# Patient Record
Sex: Female | Born: 1950 | Race: White | Hispanic: No | Smoking: Never smoker
Health system: Southern US, Community
[De-identification: ages and names within clinical notes are randomized; demographics above are authoritative.]

## PROBLEM LIST (undated history)

## (undated) DIAGNOSIS — R0602 Shortness of breath: Secondary | ICD-10-CM

## (undated) DIAGNOSIS — K5792 Diverticulitis of intestine, part unspecified, without perforation or abscess without bleeding: Secondary | ICD-10-CM

## (undated) DIAGNOSIS — B019 Varicella without complication: Secondary | ICD-10-CM

## (undated) DIAGNOSIS — I10 Essential (primary) hypertension: Secondary | ICD-10-CM

## (undated) DIAGNOSIS — R531 Weakness: Secondary | ICD-10-CM

## (undated) DIAGNOSIS — F32A Depression, unspecified: Secondary | ICD-10-CM

## (undated) DIAGNOSIS — R002 Palpitations: Secondary | ICD-10-CM

## (undated) DIAGNOSIS — R0600 Dyspnea, unspecified: Secondary | ICD-10-CM

## (undated) DIAGNOSIS — R42 Dizziness and giddiness: Secondary | ICD-10-CM

## (undated) DIAGNOSIS — R5383 Other fatigue: Secondary | ICD-10-CM

## (undated) DIAGNOSIS — K219 Gastro-esophageal reflux disease without esophagitis: Secondary | ICD-10-CM

## (undated) HISTORY — DX: Essential (primary) hypertension: I10

## (undated) HISTORY — DX: Palpitations: R00.2

## (undated) HISTORY — DX: Gastro-esophageal reflux disease without esophagitis: K21.9

## (undated) HISTORY — DX: Shortness of breath: R06.02

## (undated) HISTORY — DX: Depression, unspecified: F32.A

## (undated) HISTORY — DX: Weakness: R53.1

## (undated) HISTORY — DX: Other fatigue: R53.83

## (undated) HISTORY — DX: Dyspnea, unspecified: R06.00

## (undated) HISTORY — DX: Dizziness and giddiness: R42

## (undated) HISTORY — DX: Varicella without complication: B01.9

## (undated) HISTORY — DX: Diverticulitis of intestine, part unspecified, without perforation or abscess without bleeding: K57.92

---

## 1974-05-19 HISTORY — PX: APPENDECTOMY: SHX54

## 1974-05-19 HISTORY — PX: OTHER SURGICAL HISTORY: SHX169

## 2003-08-06 ENCOUNTER — Emergency Department (HOSPITAL_COMMUNITY): Admission: EM | Admit: 2003-08-06 | Discharge: 2003-08-06 | Payer: Self-pay

## 2004-05-22 ENCOUNTER — Ambulatory Visit (HOSPITAL_COMMUNITY): Admission: RE | Admit: 2004-05-22 | Discharge: 2004-05-22 | Payer: Self-pay | Admitting: Chiropractic Medicine

## 2005-03-09 IMAGING — CR DG ELBOW COMPLETE 3+V*R*
2 series · 2 of 2 positions shown · non-contrast
Comparison: none

CLINICAL DATA: Fall.  Pain over the posterior elbow.

[view not recorded (1 of 2)]
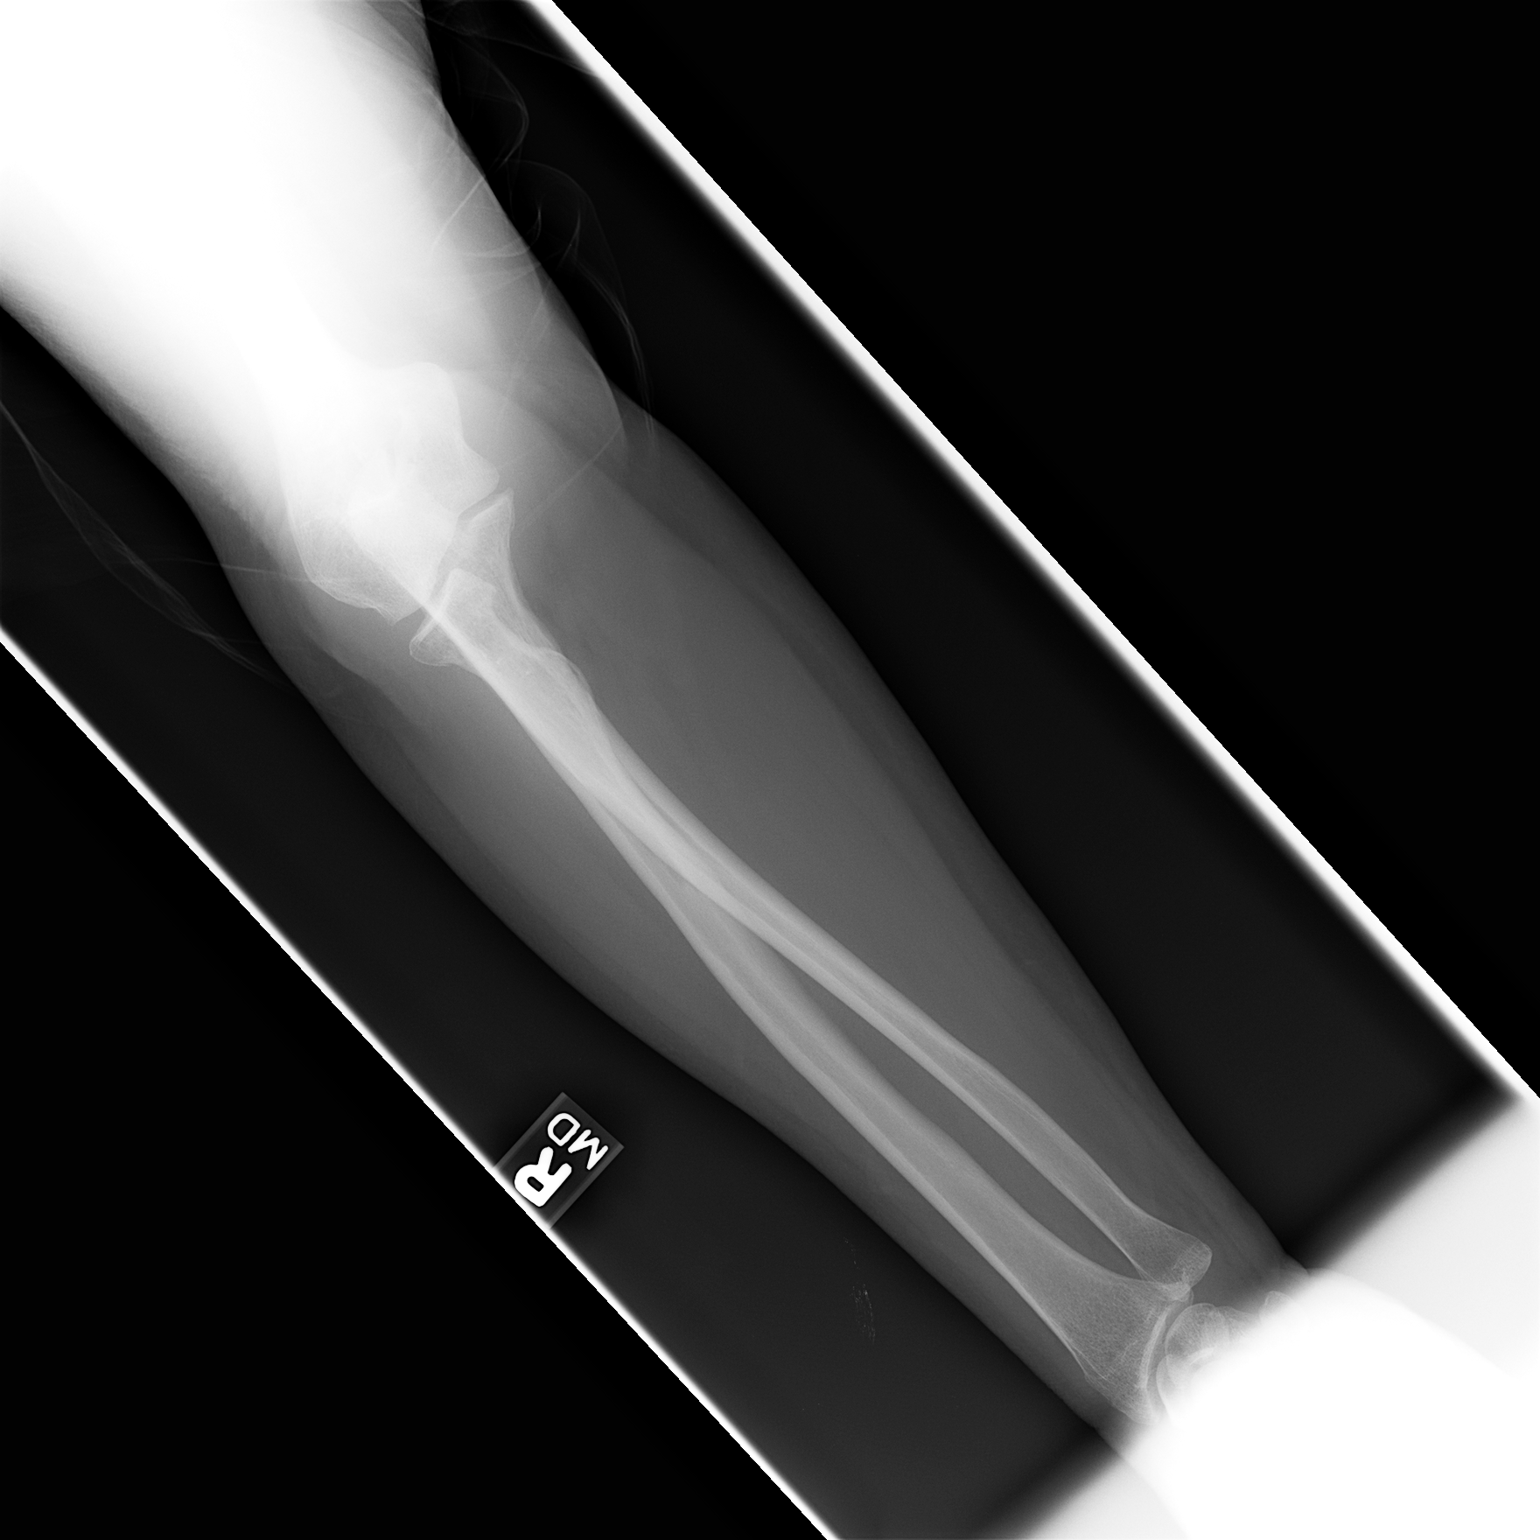

[view not recorded (2 of 2)]
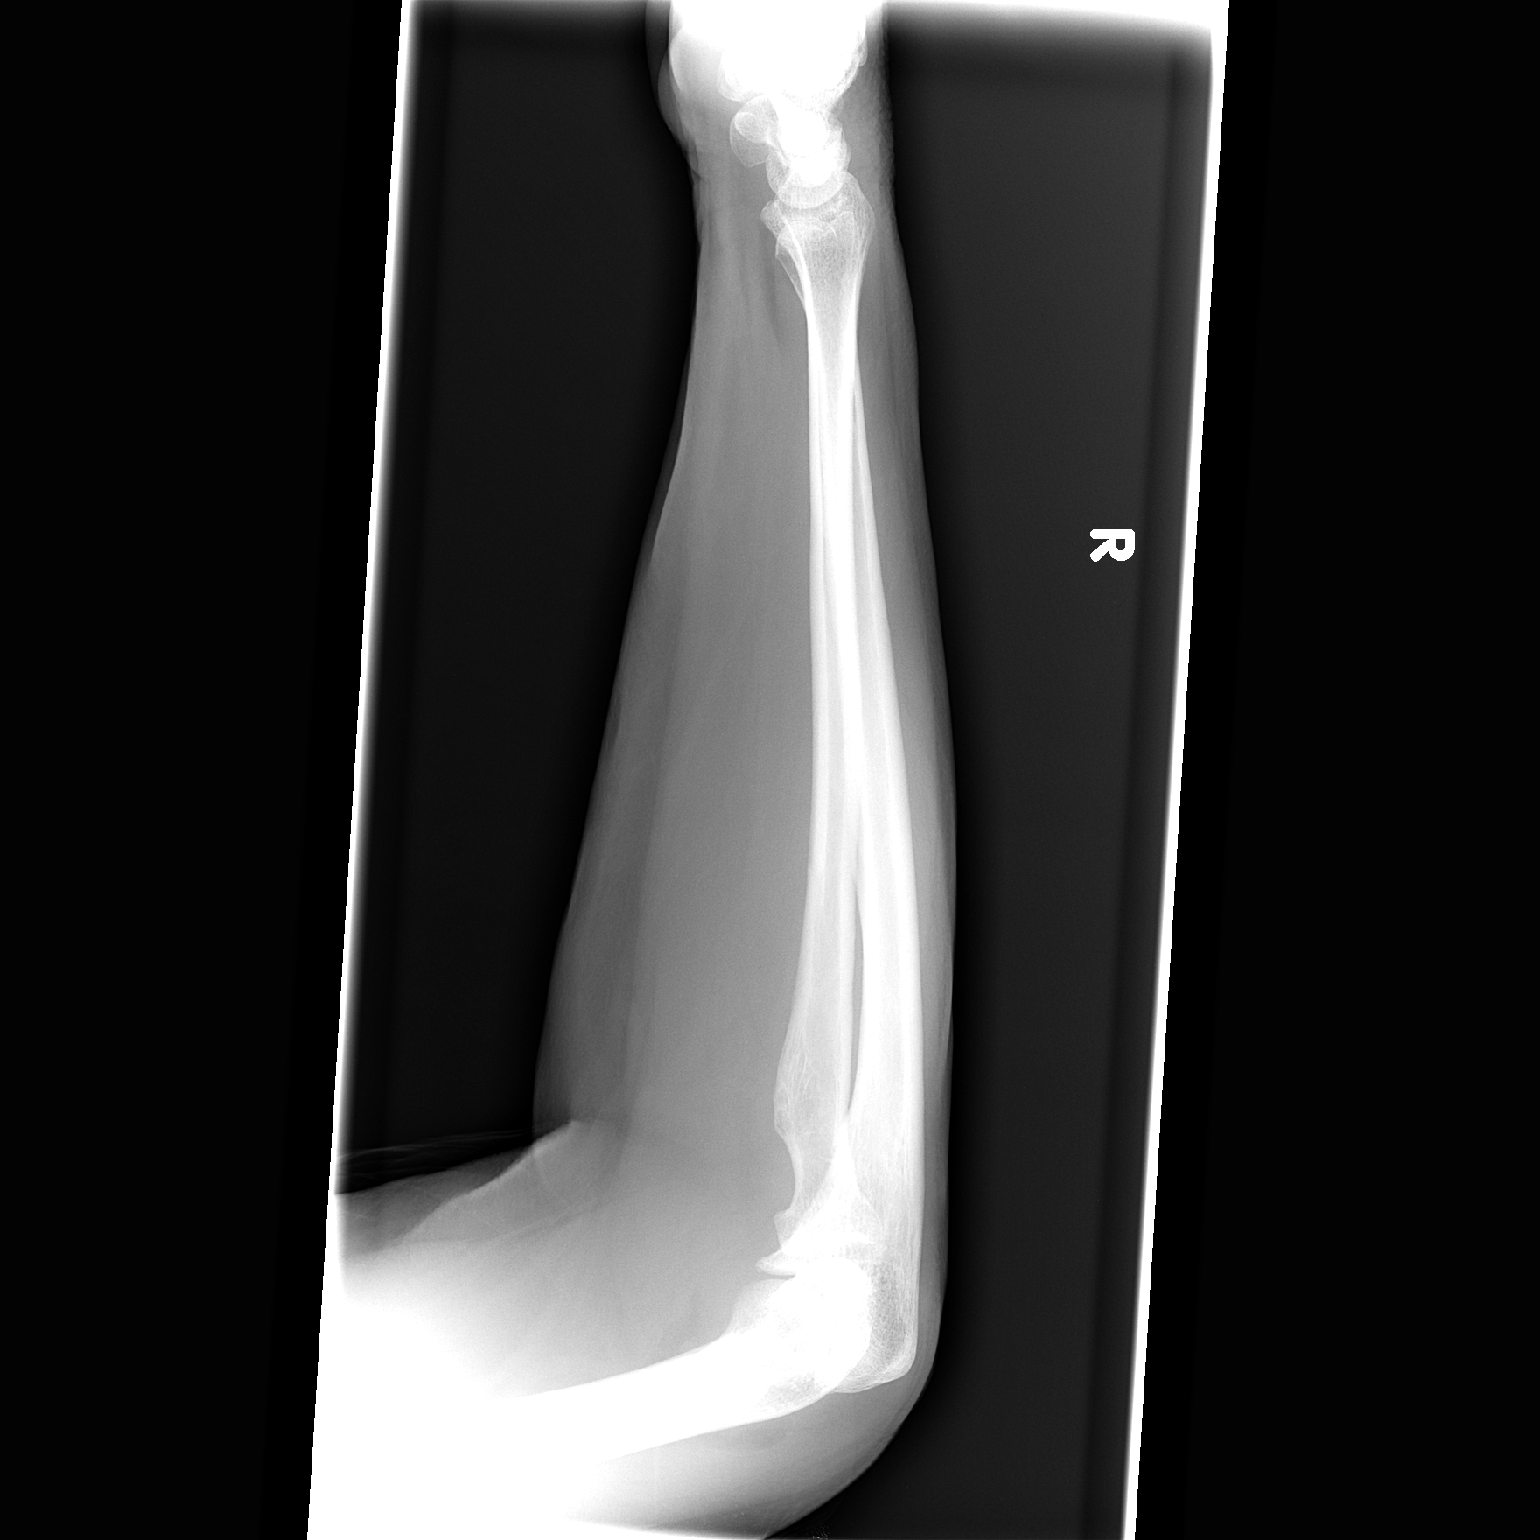

[2 of 2 positions shown; findings below may reference images not displayed]

RIGHT ELBOW
Three view exam of the right elbow was performed with multiple views repeated as the patient was unable to straighten her elbow.  There is a substantial joint effusion with elevation of both the anterior and posterior fat pads.  A nondistracted radial head fracture is apparent.  The distal humerus is not well evaluated secondary to the patient's inability to straighten her elbow. 

IMPRESSION
Radial head fracture with joint effusion. 

Study is limited by the patient's inability to straighten her elbow.  The distal humerus has not been well visualized.  Repeat imaging after pain management may prove helpful to exclude a distal humeral injury. 

RIGHT FOREARM
Two view exam of the right forearm again shows the nondisplaced fracture of the radial head.  No other acute fracture identified. 

IMPRESSION
Radial head fracture.   Please see elbow report above.  

[REDACTED]

## 2019-06-01 ENCOUNTER — Telehealth: Payer: Self-pay

## 2019-06-01 NOTE — Telephone Encounter (Signed)
REFERRAL AND LABS ON FILE FROM [facility]  [facility], SENT REFERRAL TO SCHEDULING

## 2019-06-10 ENCOUNTER — Encounter: Payer: Self-pay | Admitting: Cardiology

## 2019-06-10 ENCOUNTER — Other Ambulatory Visit: Payer: Self-pay

## 2019-06-10 ENCOUNTER — Telehealth: Payer: Self-pay | Admitting: Radiology

## 2019-06-10 ENCOUNTER — Ambulatory Visit: Payer: Medicare HMO | Admitting: Cardiology

## 2019-06-10 VITALS — BP 138/96 | HR 90 | Ht 69.0 in | Wt 229.0 lb

## 2019-06-10 DIAGNOSIS — T733XXA Exhaustion due to excessive exertion, initial encounter: Secondary | ICD-10-CM | POA: Diagnosis not present

## 2019-06-10 DIAGNOSIS — R002 Palpitations: Secondary | ICD-10-CM

## 2019-06-10 DIAGNOSIS — R42 Dizziness and giddiness: Secondary | ICD-10-CM

## 2019-06-10 NOTE — Telephone Encounter (Signed)
Enrolled patient for a 30 day [employer] Event monitor to be mailed to patients home.  

## 2019-06-10 NOTE — Patient Instructions (Signed)
Medication Instructions:  Your physician recommends that you continue on your current medications as directed. Please refer to the Current Medication list given to you today.  *If you need a refill on your cardiac medications before your next appointment, please call your pharmacy*  Testing/Procedures: Your physician has requested that you have an echocardiogram. Echocardiography is a painless test that uses sound waves to create images of your heart. It provides your doctor with information about the size and shape of your heart and how well your heart's chambers and valves are working. This procedure takes approximately one hour. There are no restrictions for this procedure.  Your physician has requested that you have a lexiscan myoview. For further information please visit [email or web address] Please follow instruction sheet, as given.  Your physician has recommended that you wear an event monitor. Event monitors are medical devices that record the heart's electrical activity. Doctors most often [place] these monitors to diagnose arrhythmias. Arrhythmias are problems with the speed or rhythm of the heartbeat. The monitor is a small, portable device. You can wear one while you do your normal daily activities. This is usually used to diagnose what is causing palpitations/syncope (passing out).  Follow-Up: At [facility], you and your health needs are our priority.  As part of our continuing mission to provide you with exceptional heart care, we have created designated Provider Care Teams.  These Care Teams include your primary Cardiologist (physician) and Advanced Practice Providers (APPs -  Physician Assistants and Nurse Practitioners) who all work together to provide you with the care you need, when you need it.  Follow up with Dr. Mayford Knife as needed based on results from testing.

## 2019-06-10 NOTE — Progress Notes (Signed)
Cardiology Office Consult Note    Date:  06/14/2019   ID:  MEGA KINKADE, DOB [birth year], MRN [id number]  PCP:  Primus Bravo, NP  Cardiologist:  Armanda Magic, MD   Chief Complaint  Patient presents with  . New Patient (Initial Visit)    Weakness, palpitations, dizziness    History of Present Illness:  Morgan Potter is a 69 y.o. female who is being seen today for the evaluation of weakness, dizziness and palpitations at the request of Raspet, Erin K, PA-C.  This is a 69yo female with a hx of who is referred for evaluation of episodes of weakness and dizziness.  She tells me that this past summer she had a high fever and felt poorly with weakness and dizziness.  She got a Covid test which was negative.  She had recurrent sx a month later with another covid test neg.  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  Since then she has had episodes intermittently where she gets an overwhelming feeling of weakness and dizziness but has not had syncope.  She does not know if her heart is beating fast or slow.    She recently had an episode where she was taking the trash out to the road and became very weak and dizzy.  She went into the house and sat down and her Bp was in the 80's systolic and lasted like that for an hour.  She had a similar episode in the grocery store.  She denies any CP or pressure, PND, orthopnea, LE edema.  She occasionally will has some DOE but has attributed this to her being overweight.   Past Medical History:  Diagnosis Date  . Dizziness and giddiness   . Dyspnea   . Fatigue   . Lightheadedness   . Palpitations   . SOB (shortness of breath)   . Weakness     Past Surgical History:  Procedure Laterality Date  . APPENDECTOMY  1976  . CESAREAN SECTION  1981  . CESAREAN SECTION  1969  . cholecystectomy  1976    Current Medications: Current Meds  Medication Sig  . aspirin 81 MG EC tablet Take 81 mg by mouth daily.  Marland Kitchen b complex  vitamins tablet Take 1 tablet by mouth daily.  . Calcium Citrate-Vitamin D (CALCIUM + D PO) Take 1 tablet by mouth daily.  . cetirizine (ZYRTEC) 10 MG tablet Take 10 mg by mouth daily.   Marland Kitchen escitalopram (LEXAPRO) 20 MG tablet Take 40 mg by mouth daily.   . Garlic Oil 2 MG CAPS Take 2 mg by mouth daily.  Marland Kitchen LORazepam (ATIVAN) 1 MG tablet TAKE ONE TABLET BY MOUTH 6 HOURS AS NEEDED  . losartan-hydrochlorothiazide (HYZAAR) 50-12.5 MG tablet TAKE 1 TABLET EVERY DAY  . meloxicam (MOBIC) 7.5 MG tablet Take 7.5 mg by mouth daily.   . niacin 500 MG tablet Take 500 mg by mouth daily.   . Omega-3 Fatty Acids (FISH OIL) 1000 MG CAPS Take by mouth daily.   Marland Kitchen zolpidem (AMBIEN) 10 MG tablet Take 10 mg by mouth at bedtime.     Allergies:   Codeine, Morphine and related, and Sulfa antibiotics   Social History   Socioeconomic History  . Marital status: Married    Spouse name: Not on file  . Number of children: Not on file  . Years of education: Not on file  . Highest education level: Not on file  Occupational History  .  Not on file  Tobacco Use  . Smoking status: Never Smoker  . Smokeless tobacco: Never Used  Substance and Sexual Activity  . Alcohol use: Not on file  . Drug use: Not on file  . Sexual activity: Not on file  Other Topics Concern  . Not on file  Social History Narrative  . Not on file   Social Determinants of Health   Financial Resource Strain:   . Difficulty of Paying Living Expenses: Not on file  Food Insecurity:   . Worried About Charity fundraiser in the Last Year: Not on file  . Ran Out of Food in the Last Year: Not on file  Transportation Needs:   . Lack of Transportation (Medical): Not on file  . Lack of Transportation (Non-Medical): Not on file  Physical Activity:   . Days of Exercise per Week: Not on file  . Minutes of Exercise per Session: Not on file  Stress:   . Feeling of Stress : Not on file  Social Connections:   . Frequency of Communication with Friends  and Family: Not on file  . Frequency of Social Gatherings with Friends and Family: Not on file  . Attends Religious Services: Not on file  . Active Member of Clubs or Organizations: Not on file  . Attends Archivist Meetings: Not on file  . Marital Status: Not on file     Family History:  The patient's family history includes Arrhythmia in her mother; Cancer in her father; Heart attack in her father.   ROS:   Please see the history of present illness.    ROS All other systems reviewed and are negative.  No flowsheet data found.   No data found.   PHYSICAL EXAM:   VS:  BP (!) 138/96   Pulse 90   Ht 5\' 9"  (1.753 m)   Wt 229 lb (103.9 kg)   SpO2 95%   BMI 33.82 kg/m     GEN: Well nourished, well developed, in no acute distress  HEENT: normal  Neck: no JVD, carotid bruits, or masses Cardiac: RRR; no murmurs, rubs, or gallops,no edema.  Intact distal pulses bilaterally.  Respiratory:  clear to auscultation bilaterally, normal work of breathing GI: soft, nontender, nondistended, + BS MS: no deformity or atrophy  Skin: warm and dry, no rash Neuro:  Alert and Oriented x 3, Strength and sensation are intact Psych: euthymic mood, full affect  Wt Readings from Last 3 Encounters:  06/10/19 229 lb (103.9 kg)      Studies/Labs Reviewed:   EKG:  EKG is ordered today.  The ekg ordered today demonstrates NSR with IRBBB  Recent Labs: No results found for requested labs within last 8760 hours.   Lipid Panel No results found for: CHOL, TRIG, HDL, CHOLHDL, VLDL, LDLCALC, LDLDIRECT  Additional studies/ records that were reviewed today include:  Office notes from PCP, EKG    ASSESSMENT:    1. Palpitations   2. Dizziness   3. Fatigue due to excessive exertion, initial encounter      PLAN:  In order of problems listed above:  1. Palpitations -she really doesn't feel extra heart beats but has episodes where she gets profoundly weak and has to sit down -I will  get a 30 day event monitor to rule out arrhytmias.  2.  Dizziness/Weakness -She is having episodic sx of profound weakness and dizziness and has to sit down -one episode she noted a SBP in the 80's -This  has been occurring since the summer when she had a prolonged febrile illness and was dx with CMV -EKG is nonischemic -orthostatic BPs are normal in the office today -Diff Dx includes arrhythmias, volume depletion from inadequate hydration (one episode occurred when she had a UTI), orthostatic hypotension, coronary ischemia -will check a 2D echo to assess LVF and rule out valvular heart disease -Lexican myoview to rule out ischemia -30 day event monitor to assess for arrhythmias -I have encouraged her to drink at least 64oz of fluid daily -she will let me know if she has any further episodes  3.  Excessive exertional fatigue -? Coronary ischemia or due to deconditioning and obesity -see above >> will get echo and stress test   Medication Adjustments/Labs and Tests Ordered: Current medicines are reviewed at length with the patient today.  Concerns regarding medicines are outlined above.  Medication changes, Labs and Tests ordered today are listed in the Patient Instructions below.  Patient Instructions  Medication Instructions:  Your physician recommends that you continue on your current medications as directed. Please refer to the Current Medication list given to you today.  *If you need a refill on your cardiac medications before your next appointment, please call your pharmacy*  Testing/Procedures: Your physician has requested that you have an echocardiogram. Echocardiography is a painless test that uses sound waves to create images of your heart. It provides your doctor with information about the size and shape of your heart and how well your heart's chambers and valves are working. This procedure takes approximately one hour. There are no restrictions for this procedure.  Your  physician has requested that you have a lexiscan myoview. For further information please visit [email or web address] Please follow instruction sheet, as given.  Your physician has recommended that you wear an event monitor. Event monitors are medical devices that record the heart's electrical activity. Doctors most often [place] these monitors to diagnose arrhythmias. Arrhythmias are problems with the speed or rhythm of the heartbeat. The monitor is a small, portable device. You can wear one while you do your normal daily activities. This is usually used to diagnose what is causing palpitations/syncope (passing out).  Follow-Up: At [facility], you and your health needs are our priority.  As part of our continuing mission to provide you with exceptional heart care, we have created designated Provider Care Teams.  These Care Teams include your primary Cardiologist (physician) and Advanced Practice Providers (APPs -  Physician Assistants and Nurse Practitioners) who all work together to provide you with the care you need, when you need it.  Follow up with Dr. Mayford Knife as needed based on results from testing.      Signed, Armanda Magic, MD  06/14/2019 2:54 PM    [facility] [address] [place], [place], [place]  [address] Phone: [phone]; Fax: [phone]

## 2019-06-22 ENCOUNTER — Telehealth (HOSPITAL_COMMUNITY): Payer: Self-pay | Admitting: *Deleted

## 2019-06-22 NOTE — Telephone Encounter (Signed)
Patient given detailed instructions per Myocardial Perfusion Study Information Sheet for the test on 06/24/19 at 10:15. Patient notified to arrive 15 minutes early and that it is imperative to arrive on time for appointment to keep from having the test rescheduled.  If you need to cancel or reschedule your appointment, please call the office within 24 hours of your appointment. . Patient verbalized understanding.Morgan Potter

## 2019-06-24 ENCOUNTER — Ambulatory Visit (HOSPITAL_COMMUNITY): Payer: Medicare HMO | Attending: Cardiology

## 2019-06-24 ENCOUNTER — Ambulatory Visit (HOSPITAL_BASED_OUTPATIENT_CLINIC_OR_DEPARTMENT_OTHER): Payer: Medicare HMO

## 2019-06-24 ENCOUNTER — Other Ambulatory Visit: Payer: Self-pay

## 2019-06-24 DIAGNOSIS — I340 Nonrheumatic mitral (valve) insufficiency: Secondary | ICD-10-CM

## 2019-06-24 DIAGNOSIS — R002 Palpitations: Secondary | ICD-10-CM

## 2019-06-24 DIAGNOSIS — R0602 Shortness of breath: Secondary | ICD-10-CM | POA: Diagnosis not present

## 2019-06-24 DIAGNOSIS — T733XXA Exhaustion due to excessive exertion, initial encounter: Secondary | ICD-10-CM | POA: Diagnosis not present

## 2019-06-24 DIAGNOSIS — I080 Rheumatic disorders of both mitral and aortic valves: Secondary | ICD-10-CM | POA: Diagnosis not present

## 2019-06-24 DIAGNOSIS — R42 Dizziness and giddiness: Secondary | ICD-10-CM

## 2019-06-24 DIAGNOSIS — X58XXXA Exposure to other specified factors, initial encounter: Secondary | ICD-10-CM | POA: Insufficient documentation

## 2019-06-24 DIAGNOSIS — R5383 Other fatigue: Secondary | ICD-10-CM | POA: Insufficient documentation

## 2019-06-24 LAB — MYOCARDIAL PERFUSION IMAGING
LV dias vol: 66 mL (ref 46–106)
LV sys vol: 20 mL
Peak HR: 96 {beats}/min
Rest HR: 80 {beats}/min
SDS: 1
SRS: 0
SSS: 1
TID: 0.78

## 2019-06-24 LAB — ECHOCARDIOGRAM COMPLETE
Height: 69 in
Weight: 3664 oz

## 2019-06-24 MED ORDER — REGADENOSON 0.4 MG/5ML IV SOLN
0.4000 mg | Freq: Once | INTRAVENOUS | Status: AC
  Administered 2019-06-24: 0.4 mg via INTRAVENOUS

## 2019-06-24 MED ORDER — TECHNETIUM TC 99M TETROFOSMIN IV KIT
10.0000 | PACK | Freq: Once | INTRAVENOUS | Status: AC | PRN
  Administered 2019-06-24: 10 via INTRAVENOUS
  Filled 2019-06-24: qty 10

## 2019-06-24 MED ORDER — TECHNETIUM TC 99M TETROFOSMIN IV KIT
31.1000 | PACK | Freq: Once | INTRAVENOUS | Status: AC | PRN
  Administered 2019-06-24: 31.1 via INTRAVENOUS
  Filled 2019-06-24: qty 32

## 2019-06-30 ENCOUNTER — Ambulatory Visit: Payer: Self-pay | Admitting: Cardiology

## 2019-07-11 ENCOUNTER — Encounter (INDEPENDENT_AMBULATORY_CARE_PROVIDER_SITE_OTHER): Payer: Medicare HMO

## 2019-07-11 DIAGNOSIS — T733XXA Exhaustion due to excessive exertion, initial encounter: Secondary | ICD-10-CM | POA: Diagnosis not present

## 2019-07-11 DIAGNOSIS — R002 Palpitations: Secondary | ICD-10-CM

## 2019-07-11 DIAGNOSIS — R42 Dizziness and giddiness: Secondary | ICD-10-CM | POA: Diagnosis not present

## 2022-08-07 DIAGNOSIS — I10 Essential (primary) hypertension: Secondary | ICD-10-CM | POA: Diagnosis not present

## 2022-08-07 DIAGNOSIS — Z5181 Encounter for therapeutic drug level monitoring: Secondary | ICD-10-CM | POA: Diagnosis not present

## 2022-08-07 DIAGNOSIS — E782 Mixed hyperlipidemia: Secondary | ICD-10-CM | POA: Diagnosis not present

## 2022-08-11 DIAGNOSIS — F1320 Sedative, hypnotic or anxiolytic dependence, uncomplicated: Secondary | ICD-10-CM | POA: Diagnosis not present

## 2022-08-11 DIAGNOSIS — I10 Essential (primary) hypertension: Secondary | ICD-10-CM | POA: Diagnosis not present

## 2022-08-11 DIAGNOSIS — F3342 Major depressive disorder, recurrent, in full remission: Secondary | ICD-10-CM | POA: Diagnosis not present

## 2022-08-11 DIAGNOSIS — E782 Mixed hyperlipidemia: Secondary | ICD-10-CM | POA: Diagnosis not present

## 2022-08-11 DIAGNOSIS — K219 Gastro-esophageal reflux disease without esophagitis: Secondary | ICD-10-CM | POA: Diagnosis not present

## 2022-08-11 DIAGNOSIS — E669 Obesity, unspecified: Secondary | ICD-10-CM | POA: Diagnosis not present

## 2022-08-11 DIAGNOSIS — J301 Allergic rhinitis due to pollen: Secondary | ICD-10-CM | POA: Diagnosis not present

## 2022-08-14 DIAGNOSIS — I10 Essential (primary) hypertension: Secondary | ICD-10-CM | POA: Diagnosis not present

## 2022-08-14 DIAGNOSIS — Z6833 Body mass index (BMI) 33.0-33.9, adult: Secondary | ICD-10-CM | POA: Diagnosis not present

## 2022-08-14 DIAGNOSIS — Z713 Dietary counseling and surveillance: Secondary | ICD-10-CM | POA: Diagnosis not present

## 2022-08-14 DIAGNOSIS — E782 Mixed hyperlipidemia: Secondary | ICD-10-CM | POA: Diagnosis not present

## 2022-08-14 DIAGNOSIS — E6609 Other obesity due to excess calories: Secondary | ICD-10-CM | POA: Diagnosis not present

## 2022-09-15 DIAGNOSIS — Z6833 Body mass index (BMI) 33.0-33.9, adult: Secondary | ICD-10-CM | POA: Diagnosis not present

## 2022-09-15 DIAGNOSIS — Z713 Dietary counseling and surveillance: Secondary | ICD-10-CM | POA: Diagnosis not present

## 2022-09-15 DIAGNOSIS — E782 Mixed hyperlipidemia: Secondary | ICD-10-CM | POA: Diagnosis not present

## 2022-09-15 DIAGNOSIS — E6609 Other obesity due to excess calories: Secondary | ICD-10-CM | POA: Diagnosis not present

## 2022-09-24 DIAGNOSIS — Z6833 Body mass index (BMI) 33.0-33.9, adult: Secondary | ICD-10-CM | POA: Diagnosis not present

## 2022-09-24 DIAGNOSIS — M25561 Pain in right knee: Secondary | ICD-10-CM | POA: Diagnosis not present

## 2022-09-24 DIAGNOSIS — M1711 Unilateral primary osteoarthritis, right knee: Secondary | ICD-10-CM | POA: Diagnosis not present

## 2022-09-24 DIAGNOSIS — E6609 Other obesity due to excess calories: Secondary | ICD-10-CM | POA: Diagnosis not present

## 2022-10-07 DIAGNOSIS — R52 Pain, unspecified: Secondary | ICD-10-CM | POA: Diagnosis not present

## 2022-10-07 DIAGNOSIS — S39012A Strain of muscle, fascia and tendon of lower back, initial encounter: Secondary | ICD-10-CM | POA: Diagnosis not present

## 2022-10-20 DIAGNOSIS — G8929 Other chronic pain: Secondary | ICD-10-CM | POA: Diagnosis not present

## 2022-10-20 DIAGNOSIS — M545 Low back pain, unspecified: Secondary | ICD-10-CM | POA: Diagnosis not present

## 2022-10-20 DIAGNOSIS — M6281 Muscle weakness (generalized): Secondary | ICD-10-CM | POA: Diagnosis not present

## 2022-10-20 DIAGNOSIS — M6289 Other specified disorders of muscle: Secondary | ICD-10-CM | POA: Diagnosis not present

## 2022-10-22 DIAGNOSIS — L578 Other skin changes due to chronic exposure to nonionizing radiation: Secondary | ICD-10-CM | POA: Diagnosis not present

## 2022-10-22 DIAGNOSIS — L821 Other seborrheic keratosis: Secondary | ICD-10-CM | POA: Diagnosis not present

## 2022-10-22 DIAGNOSIS — L219 Seborrheic dermatitis, unspecified: Secondary | ICD-10-CM | POA: Diagnosis not present

## 2022-10-22 DIAGNOSIS — L820 Inflamed seborrheic keratosis: Secondary | ICD-10-CM | POA: Diagnosis not present

## 2023-01-02 DIAGNOSIS — M1711 Unilateral primary osteoarthritis, right knee: Secondary | ICD-10-CM | POA: Diagnosis not present

## 2023-01-23 DIAGNOSIS — M1711 Unilateral primary osteoarthritis, right knee: Secondary | ICD-10-CM | POA: Diagnosis not present

## 2023-01-28 DIAGNOSIS — M1711 Unilateral primary osteoarthritis, right knee: Secondary | ICD-10-CM | POA: Diagnosis not present

## 2023-02-04 DIAGNOSIS — E782 Mixed hyperlipidemia: Secondary | ICD-10-CM | POA: Diagnosis not present

## 2023-02-11 DIAGNOSIS — E782 Mixed hyperlipidemia: Secondary | ICD-10-CM | POA: Diagnosis not present

## 2023-02-11 DIAGNOSIS — I10 Essential (primary) hypertension: Secondary | ICD-10-CM | POA: Diagnosis not present

## 2023-02-11 DIAGNOSIS — Z23 Encounter for immunization: Secondary | ICD-10-CM | POA: Diagnosis not present

## 2023-02-11 DIAGNOSIS — Z6833 Body mass index (BMI) 33.0-33.9, adult: Secondary | ICD-10-CM | POA: Diagnosis not present

## 2023-02-18 DIAGNOSIS — M1711 Unilateral primary osteoarthritis, right knee: Secondary | ICD-10-CM | POA: Diagnosis not present

## 2023-03-03 DIAGNOSIS — M1711 Unilateral primary osteoarthritis, right knee: Secondary | ICD-10-CM | POA: Diagnosis not present

## 2023-03-06 DIAGNOSIS — H25812 Combined forms of age-related cataract, left eye: Secondary | ICD-10-CM | POA: Diagnosis not present

## 2023-03-12 DIAGNOSIS — B9689 Other specified bacterial agents as the cause of diseases classified elsewhere: Secondary | ICD-10-CM | POA: Diagnosis not present

## 2023-03-12 DIAGNOSIS — J208 Acute bronchitis due to other specified organisms: Secondary | ICD-10-CM | POA: Diagnosis not present

## 2023-03-17 DIAGNOSIS — M1711 Unilateral primary osteoarthritis, right knee: Secondary | ICD-10-CM | POA: Diagnosis not present

## 2023-03-19 DIAGNOSIS — M1711 Unilateral primary osteoarthritis, right knee: Secondary | ICD-10-CM | POA: Diagnosis not present

## 2023-03-23 DIAGNOSIS — M1711 Unilateral primary osteoarthritis, right knee: Secondary | ICD-10-CM | POA: Diagnosis not present

## 2023-03-30 DIAGNOSIS — M170 Bilateral primary osteoarthritis of knee: Secondary | ICD-10-CM | POA: Diagnosis not present

## 2023-03-30 DIAGNOSIS — I10 Essential (primary) hypertension: Secondary | ICD-10-CM | POA: Diagnosis not present

## 2023-03-30 DIAGNOSIS — Z791 Long term (current) use of non-steroidal anti-inflammatories (NSAID): Secondary | ICD-10-CM | POA: Diagnosis not present

## 2023-04-01 DIAGNOSIS — M1711 Unilateral primary osteoarthritis, right knee: Secondary | ICD-10-CM | POA: Diagnosis not present

## 2023-04-07 DIAGNOSIS — F331 Major depressive disorder, recurrent, moderate: Secondary | ICD-10-CM | POA: Diagnosis not present

## 2023-04-07 DIAGNOSIS — F411 Generalized anxiety disorder: Secondary | ICD-10-CM | POA: Diagnosis not present

## 2023-04-09 DIAGNOSIS — H35372 Puckering of macula, left eye: Secondary | ICD-10-CM | POA: Diagnosis not present

## 2023-04-09 DIAGNOSIS — H527 Unspecified disorder of refraction: Secondary | ICD-10-CM | POA: Diagnosis not present

## 2023-04-09 DIAGNOSIS — H26491 Other secondary cataract, right eye: Secondary | ICD-10-CM | POA: Diagnosis not present

## 2023-04-09 DIAGNOSIS — H43813 Vitreous degeneration, bilateral: Secondary | ICD-10-CM | POA: Diagnosis not present

## 2023-04-09 DIAGNOSIS — H25812 Combined forms of age-related cataract, left eye: Secondary | ICD-10-CM | POA: Diagnosis not present

## 2023-04-25 ENCOUNTER — Inpatient Hospital Stay (HOSPITAL_COMMUNITY): Payer: Medicare Other

## 2023-04-25 ENCOUNTER — Other Ambulatory Visit: Payer: Self-pay

## 2023-04-25 ENCOUNTER — Emergency Department (HOSPITAL_COMMUNITY): Payer: Medicare Other

## 2023-04-25 ENCOUNTER — Encounter (HOSPITAL_COMMUNITY): Payer: Self-pay

## 2023-04-25 ENCOUNTER — Inpatient Hospital Stay (HOSPITAL_COMMUNITY)
Admission: EM | Admit: 2023-04-25 | Discharge: 2023-05-02 | DRG: 493 | Disposition: A | Discharge status: Rehabilitation Facility | Payer: Medicare Other | Attending: Internal Medicine | Admitting: Internal Medicine

## 2023-04-25 DIAGNOSIS — M778 Other enthesopathies, not elsewhere classified: Secondary | ICD-10-CM | POA: Diagnosis not present

## 2023-04-25 DIAGNOSIS — E871 Hypo-osmolality and hyponatremia: Secondary | ICD-10-CM | POA: Diagnosis present

## 2023-04-25 DIAGNOSIS — M19012 Primary osteoarthritis, left shoulder: Secondary | ICD-10-CM | POA: Diagnosis not present

## 2023-04-25 DIAGNOSIS — Z043 Encounter for examination and observation following other accident: Secondary | ICD-10-CM | POA: Diagnosis not present

## 2023-04-25 DIAGNOSIS — K5901 Slow transit constipation: Secondary | ICD-10-CM | POA: Diagnosis not present

## 2023-04-25 DIAGNOSIS — M79601 Pain in right arm: Secondary | ICD-10-CM | POA: Diagnosis not present

## 2023-04-25 DIAGNOSIS — I10 Essential (primary) hypertension: Secondary | ICD-10-CM | POA: Diagnosis not present

## 2023-04-25 DIAGNOSIS — D72829 Elevated white blood cell count, unspecified: Secondary | ICD-10-CM | POA: Diagnosis present

## 2023-04-25 DIAGNOSIS — W010XXD Fall on same level from slipping, tripping and stumbling without subsequent striking against object, subsequent encounter: Secondary | ICD-10-CM | POA: Diagnosis not present

## 2023-04-25 DIAGNOSIS — S199XXA Unspecified injury of neck, initial encounter: Secondary | ICD-10-CM | POA: Diagnosis not present

## 2023-04-25 DIAGNOSIS — M79602 Pain in left arm: Secondary | ICD-10-CM | POA: Diagnosis not present

## 2023-04-25 DIAGNOSIS — Z882 Allergy status to sulfonamides status: Secondary | ICD-10-CM | POA: Diagnosis not present

## 2023-04-25 DIAGNOSIS — S42252A Displaced fracture of greater tuberosity of left humerus, initial encounter for closed fracture: Secondary | ICD-10-CM | POA: Diagnosis not present

## 2023-04-25 DIAGNOSIS — Z7982 Long term (current) use of aspirin: Secondary | ICD-10-CM | POA: Diagnosis not present

## 2023-04-25 DIAGNOSIS — Z885 Allergy status to narcotic agent status: Secondary | ICD-10-CM

## 2023-04-25 DIAGNOSIS — Z531 Procedure and treatment not carried out because of patient's decision for reasons of belief and group pressure: Secondary | ICD-10-CM | POA: Diagnosis not present

## 2023-04-25 DIAGNOSIS — K5900 Constipation, unspecified: Secondary | ICD-10-CM | POA: Diagnosis not present

## 2023-04-25 DIAGNOSIS — Z8249 Family history of ischemic heart disease and other diseases of the circulatory system: Secondary | ICD-10-CM | POA: Diagnosis not present

## 2023-04-25 DIAGNOSIS — S42391A Other fracture of shaft of right humerus, initial encounter for closed fracture: Secondary | ICD-10-CM | POA: Diagnosis not present

## 2023-04-25 DIAGNOSIS — Y92014 Private driveway to single-family (private) house as the place of occurrence of the external cause: Secondary | ICD-10-CM | POA: Diagnosis not present

## 2023-04-25 DIAGNOSIS — M25412 Effusion, left shoulder: Secondary | ICD-10-CM | POA: Diagnosis not present

## 2023-04-25 DIAGNOSIS — E66811 Obesity, class 1: Secondary | ICD-10-CM | POA: Diagnosis present

## 2023-04-25 DIAGNOSIS — W19XXXA Unspecified fall, initial encounter: Secondary | ICD-10-CM | POA: Diagnosis not present

## 2023-04-25 DIAGNOSIS — Z789 Other specified health status: Secondary | ICD-10-CM | POA: Diagnosis present

## 2023-04-25 DIAGNOSIS — S82002D Unspecified fracture of left patella, subsequent encounter for closed fracture with routine healing: Secondary | ICD-10-CM | POA: Diagnosis not present

## 2023-04-25 DIAGNOSIS — G8918 Other acute postprocedural pain: Secondary | ICD-10-CM | POA: Diagnosis not present

## 2023-04-25 DIAGNOSIS — S42331A Displaced oblique fracture of shaft of humerus, right arm, initial encounter for closed fracture: Secondary | ICD-10-CM | POA: Diagnosis not present

## 2023-04-25 DIAGNOSIS — S42201A Unspecified fracture of upper end of right humerus, initial encounter for closed fracture: Secondary | ICD-10-CM | POA: Diagnosis not present

## 2023-04-25 DIAGNOSIS — S82002A Unspecified fracture of left patella, initial encounter for closed fracture: Secondary | ICD-10-CM | POA: Diagnosis present

## 2023-04-25 DIAGNOSIS — L299 Pruritus, unspecified: Secondary | ICD-10-CM | POA: Diagnosis not present

## 2023-04-25 DIAGNOSIS — T07XXXA Unspecified multiple injuries, initial encounter: Secondary | ICD-10-CM | POA: Diagnosis not present

## 2023-04-25 DIAGNOSIS — D62 Acute posthemorrhagic anemia: Secondary | ICD-10-CM | POA: Diagnosis not present

## 2023-04-25 DIAGNOSIS — D649 Anemia, unspecified: Secondary | ICD-10-CM | POA: Diagnosis not present

## 2023-04-25 DIAGNOSIS — S82035A Nondisplaced transverse fracture of left patella, initial encounter for closed fracture: Secondary | ICD-10-CM | POA: Diagnosis not present

## 2023-04-25 DIAGNOSIS — K219 Gastro-esophageal reflux disease without esophagitis: Secondary | ICD-10-CM | POA: Diagnosis present

## 2023-04-25 DIAGNOSIS — S42292A Other displaced fracture of upper end of left humerus, initial encounter for closed fracture: Secondary | ICD-10-CM | POA: Diagnosis present

## 2023-04-25 DIAGNOSIS — M25562 Pain in left knee: Secondary | ICD-10-CM | POA: Diagnosis not present

## 2023-04-25 DIAGNOSIS — S42302A Unspecified fracture of shaft of humerus, left arm, initial encounter for closed fracture: Secondary | ICD-10-CM | POA: Diagnosis not present

## 2023-04-25 DIAGNOSIS — Z79899 Other long term (current) drug therapy: Secondary | ICD-10-CM | POA: Diagnosis not present

## 2023-04-25 DIAGNOSIS — E669 Obesity, unspecified: Secondary | ICD-10-CM | POA: Diagnosis present

## 2023-04-25 DIAGNOSIS — S42215A Unspecified nondisplaced fracture of surgical neck of left humerus, initial encounter for closed fracture: Secondary | ICD-10-CM | POA: Diagnosis present

## 2023-04-25 DIAGNOSIS — D1809 Hemangioma of other sites: Secondary | ICD-10-CM | POA: Diagnosis not present

## 2023-04-25 DIAGNOSIS — M19011 Primary osteoarthritis, right shoulder: Secondary | ICD-10-CM | POA: Diagnosis not present

## 2023-04-25 DIAGNOSIS — Z9049 Acquired absence of other specified parts of digestive tract: Secondary | ICD-10-CM | POA: Diagnosis not present

## 2023-04-25 DIAGNOSIS — Y92009 Unspecified place in unspecified non-institutional (private) residence as the place of occurrence of the external cause: Principal | ICD-10-CM

## 2023-04-25 DIAGNOSIS — Z6833 Body mass index (BMI) 33.0-33.9, adult: Secondary | ICD-10-CM | POA: Diagnosis not present

## 2023-04-25 DIAGNOSIS — S01111A Laceration without foreign body of right eyelid and periocular area, initial encounter: Secondary | ICD-10-CM | POA: Diagnosis not present

## 2023-04-25 DIAGNOSIS — M25561 Pain in right knee: Secondary | ICD-10-CM | POA: Diagnosis not present

## 2023-04-25 DIAGNOSIS — F419 Anxiety disorder, unspecified: Secondary | ICD-10-CM | POA: Diagnosis not present

## 2023-04-25 DIAGNOSIS — W010XXA Fall on same level from slipping, tripping and stumbling without subsequent striking against object, initial encounter: Secondary | ICD-10-CM | POA: Diagnosis present

## 2023-04-25 DIAGNOSIS — E785 Hyperlipidemia, unspecified: Secondary | ICD-10-CM | POA: Diagnosis not present

## 2023-04-25 DIAGNOSIS — F32A Depression, unspecified: Secondary | ICD-10-CM | POA: Diagnosis present

## 2023-04-25 DIAGNOSIS — T402X5A Adverse effect of other opioids, initial encounter: Secondary | ICD-10-CM | POA: Diagnosis not present

## 2023-04-25 DIAGNOSIS — F418 Other specified anxiety disorders: Secondary | ICD-10-CM | POA: Diagnosis not present

## 2023-04-25 DIAGNOSIS — S42351A Displaced comminuted fracture of shaft of humerus, right arm, initial encounter for closed fracture: Principal | ICD-10-CM | POA: Diagnosis present

## 2023-04-25 DIAGNOSIS — M79603 Pain in arm, unspecified: Secondary | ICD-10-CM | POA: Diagnosis not present

## 2023-04-25 DIAGNOSIS — S42292D Other displaced fracture of upper end of left humerus, subsequent encounter for fracture with routine healing: Secondary | ICD-10-CM | POA: Diagnosis not present

## 2023-04-25 DIAGNOSIS — M159 Polyosteoarthritis, unspecified: Secondary | ICD-10-CM | POA: Diagnosis present

## 2023-04-25 DIAGNOSIS — S42301A Unspecified fracture of shaft of humerus, right arm, initial encounter for closed fracture: Secondary | ICD-10-CM | POA: Diagnosis not present

## 2023-04-25 DIAGNOSIS — S01111D Laceration without foreign body of right eyelid and periocular area, subsequent encounter: Secondary | ICD-10-CM | POA: Diagnosis not present

## 2023-04-25 DIAGNOSIS — S0990XA Unspecified injury of head, initial encounter: Secondary | ICD-10-CM | POA: Diagnosis not present

## 2023-04-25 DIAGNOSIS — R609 Edema, unspecified: Secondary | ICD-10-CM | POA: Diagnosis not present

## 2023-04-25 LAB — CBC WITH DIFFERENTIAL/PLATELET
Abs Immature Granulocytes: 0.05 10*3/uL (ref 0.00–0.07)
Basophils Absolute: 0.1 10*3/uL (ref 0.0–0.1)
Basophils Relative: 1 %
Eosinophils Absolute: 0.1 10*3/uL (ref 0.0–0.5)
Eosinophils Relative: 1 %
HCT: 36.1 % (ref 36.0–46.0)
Hemoglobin: 11.9 g/dL — ABNORMAL LOW (ref 12.0–15.0)
Immature Granulocytes: 0 %
Lymphocytes Relative: 13 %
Lymphs Abs: 1.6 10*3/uL (ref 0.7–4.0)
MCH: 30 pg (ref 26.0–34.0)
MCHC: 33 g/dL (ref 30.0–36.0)
MCV: 90.9 fL (ref 80.0–100.0)
Monocytes Absolute: 0.8 10*3/uL (ref 0.1–1.0)
Monocytes Relative: 7 %
Neutro Abs: 9.9 10*3/uL — ABNORMAL HIGH (ref 1.7–7.7)
Neutrophils Relative %: 78 %
Platelets: 314 10*3/uL (ref 150–400)
RBC: 3.97 MIL/uL (ref 3.87–5.11)
RDW: 15 % (ref 11.5–15.5)
WBC: 12.5 10*3/uL — ABNORMAL HIGH (ref 4.0–10.5)
nRBC: 0 % (ref 0.0–0.2)

## 2023-04-25 LAB — BASIC METABOLIC PANEL
Anion gap: 12 (ref 5–15)
BUN: 20 mg/dL (ref 8–23)
CO2: 24 mmol/L (ref 22–32)
Calcium: 9.6 mg/dL (ref 8.9–10.3)
Chloride: 102 mmol/L (ref 98–111)
Creatinine, Ser: 0.94 mg/dL (ref 0.44–1.00)
GFR, Estimated: >60 mL/min (ref >60)
Glucose, Bld: 122 mg/dL — ABNORMAL HIGH (ref 70–99)
Potassium: 4.2 mmol/L (ref 3.5–5.1)
Sodium: 138 mmol/L (ref 135–145)

## 2023-04-25 LAB — PROTIME-INR
INR: 1 (ref 0.8–1.2)
Prothrombin Time: 13.8 s (ref 11.4–15.2)

## 2023-04-25 LAB — ABO/RH: ABO/RH(D): A POS

## 2023-04-25 MED ORDER — HYDROCHLOROTHIAZIDE 12.5 MG PO TABS
12.5000 mg | ORAL_TABLET | Freq: Every day | ORAL | Status: DC
  Administered 2023-04-26 – 2023-05-02 (×6): 12.5 mg via ORAL
  Filled 2023-04-25 (×6): qty 1

## 2023-04-25 MED ORDER — LOSARTAN POTASSIUM-HCTZ 50-12.5 MG PO TABS: 1.0000 | ORAL_TABLET | Freq: Every day | ORAL | Status: DC

## 2023-04-25 MED ORDER — LORAZEPAM 1 MG PO TABS
1.0000 mg | ORAL_TABLET | Freq: Two times a day (BID) | ORAL | Status: DC | PRN
  Administered 2023-04-25 – 2023-05-02 (×12): 1 mg via ORAL
  Filled 2023-04-25 (×13): qty 1

## 2023-04-25 MED ORDER — PRAVASTATIN SODIUM 40 MG PO TABS
20.0000 mg | ORAL_TABLET | Freq: Every day | ORAL | Status: DC
  Administered 2023-04-25 – 2023-05-01 (×7): 20 mg via ORAL
  Filled 2023-04-25 (×7): qty 1

## 2023-04-25 MED ORDER — HYDRALAZINE HCL 20 MG/ML IJ SOLN: 10.0000 mg | INTRAMUSCULAR | Status: DC | PRN

## 2023-04-25 MED ORDER — ENOXAPARIN SODIUM 40 MG/0.4ML IJ SOSY
40.0000 mg | PREFILLED_SYRINGE | INTRAMUSCULAR | Status: DC
  Administered 2023-04-25 – 2023-05-01 (×7): 40 mg via SUBCUTANEOUS
  Filled 2023-04-25 (×7): qty 0.4

## 2023-04-25 MED ORDER — FERROUS SULFATE 325 (65 FE) MG PO TABS
325.0000 mg | ORAL_TABLET | Freq: Every day | ORAL | Status: DC
  Administered 2023-04-26 – 2023-05-02 (×6): 325 mg via ORAL
  Filled 2023-04-25 (×6): qty 1

## 2023-04-25 MED ORDER — SODIUM CHLORIDE 0.9 % IV SOLN: INTRAVENOUS | Status: DC

## 2023-04-25 MED ORDER — ACETAMINOPHEN 500 MG PO TABS
1000.0000 mg | ORAL_TABLET | Freq: Once | ORAL | Status: AC
  Administered 2023-04-25: 1000 mg via ORAL
  Filled 2023-04-25: qty 2

## 2023-04-25 MED ORDER — TRAMADOL HCL 50 MG PO TABS
50.0000 mg | ORAL_TABLET | Freq: Four times a day (QID) | ORAL | Status: DC | PRN
  Administered 2023-04-25 – 2023-04-27 (×5): 50 mg via ORAL
  Filled 2023-04-25 (×6): qty 1

## 2023-04-25 MED ORDER — PANTOPRAZOLE SODIUM 40 MG PO TBEC
40.0000 mg | DELAYED_RELEASE_TABLET | Freq: Every day | ORAL | Status: DC
  Administered 2023-04-26 – 2023-05-02 (×7): 40 mg via ORAL
  Filled 2023-04-25 (×7): qty 1

## 2023-04-25 MED ORDER — FENTANYL CITRATE PF 50 MCG/ML IJ SOSY
25.0000 ug | PREFILLED_SYRINGE | INTRAMUSCULAR | Status: DC | PRN
  Administered 2023-04-25: 25 ug via INTRAVENOUS
  Filled 2023-04-25: qty 1

## 2023-04-25 MED ORDER — DICLOFENAC SODIUM 75 MG PO TBEC
75.0000 mg | DELAYED_RELEASE_TABLET | Freq: Two times a day (BID) | ORAL | Status: DC
  Administered 2023-04-25 – 2023-05-02 (×14): 75 mg via ORAL
  Filled 2023-04-25 (×14): qty 1

## 2023-04-25 MED ORDER — LOSARTAN POTASSIUM 50 MG PO TABS
50.0000 mg | ORAL_TABLET | Freq: Every day | ORAL | Status: DC
  Administered 2023-04-26 – 2023-05-02 (×6): 50 mg via ORAL
  Filled 2023-04-25 (×6): qty 1

## 2023-04-25 MED ORDER — FENTANYL CITRATE PF 50 MCG/ML IJ SOSY
25.0000 ug | PREFILLED_SYRINGE | INTRAMUSCULAR | Status: DC | PRN
  Administered 2023-04-25 – 2023-04-28 (×6): 25 ug via INTRAVENOUS
  Filled 2023-04-25 (×7): qty 1

## 2023-04-25 MED ORDER — IBUPROFEN 200 MG PO TABS
400.0000 mg | ORAL_TABLET | Freq: Four times a day (QID) | ORAL | Status: DC | PRN
  Administered 2023-04-27: 400 mg via ORAL
  Filled 2023-04-25: qty 2

## 2023-04-25 MED ORDER — ACETAMINOPHEN 325 MG PO TABS
650.0000 mg | ORAL_TABLET | Freq: Four times a day (QID) | ORAL | Status: DC | PRN
  Administered 2023-04-25 – 2023-04-27 (×3): 650 mg via ORAL
  Filled 2023-04-25 (×3): qty 2

## 2023-04-25 MED ORDER — ACETAMINOPHEN 325 MG PO TABS: 650.0000 mg | ORAL_TABLET | Freq: Four times a day (QID) | ORAL | Status: DC | PRN

## 2023-04-25 MED ORDER — ESCITALOPRAM OXALATE 10 MG PO TABS
20.0000 mg | ORAL_TABLET | Freq: Every day | ORAL | Status: DC
  Administered 2023-04-26 – 2023-05-02 (×7): 20 mg via ORAL
  Filled 2023-04-25 (×7): qty 2

## 2023-04-25 MED ORDER — FENTANYL CITRATE PF 50 MCG/ML IJ SOSY
25.0000 ug | PREFILLED_SYRINGE | Freq: Once | INTRAMUSCULAR | Status: AC
  Administered 2023-04-25: 25 ug via INTRAVENOUS
  Filled 2023-04-25: qty 1

## 2023-04-25 MED ORDER — LIDOCAINE HCL (PF) 1 % IJ SOLN
5.0000 mL | Freq: Once | INTRAMUSCULAR | Status: AC
  Administered 2023-04-25: 5 mL
  Filled 2023-04-25: qty 5

## 2023-04-25 MED ORDER — SENNOSIDES-DOCUSATE SODIUM 8.6-50 MG PO TABS
1.0000 | ORAL_TABLET | Freq: Every evening | ORAL | Status: DC | PRN
  Filled 2023-04-25: qty 1

## 2023-04-25 MED ORDER — BUPROPION HCL ER (XL) 150 MG PO TB24
300.0000 mg | ORAL_TABLET | Freq: Every day | ORAL | Status: DC
  Administered 2023-04-26 – 2023-05-02 (×7): 300 mg via ORAL
  Filled 2023-04-25 (×7): qty 2

## 2023-04-25 NOTE — ED Triage Notes (Signed)
Pt BIB [facility] from home d/t fall. Pt was on the way to her driveway , tripped over the flower pot and fell face down. Pt has a lac noted on her R eyebrow. C/o bilateral arm pain and knee pain.  No LOC. Not on blood thinner. C-collar applied on by EMS. VSS. A&O X4.

## 2023-04-25 NOTE — ED Provider Notes (Signed)
Emergency Department Provider Note   I have reviewed the triage vital signs and the nursing notes.   HISTORY  Chief Complaint Fall   HPI Morgan Potter is a 72 y.o. female with past medical history reviewed below presents to the emergency department with facial injury after fall.  She tripped on a flowerpot in her yard and fell to the ground striking her face.  No loss of consciousness.  She has some minimal bleeding to the right eyebrow.  No vision changes.  No near syncope symptoms prior to falling.  She is hurting mainly in her right arm, left shoulder, bilateral knees.  She has some swelling to the right arm and is concerned that this may be fractured. No numbness. No abdominal pain. No HA currently.    Past Medical History:  Diagnosis Date   Dizziness and giddiness    Dyspnea    Fatigue    Lightheadedness    Palpitations    SOB (shortness of breath)    Weakness     Review of Systems  Constitutional: No fever/chills Cardiovascular: Denies chest pain. Respiratory: Denies shortness of breath. Gastrointestinal: No abdominal pain.  No nausea, no vomiting.   Genitourinary: Negative for dysuria. Musculoskeletal: Negative for back pain. Positive bilateral arm/leg pain.  Skin: Negative for rash. Positive abrasion/laceration to the right eyebrow.  Neurological: Negative for headaches.  ____________________________________________   PHYSICAL EXAM:  VITAL SIGNS: ED Triage Vitals  Encounter Vitals Group     BP 04/25/23 1045 (!) 159/101     Pulse Rate 04/25/23 1045 94     Resp 04/25/23 1108 16     Temp 04/25/23 1108 97.7 F (36.5 C)     Temp Source 04/25/23 1108 Oral     SpO2 04/25/23 1045 98 %   Constitutional: Alert and oriented. Well appearing and in no acute distress. Eyes: Conjunctivae are normal. EOMI. No large hematoma or entrapment.  Head: No large hematoma. Abrasion vs small laceration to the right eyebrow.  Nose: No congestion/rhinnorhea. Mouth/Throat:  Mucous membranes are moist.  Oropharynx non-erythematous. Neck: No stridor.  C collar in place.  Cardiovascular: Normal rate, regular rhythm. Good peripheral circulation. Grossly normal heart sounds.   Respiratory: Normal respiratory effort.  No retractions. Lungs CTAB. Gastrointestinal: Soft and nontender. No distention.  Musculoskeletal: Newness with focal swelling to the right upper arm.  No obvious shoulder dislocation on the right or left.  No tenderness to palpation of the elbow, forearm, wrist on the right.  Compartments are soft.  Preserved range of motion of the bilateral hips.  Mild abrasion to the left anterior knee with focal tenderness but preserved range of motion in bilateral knees and ankles.  Neurologic:  Normal speech and language. No gross focal neurologic deficits are appreciated.  Skin:  Skin is warm, dry. No rash noted.  ____________________________________________   LABS (all labs ordered are listed, but only abnormal results are displayed)  Labs Reviewed  BASIC METABOLIC PANEL - Abnormal; Notable for the following components:      Result Value   Glucose, Bld 122 (*)    All other components within normal limits  CBC WITH DIFFERENTIAL/PLATELET - Abnormal; Notable for the following components:   WBC 12.5 (*)    Hemoglobin 11.9 (*)    Neutro Abs 9.9 (*)    All other components within normal limits  PROTIME-INR  TYPE AND SCREEN  ABO/RH   ____________________________________________  EKG  None  ____________________________________________  RADIOLOGY  CT Head Wo Contrast  Result Date: 04/25/2023 CLINICAL DATA:  Head trauma, moderate-severe; Neck trauma (Age >= 65y) EXAM: CT HEAD WITHOUT CONTRAST CT CERVICAL SPINE WITHOUT CONTRAST TECHNIQUE: Multidetector CT imaging of the head and cervical spine was performed following the standard protocol without intravenous contrast. Multiplanar CT image reconstructions of the cervical spine were also generated. RADIATION  DOSE REDUCTION: This exam was performed according to the departmental dose-optimization program which includes automated exposure control, adjustment of the mA and/or kV according to patient size and/or use of iterative reconstruction technique. COMPARISON:  None Available. FINDINGS: CT HEAD FINDINGS Brain: No hemorrhage. No hydrocephalus. No extra-axial fluid collection. No CT evidence of an acute cortical infarct. No mass effect. No mass lesion. Vascular: No hyperdense vessel or unexpected calcification. Skull: Normal. Negative for fracture or focal lesion. Sinuses/Orbits: No middle ear or mastoid effusion. Paranasal sinuses are clear. Right lens replacement. Orbits are otherwise unremarkable. Other: None. CT CERVICAL SPINE FINDINGS Alignment: Normal. Skull base and vertebrae: Chronic compression deformity of the superior endplate of T1. 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 no acute cervical spine fracture Soft tissues and spinal canal: No prevertebral fluid or swelling. No visible canal hematoma. Disc levels:  No CT evidence of high-grade spinal canal stenosis. Upper chest: Negative. Other: None IMPRESSION: 1. No CT evidence of intracranial injury. 2. No acute cervical spine fracture. 3. Chronic compression deformity of the superior endplate of T1. Electronically Signed   By: Lorenza Cambridge M.D.   On: 04/25/2023 12:57   CT Cervical Spine Wo Contrast  Result Date: 04/25/2023 CLINICAL DATA:  Head trauma, moderate-severe; Neck trauma (Age >= 65y) EXAM: CT HEAD WITHOUT CONTRAST CT CERVICAL SPINE WITHOUT CONTRAST TECHNIQUE: Multidetector CT imaging of the head and cervical spine was performed following the standard protocol without intravenous contrast. Multiplanar CT image reconstructions of the cervical spine were also generated. RADIATION DOSE REDUCTION: This exam was performed according to the departmental dose-optimization program which includes automated exposure control, adjustment of the mA and/or kV according to  patient size and/or use of iterative reconstruction technique. COMPARISON:  None Available. FINDINGS: CT HEAD FINDINGS Brain: No hemorrhage. No hydrocephalus. No extra-axial fluid collection. No CT evidence of an acute cortical infarct. No mass effect. No mass lesion. Vascular: No hyperdense vessel or unexpected calcification. Skull: Normal. Negative for fracture or focal lesion. Sinuses/Orbits: No middle ear or mastoid effusion. Paranasal sinuses are clear. Right lens replacement. Orbits are otherwise unremarkable. Other: None. CT CERVICAL SPINE FINDINGS Alignment: Normal. Skull base and vertebrae: Chronic compression deformity of the superior endplate of T1. 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 no acute cervical spine fracture Soft tissues and spinal canal: No prevertebral fluid or swelling. No visible canal hematoma. Disc levels:  No CT evidence of high-grade spinal canal stenosis. Upper chest: Negative. Other: None IMPRESSION: 1. No CT evidence of intracranial injury. 2. No acute cervical spine fracture. 3. Chronic compression deformity of the superior endplate of T1. Electronically Signed   By: Lorenza Cambridge M.D.   On: 04/25/2023 12:57   DG Pelvis 1-2 Views  Result Date: 04/25/2023 CLINICAL DATA:  Status post fall. Facial laceration with bilateral arm and knee pain. EXAM: LEFT KNEE - COMPLETE 4+ VIEW; PELVIS - 1-2 VIEW; RIGHT KNEE - COMPLETE 4+ VIEW; LEFT SHOULDER - 2+ VIEW; RIGHT SHOULDER - 2+ VIEW COMPARISON:  None relevant.  Concurrent chest radiographs. FINDINGS: Right shoulder: Right humeral radiographs are dictated separately. These better demonstrate an acute angulated and mildly comminuted fracture of the distal 3rd of the right humeral diaphysis. The right humeral  head is abnormal with fragmentation, irregular sclerosis and possible anterior inferior dislocation. These findings are not favored to be acute and may reflect the sequela of an old or subacute injury. Correlate clinically. The right scapula appears  intact. Left shoulder: There is a comminuted and mildly displaced acute fracture of the left humeral neck. The greater tuberosity is mildly displaced. Mild underlying glenohumeral and acromioclavicular degenerative changes. No dislocation. One-view pelvis: 1208 hours. The bones appear mildly demineralized. No evidence of acute fracture or dislocation. Mild degenerative changes of both hips and sacroiliac joints. Surgical clips overlie the right sacroiliac joint. Right knee: The mineralization and alignment are normal. There is no evidence of acute fracture or dislocation. Mild tricompartmental degenerative changes. 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 No significant knee joint effusion or other focal soft tissue abnormality. Left knee: The mineralization and alignment are normal. There is suspicion of a nondisplaced transverse fracture of the patella, best seen on the externally rotated oblique view. This finding is not well seen in the other projections. No other evidence of acute fracture or dislocation. There are tricompartmental degenerative changes with meniscal chondrocalcinosis and a small knee joint effusion. IMPRESSION: 1. Acute angulated and mildly comminuted fracture of the distal 3rd of the right humeral diaphysis. See separate report for right humerus. 2. Suspected chronic fracture of the right humeral head with fragmentation and possible anterior inferior dislocation. 3. Comminuted and mildly displaced acute fracture of the left humeral neck. 4. No evidence of acute pelvic fracture. 5. Suspected nondisplaced transverse fracture of the left patella. Correlate clinically. No other evidence of acute fracture or dislocation in either knee. 6. 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 Electronically Signed   By: Carey Bullocks M.D.   On: 04/25/2023 12:56   DG Shoulder Right  Result Date: 04/25/2023 CLINICAL DATA:  Status post fall. Facial laceration with bilateral arm and  knee pain. EXAM: LEFT KNEE - COMPLETE 4+ VIEW; PELVIS - 1-2 VIEW; RIGHT KNEE - COMPLETE 4+ VIEW; LEFT SHOULDER - 2+ VIEW; RIGHT SHOULDER - 2+ VIEW COMPARISON:  None relevant.  Concurrent chest radiographs. FINDINGS: Right shoulder: Right humeral radiographs are dictated separately. These better demonstrate an acute angulated and mildly comminuted fracture of the distal 3rd of the right humeral diaphysis. The right humeral head is abnormal with fragmentation, irregular sclerosis and possible anterior inferior dislocation. These findings are not favored to be acute and may reflect the sequela of an old or subacute injury. Correlate clinically. The right scapula appears intact. Left shoulder: There is a comminuted and mildly displaced acute fracture of the left humeral neck. The greater tuberosity is mildly displaced. Mild underlying glenohumeral and acromioclavicular degenerative changes. No dislocation. One-view pelvis: 1208 hours. The bones appear mildly demineralized. No evidence of acute fracture or dislocation. Mild degenerative changes of both hips and sacroiliac joints. Surgical clips overlie the right sacroiliac joint. Right knee: The mineralization and alignment are normal. There is no evidence of acute fracture or dislocation. Mild tricompartmental degenerative changes. 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 No significant knee joint effusion or other focal soft tissue abnormality. Left knee: The mineralization and alignment are normal. There is suspicion of a nondisplaced transverse fracture of the patella, best seen on the externally rotated oblique view. This finding is not well seen in the other projections. No other evidence of acute fracture or dislocation. There are tricompartmental degenerative changes with meniscal chondrocalcinosis and a small knee joint effusion. IMPRESSION: 1. Acute angulated and mildly comminuted fracture of the distal 3rd of the right  humeral diaphysis. See separate report for  right humerus. 2. Suspected chronic fracture of the right humeral head with fragmentation and possible anterior inferior dislocation. 3. Comminuted and mildly displaced acute fracture of the left humeral neck. 4. No evidence of acute pelvic fracture. 5. Suspected nondisplaced transverse fracture of the left patella. Correlate clinically. No other evidence of acute fracture or dislocation in either knee. 6. 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 Electronically Signed   By: Carey Bullocks M.D.   On: 04/25/2023 12:56   DG Shoulder Left  Result Date: 04/25/2023 CLINICAL DATA:  Status post fall. Facial laceration with bilateral arm and knee pain. EXAM: LEFT KNEE - COMPLETE 4+ VIEW; PELVIS - 1-2 VIEW; RIGHT KNEE - COMPLETE 4+ VIEW; LEFT SHOULDER - 2+ VIEW; RIGHT SHOULDER - 2+ VIEW COMPARISON:  None relevant.  Concurrent chest radiographs. FINDINGS: Right shoulder: Right humeral radiographs are dictated separately. These better demonstrate an acute angulated and mildly comminuted fracture of the distal 3rd of the right humeral diaphysis. The right humeral head is abnormal with fragmentation, irregular sclerosis and possible anterior inferior dislocation. These findings are not favored to be acute and may reflect the sequela of an old or subacute injury. Correlate clinically. The right scapula appears intact. Left shoulder: There is a comminuted and mildly displaced acute fracture of the left humeral neck. The greater tuberosity is mildly displaced. Mild underlying glenohumeral and acromioclavicular degenerative changes. No dislocation. One-view pelvis: 1208 hours. The bones appear mildly demineralized. No evidence of acute fracture or dislocation. Mild degenerative changes of both hips and sacroiliac joints. Surgical clips overlie the right sacroiliac joint. Right knee: The mineralization and alignment are normal. There is no evidence of acute fracture or dislocation. Mild  tricompartmental degenerative changes. 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 No significant knee joint effusion or other focal soft tissue abnormality. Left knee: The mineralization and alignment are normal. There is suspicion of a nondisplaced transverse fracture of the patella, best seen on the externally rotated oblique view. This finding is not well seen in the other projections. No other evidence of acute fracture or dislocation. There are tricompartmental degenerative changes with meniscal chondrocalcinosis and a small knee joint effusion. IMPRESSION: 1. Acute angulated and mildly comminuted fracture of the distal 3rd of the right humeral diaphysis. See separate report for right humerus. 2. Suspected chronic fracture of the right humeral head with fragmentation and possible anterior inferior dislocation. 3. Comminuted and mildly displaced acute fracture of the left humeral neck. 4. No evidence of acute pelvic fracture. 5. Suspected nondisplaced transverse fracture of the left patella. Correlate clinically. No other evidence of acute fracture or dislocation in either knee. 6. 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 Electronically Signed   By: Carey Bullocks M.D.   On: 04/25/2023 12:56   DG Knee Complete 4 Views Right  Result Date: 04/25/2023 CLINICAL DATA:  Status post fall. Facial laceration with bilateral arm and knee pain. EXAM: LEFT KNEE - COMPLETE 4+ VIEW; PELVIS - 1-2 VIEW; RIGHT KNEE - COMPLETE 4+ VIEW; LEFT SHOULDER - 2+ VIEW; RIGHT SHOULDER - 2+ VIEW COMPARISON:  None relevant.  Concurrent chest radiographs. FINDINGS: Right shoulder: Right humeral radiographs are dictated separately. These better demonstrate an acute angulated and mildly comminuted fracture of the distal 3rd of the right humeral diaphysis. The right humeral head is abnormal with fragmentation, irregular sclerosis and possible anterior inferior dislocation. These findings are not favored to be acute and  may reflect the sequela of an old or subacute injury.  Correlate clinically. The right scapula appears intact. Left shoulder: There is a comminuted and mildly displaced acute fracture of the left humeral neck. The greater tuberosity is mildly displaced. Mild underlying glenohumeral and acromioclavicular degenerative changes. No dislocation. One-view pelvis: 1208 hours. The bones appear mildly demineralized. No evidence of acute fracture or dislocation. Mild degenerative changes of both hips and sacroiliac joints. Surgical clips overlie the right sacroiliac joint. Right knee: The mineralization and alignment are normal. There is no evidence of acute fracture or dislocation. Mild tricompartmental degenerative changes. 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 No significant knee joint effusion or other focal soft tissue abnormality. Left knee: The mineralization and alignment are normal. There is suspicion of a nondisplaced transverse fracture of the patella, best seen on the externally rotated oblique view. This finding is not well seen in the other projections. No other evidence of acute fracture or dislocation. There are tricompartmental degenerative changes with meniscal chondrocalcinosis and a small knee joint effusion. IMPRESSION: 1. Acute angulated and mildly comminuted fracture of the distal 3rd of the right humeral diaphysis. See separate report for right humerus. 2. Suspected chronic fracture of the right humeral head with fragmentation and possible anterior inferior dislocation. 3. Comminuted and mildly displaced acute fracture of the left humeral neck. 4. No evidence of acute pelvic fracture. 5. Suspected nondisplaced transverse fracture of the left patella. Correlate clinically. No other evidence of acute fracture or dislocation in either knee. 6. 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 Electronically Signed   By: Carey Bullocks M.D.   On: 04/25/2023 12:56   DG Knee Complete 4  Views Left  Result Date: 04/25/2023 CLINICAL DATA:  Status post fall. Facial laceration with bilateral arm and knee pain. EXAM: LEFT KNEE - COMPLETE 4+ VIEW; PELVIS - 1-2 VIEW; RIGHT KNEE - COMPLETE 4+ VIEW; LEFT SHOULDER - 2+ VIEW; RIGHT SHOULDER - 2+ VIEW COMPARISON:  None relevant.  Concurrent chest radiographs. FINDINGS: Right shoulder: Right humeral radiographs are dictated separately. These better demonstrate an acute angulated and mildly comminuted fracture of the distal 3rd of the right humeral diaphysis. The right humeral head is abnormal with fragmentation, irregular sclerosis and possible anterior inferior dislocation. These findings are not favored to be acute and may reflect the sequela of an old or subacute injury. Correlate clinically. The right scapula appears intact. Left shoulder: There is a comminuted and mildly displaced acute fracture of the left humeral neck. The greater tuberosity is mildly displaced. Mild underlying glenohumeral and acromioclavicular degenerative changes. No dislocation. One-view pelvis: 1208 hours. The bones appear mildly demineralized. No evidence of acute fracture or dislocation. Mild degenerative changes of both hips and sacroiliac joints. Surgical clips overlie the right sacroiliac joint. Right knee: The mineralization and alignment are normal. There is no evidence of acute fracture or dislocation. Mild tricompartmental degenerative changes. 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 No significant knee joint effusion or other focal soft tissue abnormality. Left knee: The mineralization and alignment are normal. There is suspicion of a nondisplaced transverse fracture of the patella, best seen on the externally rotated oblique view. This finding is not well seen in the other projections. No other evidence of acute fracture or dislocation. There are tricompartmental degenerative changes with meniscal chondrocalcinosis and a small knee joint effusion. IMPRESSION: 1. Acute  angulated and mildly comminuted fracture of the distal 3rd of the right humeral diaphysis. See separate report for right humerus. 2. Suspected chronic fracture of the right humeral head with fragmentation and possible anterior inferior dislocation. 3. Comminuted and mildly  displaced acute fracture of the left humeral neck. 4. No evidence of acute pelvic fracture. 5. Suspected nondisplaced transverse fracture of the left patella. Correlate clinically. No other evidence of acute fracture or dislocation in either knee. 6. 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 Electronically Signed   By: Carey Bullocks M.D.   On: 04/25/2023 12:56   DG Humerus Right  Result Date: 04/25/2023 CLINICAL DATA:  Status post fall. EXAM: RIGHT HUMERUS - 2+ VIEW COMPARISON:  None Available. FINDINGS: There is an acute fracture deformity involving the distal diaphysis of the right humerus. There is proximal and medial displacement with medial angulation of the distal fracture fragments. Severe degenerative changes identified within the glenohumeral joint. IMPRESSION: Acute fracture deformity involves the distal diaphysis of the right humerus. Electronically Signed   By: Signa Kell M.D.   On: 04/25/2023 12:53   DG Chest Portable 1 View  Result Date: 04/25/2023 CLINICAL DATA:  Larey Seat at home. EXAM: PORTABLE CHEST 1 VIEW COMPARISON:  None Available. FINDINGS: Cardiac silhouette borderline enlarged. No mediastinal or hilar masses. Clear lungs. No gross pneumothorax or pleural effusion on this supine exam. Right glenohumeral joint space narrowing. Questionable fracture or chronic findings of the right humeral head. This will be further evaluated with dedicated radiographs. IMPRESSION: 1. No acute cardiopulmonary disease. Electronically Signed   By: Amie Portland M.D.   On: 04/25/2023 12:50    ____________________________________________   PROCEDURES  Procedure(s) performed:   .Laceration  Repair  Date/Time: 04/25/2023 3:32 PM  Performed by: Maia Plan, MD Authorized by: Maia Plan, MD   Consent:    Consent obtained:  Verbal   Consent given by:  Patient   Risks, benefits, and alternatives were discussed: yes     Risks discussed:  Infection, need for additional repair, nerve damage, pain, poor cosmetic result, poor wound healing, retained foreign body and vascular damage   Alternatives discussed:  No treatment Universal protocol:    Patient identity confirmed:  Verbally with patient Anesthesia:    Anesthesia method:  Local infiltration   Local anesthetic:  Lidocaine 1% w/o epi Laceration details:    Location:  Face   Face location:  R eyebrow   Length (cm):  4 Pre-procedure details:    Preparation:  Patient was prepped and draped in usual sterile fashion and imaging obtained to evaluate for foreign bodies Exploration:    Limited defect created (wound extended): no     Hemostasis achieved with:  Direct pressure   Imaging obtained comment:  CT head   Imaging outcome: foreign body not noted     Wound exploration: wound explored through full range of motion and entire depth of wound visualized     Wound extent: fascia not violated, no foreign body, no signs of injury, no nerve damage, no underlying fracture and no vascular damage     Contaminated: no   Treatment:    Area cleansed with:  Saline   Amount of cleaning:  Standard   Irrigation solution:  Sterile saline   Irrigation volume:  300   Irrigation method:  Pressure wash   Debridement:  None   Undermining:  None Skin repair:    Repair method:  Sutures   Suture size:  5-0   Suture material:  Prolene   Suture technique:  Simple interrupted   Number of sutures:  4 Approximation:    Approximation:  Close Repair type:    Repair type:  Simple Post-procedure details:    Dressing:  Open (  no dressing)   Procedure completion:  Tolerated well, no immediate  complications    ____________________________________________   INITIAL IMPRESSION / ASSESSMENT AND PLAN / ED COURSE  Pertinent labs & imaging results that were available during my care of the patient were reviewed by me and considered in my medical decision making (see chart for details).   This patient is Presenting for Evaluation of fall/head injury, which does require a range of treatment options, and is a complaint that involves a high risk of morbidity and mortality.  The Differential Diagnoses includes subdural hematoma, epidural hematoma, acute concussion, traumatic subarachnoid hemorrhage, cerebral contusions, etc.   Critical Interventions-    Medications  fentaNYL (SUBLIMAZE) injection 25 mcg (has no administration in time range)  enoxaparin (LOVENOX) injection 40 mg (has no administration in time range)  senna-docusate (Senokot-S) tablet 1 tablet (has no administration in time range)  pravastatin (PRAVACHOL) tablet 20 mg (has no administration in time range)  hydrALAZINE (APRESOLINE) injection 10 mg (has no administration in time range)  buPROPion (WELLBUTRIN XL) 24 hr tablet 300 mg (has no administration in time range)  escitalopram (LEXAPRO) tablet 20 mg (has no administration in time range)  pantoprazole (PROTONIX) EC tablet 40 mg (has no administration in time range)  ferrous sulfate tablet 325 mg (has no administration in time range)  LORazepam (ATIVAN) tablet 1 mg (has no administration in time range)  losartan (COZAAR) tablet 50 mg (has no administration in time range)  hydrochlorothiazide (HYDRODIURIL) tablet 12.5 mg (has no administration in time range)  fentaNYL (SUBLIMAZE) injection 25 mcg (25 mcg Intravenous Given 04/25/23 1316)  lidocaine (PF) (XYLOCAINE) 1 % injection 5 mL (5 mLs Other Given 04/25/23 1500)  acetaminophen (TYLENOL) tablet 1,000 mg (1,000 mg Oral Given 04/25/23 1421)    Reassessment after intervention:  pain improved.    I did obtain  Additional Historical Information from daughter at bedside.  Radiologic Tests Ordered, included CT head, c spine, various plain films. I independently interpreted the images and agree with radiology interpretation.   Cardiac Monitor Tracing which shows NSR.    Social Determinants of Health Risk patient is a non-smoker.   Consult complete with Dr. Roda Shutters with Ortho. Plan for medicine admit and he will consult regarding operative repair plan.   TRH, Dr. Katrinka Blazing. Plan for admit.   Medical Decision Making: Summary:  Presents emergency department for evaluation after fall.  Patient gives a good history of mechanical fall.  No neurodeficits.  Patient may have a humerus fracture but doubt dislocation clinically.  Plan for imaging and reassess.   Reevaluation with update and discussion with patient and family at bedside. Pain is well controlled. Laceration cleaned and repaired. Suture removal in 7 days. Plan for admit. They are in agreement.   Patient's presentation is most consistent with acute presentation with potential threat to life or bodily function.   Disposition: admit  ____________________________________________  FINAL CLINICAL IMPRESSION(S) / ED DIAGNOSES  Final diagnoses:  Fall in home, initial encounter  Closed displaced comminuted fracture of shaft of right humerus, initial encounter    Note:  This document was prepared using Dragon voice recognition software and may include unintentional dictation errors.  Alona Bene, MD, [facility]    Becka Lagasse, Arlyss Repress, MD 04/25/23 1535

## 2023-04-25 NOTE — ED Notes (Signed)
ED TO INPATIENT HANDOFF REPORT  ED Nurse Name and Phone #: [phone] [name] y   S Name/Age/Gender Morgan Potter 72 y.o. female Room/Bed: 029C/029C  Code Status   Code Status: Full Code  Home/SNF/Other Home Patient oriented to: self, place, time, and situation Is this baseline? Yes   Triage Complete: Triage complete  Chief Complaint Fall [W19.XXXA]  Triage Note Pt BIB [facility] from home d/t fall. Pt was on the way to her driveway , tripped over the flower pot and fell face down. Pt has a lac noted on her R eyebrow. C/o bilateral arm pain and knee pain.  No LOC. Not on blood thinner. C-collar applied on by EMS. VSS. A&O X4.    Allergies Allergies  Allergen Reactions   Codeine    Morphine And Codeine    Sulfa Antibiotics     Level of Care/Admitting Diagnosis ED Disposition     ED Disposition  Admit   Condition  --   Comment  Hospital Area: [facility] [[id number]]  Level of Care: Telemetry Medical [104]  May admit patient to [facility] or [facility] if equivalent level of care is available:: No  Covid Evaluation: Asymptomatic - no recent exposure (last 10 days) testing not required  Diagnosis: Fall [290176]  Admitting Physician: Clydie Braun [[id number]]  Attending Physician: Clydie Braun [[id number]]  Certification:: I certify this patient is being admitted for an inpatient-only procedure  Expected Medical Readiness: 04/28/2023          B Medical/Surgery History Past Medical History:  Diagnosis Date   Dizziness and giddiness    Dyspnea    Fatigue    Lightheadedness    Palpitations    SOB (shortness of breath)    Weakness    Past Surgical History:  Procedure Laterality Date   APPENDECTOMY  1976   CESAREAN SECTION  1981   CESAREAN SECTION  1969   cholecystectomy  1976     A IV Location/Drains/Wounds Patient Lines/Drains/Airways Status     Active Line/Drains/Airways     Name Placement date Placement time Site Days   Peripheral  IV 04/25/23 20 G Left Antecubital 04/25/23  1316  Antecubital  less than 1            Intake/Output Last 24 hours No intake or output data in the 24 hours ending 04/25/23 1510  Labs/Imaging Results for orders placed or performed during the hospital encounter of 04/25/23 (from the past 48 hour(s))  Basic metabolic panel     Status: Abnormal   Collection Time: 04/25/23 12:45 PM  Result Value Ref Range   Sodium 138 135 - 145 mmol/L   Potassium 4.2 3.5 - 5.1 mmol/L   Chloride 102 98 - 111 mmol/L   CO2 24 22 - 32 mmol/L   Glucose, Bld 122 (H) 70 - 99 mg/dL    Comment: Glucose reference range applies only to samples taken after fasting for at least 8 hours.   BUN 20 8 - 23 mg/dL   Creatinine, Ser 6.96 0.44 - 1.00 mg/dL   Calcium 9.6 8.9 - 29.5 mg/dL   GFR, Estimated >28 >41 mL/min    Comment: (NOTE) Calculated using the CKD-EPI Creatinine Equation (2021)    Anion gap 12 5 - 15    Comment: Performed at [facility], [address], [place], [place] [address]  CBC with Differential     Status: Abnormal   Collection Time: 04/25/23 12:45 PM  Result Value Ref  Range   WBC 12.5 (H) 4.0 - 10.5 K/uL   RBC 3.97 3.87 - 5.11 MIL/uL   Hemoglobin 11.9 (L) 12.0 - 15.0 g/dL   HCT 66.4 40.3 - 47.4 %   MCV 90.9 80.0 - 100.0 fL   MCH 30.0 26.0 - 34.0 pg   MCHC 33.0 30.0 - 36.0 g/dL   RDW 25.9 56.3 - 87.5 %   Platelets 314 150 - 400 K/uL   nRBC 0.0 0.0 - 0.2 %   Neutrophils Relative % 78 %   Neutro Abs 9.9 (H) 1.7 - 7.7 K/uL   Lymphocytes Relative 13 %   Lymphs Abs 1.6 0.7 - 4.0 K/uL   Monocytes Relative 7 %   Monocytes Absolute 0.8 0.1 - 1.0 K/uL   Eosinophils Relative 1 %   Eosinophils Absolute 0.1 0.0 - 0.5 K/uL   Basophils Relative 1 %   Basophils Absolute 0.1 0.0 - 0.1 K/uL   Immature Granulocytes 0 %   Abs Immature Granulocytes 0.05 0.00 - 0.07 K/uL    Comment: Performed at [facility], [address] [place], [place] [address]  Protime-INR     Status: None    Collection Time: 04/25/23 12:45 PM  Result Value Ref Range   Prothrombin Time 13.8 11.4 - 15.2 seconds   INR 1.0 0.8 - 1.2    Comment: (NOTE) INR goal varies based on device and disease states. Performed at [facility], [address], [place], [place] [address]    CT Head Wo Contrast  Result Date: 04/25/2023 CLINICAL DATA:  Head trauma, moderate-severe; Neck trauma (Age >= 65y) EXAM: CT HEAD WITHOUT CONTRAST CT CERVICAL SPINE WITHOUT CONTRAST TECHNIQUE: Multidetector CT imaging of the head and cervical spine was performed following the standard protocol without intravenous contrast. Multiplanar CT image reconstructions of the cervical spine were also generated. RADIATION DOSE REDUCTION: This exam was performed according to the departmental dose-optimization program which includes automated exposure control, adjustment of the mA and/or kV according to patient size and/or use of iterative reconstruction technique. COMPARISON:  None Available. FINDINGS: CT HEAD FINDINGS Brain: No hemorrhage. No hydrocephalus. No extra-axial fluid collection. No CT evidence of an acute cortical infarct. No mass effect. No mass lesion. Vascular: No hyperdense vessel or unexpected calcification. Skull: Normal. Negative for fracture or focal lesion. Sinuses/Orbits: No middle ear or mastoid effusion. Paranasal sinuses are clear. Right lens replacement. Orbits are otherwise unremarkable. Other: None. CT CERVICAL SPINE FINDINGS Alignment: Normal. Skull base and vertebrae: Chronic compression deformity of the superior endplate of T1. 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 no acute cervical spine fracture Soft tissues and spinal canal: No prevertebral fluid or swelling. No visible canal hematoma. Disc levels:  No CT evidence of high-grade spinal canal stenosis. Upper chest: Negative. Other: None IMPRESSION: 1. No CT evidence of intracranial injury. 2. No acute cervical spine fracture. 3. Chronic compression deformity of the superior  endplate of T1. Electronically Signed   By: Lorenza Cambridge M.D.   On: 04/25/2023 12:57   CT Cervical Spine Wo Contrast  Result Date: 04/25/2023 CLINICAL DATA:  Head trauma, moderate-severe; Neck trauma (Age >= 65y) EXAM: CT HEAD WITHOUT CONTRAST CT CERVICAL SPINE WITHOUT CONTRAST TECHNIQUE: Multidetector CT imaging of the head and cervical spine was performed following the standard protocol without intravenous contrast. Multiplanar CT image reconstructions of the cervical spine were also generated. RADIATION DOSE REDUCTION: This exam was performed according to the departmental dose-optimization program which includes automated exposure control, adjustment of the mA  and/or kV according to patient size and/or use of iterative reconstruction technique. COMPARISON:  None Available. FINDINGS: CT HEAD FINDINGS Brain: No hemorrhage. No hydrocephalus. No extra-axial fluid collection. No CT evidence of an acute cortical infarct. No mass effect. No mass lesion. Vascular: No hyperdense vessel or unexpected calcification. Skull: Normal. Negative for fracture or focal lesion. Sinuses/Orbits: No middle ear or mastoid effusion. Paranasal sinuses are clear. Right lens replacement. Orbits are otherwise unremarkable. Other: None. CT CERVICAL SPINE FINDINGS Alignment: Normal. Skull base and vertebrae: Chronic compression deformity of the superior endplate of T1. 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 no acute cervical spine fracture Soft tissues and spinal canal: No prevertebral fluid or swelling. No visible canal hematoma. Disc levels:  No CT evidence of high-grade spinal canal stenosis. Upper chest: Negative. Other: None IMPRESSION: 1. No CT evidence of intracranial injury. 2. No acute cervical spine fracture. 3. Chronic compression deformity of the superior endplate of T1. Electronically Signed   By: Lorenza Cambridge M.D.   On: 04/25/2023 12:57   DG Pelvis 1-2 Views  Result Date: 04/25/2023 CLINICAL DATA:  Status post fall. Facial  laceration with bilateral arm and knee pain. EXAM: LEFT KNEE - COMPLETE 4+ VIEW; PELVIS - 1-2 VIEW; RIGHT KNEE - COMPLETE 4+ VIEW; LEFT SHOULDER - 2+ VIEW; RIGHT SHOULDER - 2+ VIEW COMPARISON:  None relevant.  Concurrent chest radiographs. FINDINGS: Right shoulder: Right humeral radiographs are dictated separately. These better demonstrate an acute angulated and mildly comminuted fracture of the distal 3rd of the right humeral diaphysis. The right humeral head is abnormal with fragmentation, irregular sclerosis and possible anterior inferior dislocation. These findings are not favored to be acute and may reflect the sequela of an old or subacute injury. Correlate clinically. The right scapula appears intact. Left shoulder: There is a comminuted and mildly displaced acute fracture of the left humeral neck. The greater tuberosity is mildly displaced. Mild underlying glenohumeral and acromioclavicular degenerative changes. No dislocation. One-view pelvis: 1208 hours. The bones appear mildly demineralized. No evidence of acute fracture or dislocation. Mild degenerative changes of both hips and sacroiliac joints. Surgical clips overlie the right sacroiliac joint. Right knee: The mineralization and alignment are normal. There is no evidence of acute fracture or dislocation. Mild tricompartmental degenerative changes. 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 No significant knee joint effusion or other focal soft tissue abnormality. Left knee: The mineralization and alignment are normal. There is suspicion of a nondisplaced transverse fracture of the patella, best seen on the externally rotated oblique view. This finding is not well seen in the other projections. No other evidence of acute fracture or dislocation. There are tricompartmental degenerative changes with meniscal chondrocalcinosis and a small knee joint effusion. IMPRESSION: 1. Acute angulated and mildly comminuted fracture of the distal 3rd of the right humeral  diaphysis. See separate report for right humerus. 2. Suspected chronic fracture of the right humeral head with fragmentation and possible anterior inferior dislocation. 3. Comminuted and mildly displaced acute fracture of the left humeral neck. 4. No evidence of acute pelvic fracture. 5. Suspected nondisplaced transverse fracture of the left patella. Correlate clinically. No other evidence of acute fracture or dislocation in either knee. 6. 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 Electronically Signed   By: Carey Bullocks M.D.   On: 04/25/2023 12:56   DG Shoulder Right  Result Date: 04/25/2023 CLINICAL DATA:  Status post fall. Facial laceration with bilateral arm and knee pain. EXAM: LEFT KNEE - COMPLETE 4+ VIEW; PELVIS - 1-2 VIEW; RIGHT KNEE -  COMPLETE 4+ VIEW; LEFT SHOULDER - 2+ VIEW; RIGHT SHOULDER - 2+ VIEW COMPARISON:  None relevant.  Concurrent chest radiographs. FINDINGS: Right shoulder: Right humeral radiographs are dictated separately. These better demonstrate an acute angulated and mildly comminuted fracture of the distal 3rd of the right humeral diaphysis. The right humeral head is abnormal with fragmentation, irregular sclerosis and possible anterior inferior dislocation. These findings are not favored to be acute and may reflect the sequela of an old or subacute injury. Correlate clinically. The right scapula appears intact. Left shoulder: There is a comminuted and mildly displaced acute fracture of the left humeral neck. The greater tuberosity is mildly displaced. Mild underlying glenohumeral and acromioclavicular degenerative changes. No dislocation. One-view pelvis: 1208 hours. The bones appear mildly demineralized. No evidence of acute fracture or dislocation. Mild degenerative changes of both hips and sacroiliac joints. Surgical clips overlie the right sacroiliac joint. Right knee: The mineralization and alignment are normal. There is no evidence of acute fracture  or dislocation. Mild tricompartmental degenerative changes. 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 No significant knee joint effusion or other focal soft tissue abnormality. Left knee: The mineralization and alignment are normal. There is suspicion of a nondisplaced transverse fracture of the patella, best seen on the externally rotated oblique view. This finding is not well seen in the other projections. No other evidence of acute fracture or dislocation. There are tricompartmental degenerative changes with meniscal chondrocalcinosis and a small knee joint effusion. IMPRESSION: 1. Acute angulated and mildly comminuted fracture of the distal 3rd of the right humeral diaphysis. See separate report for right humerus. 2. Suspected chronic fracture of the right humeral head with fragmentation and possible anterior inferior dislocation. 3. Comminuted and mildly displaced acute fracture of the left humeral neck. 4. No evidence of acute pelvic fracture. 5. Suspected nondisplaced transverse fracture of the left patella. Correlate clinically. No other evidence of acute fracture or dislocation in either knee. 6. 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 Electronically Signed   By: Carey Bullocks M.D.   On: 04/25/2023 12:56   DG Shoulder Left  Result Date: 04/25/2023 CLINICAL DATA:  Status post fall. Facial laceration with bilateral arm and knee pain. EXAM: LEFT KNEE - COMPLETE 4+ VIEW; PELVIS - 1-2 VIEW; RIGHT KNEE - COMPLETE 4+ VIEW; LEFT SHOULDER - 2+ VIEW; RIGHT SHOULDER - 2+ VIEW COMPARISON:  None relevant.  Concurrent chest radiographs. FINDINGS: Right shoulder: Right humeral radiographs are dictated separately. These better demonstrate an acute angulated and mildly comminuted fracture of the distal 3rd of the right humeral diaphysis. The right humeral head is abnormal with fragmentation, irregular sclerosis and possible anterior inferior dislocation. These findings are not favored to be  acute and may reflect the sequela of an old or subacute injury. Correlate clinically. The right scapula appears intact. Left shoulder: There is a comminuted and mildly displaced acute fracture of the left humeral neck. The greater tuberosity is mildly displaced. Mild underlying glenohumeral and acromioclavicular degenerative changes. No dislocation. One-view pelvis: 1208 hours. The bones appear mildly demineralized. No evidence of acute fracture or dislocation. Mild degenerative changes of both hips and sacroiliac joints. Surgical clips overlie the right sacroiliac joint. Right knee: The mineralization and alignment are normal. There is no evidence of acute fracture or dislocation. Mild tricompartmental degenerative changes. 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 No significant knee joint effusion or other focal soft tissue abnormality. Left knee: The mineralization and alignment are normal. There is suspicion of a nondisplaced transverse fracture of the patella, best seen on  the externally rotated oblique view. This finding is not well seen in the other projections. No other evidence of acute fracture or dislocation. There are tricompartmental degenerative changes with meniscal chondrocalcinosis and a small knee joint effusion. IMPRESSION: 1. Acute angulated and mildly comminuted fracture of the distal 3rd of the right humeral diaphysis. See separate report for right humerus. 2. Suspected chronic fracture of the right humeral head with fragmentation and possible anterior inferior dislocation. 3. Comminuted and mildly displaced acute fracture of the left humeral neck. 4. No evidence of acute pelvic fracture. 5. Suspected nondisplaced transverse fracture of the left patella. Correlate clinically. No other evidence of acute fracture or dislocation in either knee. 6. 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 Electronically Signed   By: Carey Bullocks M.D.   On: 04/25/2023 12:56   DG Knee  Complete 4 Views Right  Result Date: 04/25/2023 CLINICAL DATA:  Status post fall. Facial laceration with bilateral arm and knee pain. EXAM: LEFT KNEE - COMPLETE 4+ VIEW; PELVIS - 1-2 VIEW; RIGHT KNEE - COMPLETE 4+ VIEW; LEFT SHOULDER - 2+ VIEW; RIGHT SHOULDER - 2+ VIEW COMPARISON:  None relevant.  Concurrent chest radiographs. FINDINGS: Right shoulder: Right humeral radiographs are dictated separately. These better demonstrate an acute angulated and mildly comminuted fracture of the distal 3rd of the right humeral diaphysis. The right humeral head is abnormal with fragmentation, irregular sclerosis and possible anterior inferior dislocation. These findings are not favored to be acute and may reflect the sequela of an old or subacute injury. Correlate clinically. The right scapula appears intact. Left shoulder: There is a comminuted and mildly displaced acute fracture of the left humeral neck. The greater tuberosity is mildly displaced. Mild underlying glenohumeral and acromioclavicular degenerative changes. No dislocation. One-view pelvis: 1208 hours. The bones appear mildly demineralized. No evidence of acute fracture or dislocation. Mild degenerative changes of both hips and sacroiliac joints. Surgical clips overlie the right sacroiliac joint. Right knee: The mineralization and alignment are normal. There is no evidence of acute fracture or dislocation. Mild tricompartmental degenerative changes. 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 No significant knee joint effusion or other focal soft tissue abnormality. Left knee: The mineralization and alignment are normal. There is suspicion of a nondisplaced transverse fracture of the patella, best seen on the externally rotated oblique view. This finding is not well seen in the other projections. No other evidence of acute fracture or dislocation. There are tricompartmental degenerative changes with meniscal chondrocalcinosis and a small knee joint effusion. IMPRESSION:  1. Acute angulated and mildly comminuted fracture of the distal 3rd of the right humeral diaphysis. See separate report for right humerus. 2. Suspected chronic fracture of the right humeral head with fragmentation and possible anterior inferior dislocation. 3. Comminuted and mildly displaced acute fracture of the left humeral neck. 4. No evidence of acute pelvic fracture. 5. Suspected nondisplaced transverse fracture of the left patella. Correlate clinically. No other evidence of acute fracture or dislocation in either knee. 6. 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 Electronically Signed   By: Carey Bullocks M.D.   On: 04/25/2023 12:56   DG Knee Complete 4 Views Left  Result Date: 04/25/2023 CLINICAL DATA:  Status post fall. Facial laceration with bilateral arm and knee pain. EXAM: LEFT KNEE - COMPLETE 4+ VIEW; PELVIS - 1-2 VIEW; RIGHT KNEE - COMPLETE 4+ VIEW; LEFT SHOULDER - 2+ VIEW; RIGHT SHOULDER - 2+ VIEW COMPARISON:  None relevant.  Concurrent chest radiographs. FINDINGS: Right shoulder: Right humeral radiographs are dictated  separately. These better demonstrate an acute angulated and mildly comminuted fracture of the distal 3rd of the right humeral diaphysis. The right humeral head is abnormal with fragmentation, irregular sclerosis and possible anterior inferior dislocation. These findings are not favored to be acute and may reflect the sequela of an old or subacute injury. Correlate clinically. The right scapula appears intact. Left shoulder: There is a comminuted and mildly displaced acute fracture of the left humeral neck. The greater tuberosity is mildly displaced. Mild underlying glenohumeral and acromioclavicular degenerative changes. No dislocation. One-view pelvis: 1208 hours. The bones appear mildly demineralized. No evidence of acute fracture or dislocation. Mild degenerative changes of both hips and sacroiliac joints. Surgical clips overlie the right sacroiliac  joint. Right knee: The mineralization and alignment are normal. There is no evidence of acute fracture or dislocation. Mild tricompartmental degenerative changes. 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 No significant knee joint effusion or other focal soft tissue abnormality. Left knee: The mineralization and alignment are normal. There is suspicion of a nondisplaced transverse fracture of the patella, best seen on the externally rotated oblique view. This finding is not well seen in the other projections. No other evidence of acute fracture or dislocation. There are tricompartmental degenerative changes with meniscal chondrocalcinosis and a small knee joint effusion. IMPRESSION: 1. Acute angulated and mildly comminuted fracture of the distal 3rd of the right humeral diaphysis. See separate report for right humerus. 2. Suspected chronic fracture of the right humeral head with fragmentation and possible anterior inferior dislocation. 3. Comminuted and mildly displaced acute fracture of the left humeral neck. 4. No evidence of acute pelvic fracture. 5. Suspected nondisplaced transverse fracture of the left patella. Correlate clinically. No other evidence of acute fracture or dislocation in either knee. 6. 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 Electronically Signed   By: Carey Bullocks M.D.   On: 04/25/2023 12:56   DG Humerus Right  Result Date: 04/25/2023 CLINICAL DATA:  Status post fall. EXAM: RIGHT HUMERUS - 2+ VIEW COMPARISON:  None Available. FINDINGS: There is an acute fracture deformity involving the distal diaphysis of the right humerus. There is proximal and medial displacement with medial angulation of the distal fracture fragments. Severe degenerative changes identified within the glenohumeral joint. IMPRESSION: Acute fracture deformity involves the distal diaphysis of the right humerus. Electronically Signed   By: Signa Kell M.D.   On: 04/25/2023 12:53   DG Chest  Portable 1 View  Result Date: 04/25/2023 CLINICAL DATA:  Larey Seat at home. EXAM: PORTABLE CHEST 1 VIEW COMPARISON:  None Available. FINDINGS: Cardiac silhouette borderline enlarged. No mediastinal or hilar masses. Clear lungs. No gross pneumothorax or pleural effusion on this supine exam. Right glenohumeral joint space narrowing. Questionable fracture or chronic findings of the right humeral head. This will be further evaluated with dedicated radiographs. IMPRESSION: 1. No acute cardiopulmonary disease. Electronically Signed   By: Amie Portland M.D.   On: 04/25/2023 12:50    Pending Labs Unresulted Labs (From admission, onward)     Start     Ordered   04/26/23 0500  Basic metabolic panel  Tomorrow morning,   R        04/25/23 1447   04/26/23 0500  CBC  Tomorrow morning,   R        04/25/23 1447   04/25/23 1447  Type and screen [facility]  Once,   R       Comments: [facility]    04/25/23 1447  Vitals/Pain Today's Vitals   04/25/23 1108 04/25/23 1108 04/25/23 1200 04/25/23 1452  BP:   (!) 161/93   Pulse:   77   Resp:  16    Temp: 97.7 F (36.5 C)   97.8 F (36.6 C)  TempSrc: Oral     SpO2:   98%   PainSc:        Isolation Precautions No active isolations  Medications Medications  fentaNYL (SUBLIMAZE) injection 25 mcg (has no administration in time range)  enoxaparin (LOVENOX) injection 40 mg (has no administration in time range)  senna-docusate (Senokot-S) tablet 1 tablet (has no administration in time range)  pravastatin (PRAVACHOL) tablet 20 mg (has no administration in time range)  hydrALAZINE (APRESOLINE) injection 10 mg (has no administration in time range)  buPROPion (WELLBUTRIN XL) 24 hr tablet 300 mg (has no administration in time range)  escitalopram (LEXAPRO) tablet 20 mg (has no administration in time range)  pantoprazole (PROTONIX) EC tablet 40 mg (has no administration in time range)  ferrous sulfate tablet 325 mg  (has no administration in time range)  LORazepam (ATIVAN) tablet 1 mg (has no administration in time range)  losartan (COZAAR) tablet 50 mg (has no administration in time range)  hydrochlorothiazide (HYDRODIURIL) tablet 12.5 mg (has no administration in time range)  fentaNYL (SUBLIMAZE) injection 25 mcg (25 mcg Intravenous Given 04/25/23 1316)  lidocaine (PF) (XYLOCAINE) 1 % injection 5 mL (5 mLs Other Given 04/25/23 1500)  acetaminophen (TYLENOL) tablet 1,000 mg (1,000 mg Oral Given 04/25/23 1421)    Mobility non-ambulatory     Focused Assessments    R Recommendations: See Admitting Provider Note  Report given to:   Additional Notes:

## 2023-04-25 NOTE — Progress Notes (Signed)
Orthopedic Tech Progress Note Patient Details:  Morgan Potter 09-01-1950 [id number]  Well-padded plaster coaptation splint applied to RUE followed by a sling. Motion and sensation of digits remain intact. Pt states pain has improved and denies any tightness/discomfort from the splint at this time.  Ortho Devices Type of Ortho Device: Coapt, Arm sling Ortho Device/Splint Location: RUE Ortho Device/Splint Interventions: Ordered, Application, Adjustment   Post Interventions Patient Tolerated: Well, Fair Instructions Provided: Care of device, Adjustment of device  Hetty Linhart Carmine Savoy 04/25/2023, 7:02 PM

## 2023-04-25 NOTE — H&P (Addendum)
History and Physical    Patient: Morgan Potter:[id number] DOB: 1950/09/09 DOA: 04/25/2023 DOS: the patient was seen and examined on 04/25/2023 PCP: Medicine, [facility]  Patient coming from: Home via EMS  Chief Complaint:  Chief Complaint  Patient presents with   Fall   HPI: Morgan Potter is a 72 y.o. female with medical history significant of hypertension, hyperlipidemia, anxiety, GERD, and Jehovah witness who presents after having a fall. She was rushing out of her gate with coffee in hand when she tripped over a pot and fell on her face. She denies loss of consciousness, but was unable to get up and remained on the ground for approximately 15 minutes before she is able to notify another family member to get her daughter.  After a fall patient reported having severe pain in both arms right worse than the left as well as her knee.   Patient has a history of previous fractures, including breaking her shoulder, nose, and foot at when she worked at [employer] previously, and breaking her right shoulder a second time while at home.  She is a TEFL teacher Witness and declines blood products, but is okay for expanders and iron infusions if needed.  Patient's daughter may be able to get the card that states what is specifically okay to give.  She previously had issues with shortness of breath with taking morphine for which it is listed as an allergy.     In the emergency department patient was noted to be afebrile with blood pressures elevated up to 161/93.  Labs significant for WBC 12.5, hemoglobin 11.9, and glucose 122.  X-rays revealed acute fracture involving the distal diaphysis of the right humerus, communicated mildly displaced acute fracture of the left humeral head, and nondisplaced transverse fracture of the left patella.  CT imaging of the head and cervical spine did not note any acute intercranial or cervical abnormality.  Patient was also noted to have sustained a  laceration which was repaired by the ED provider.  Right eyebrow laceration was repaired with sutures by the ED physician.  Dr. Roda Shutters of orthopedics was consulted .  She had been given fentanyl IV without issue while in the emergency department.  Review of Systems: As mentioned in the history of present illness. All other systems reviewed and are negative. Past Medical History:  Diagnosis Date   Dizziness and giddiness    Dyspnea    Fatigue    Lightheadedness    Palpitations    SOB (shortness of breath)    Weakness    Past Surgical History:  Procedure Laterality Date   APPENDECTOMY  1976   CESAREAN SECTION  1981   CESAREAN SECTION  1969   cholecystectomy  73   Social History:  reports that she has never smoked. She has never used smokeless tobacco. No history on file for alcohol use and drug use.  Allergies  Allergen Reactions   Codeine    Morphine And Codeine    Sulfa Antibiotics     Family History  Problem Relation Age of Onset   Arrhythmia Mother    Heart attack Father    Cancer Father     Prior to Admission medications   Medication Sig Start Date End Date Taking? Authorizing Provider  buPROPion (WELLBUTRIN XL) 300 MG 24 hr tablet Take 300 mg by mouth daily.   Yes [provider]  Cholecalciferol 125 MCG (5000 UT) capsule Take 5,000 Units by mouth daily.   Yes [provider]  diclofenac (VOLTAREN) 75 MG EC tablet Take 75 mg by mouth 2 (two) times daily. 03/30/23 04/29/23 Yes [provider]  escitalopram (LEXAPRO) 20 MG tablet Take 20 mg by mouth daily.   Yes [provider]  esomeprazole (NEXIUM) 40 MG capsule Take 40 mg by mouth daily.   Yes [provider]  ferrous sulfate 325 (65 FE) MG tablet Take 325 mg by mouth daily with breakfast.   Yes [provider]  LORazepam (ATIVAN) 1 MG tablet Take 1 mg by mouth 2 (two) times daily. 02/10/19  Yes [provider]  losartan-hydrochlorothiazide (HYZAAR) 50-12.5  MG tablet TAKE 1 TABLET EVERY DAY 02/10/19  Yes [provider]  montelukast (SINGULAIR) 10 MG tablet Take 10 mg by mouth at bedtime.   Yes [provider]  pravastatin (PRAVACHOL) 20 MG tablet Take 20 mg by mouth at bedtime.   Yes [provider]  thiamine (VITAMIN B-1) 100 MG tablet Take 100 mg by mouth daily.   Yes [provider]    Physical Exam: Vitals:   04/25/23 1046 04/25/23 1108 04/25/23 1108 04/25/23 1200  BP:    (!) 161/93  Pulse: 83   77  Resp:   16   Temp:  97.7 F (36.5 C)    TempSrc:  Oral    SpO2: 100%   98%    Constitutional: Elderly female who appears to be anxious but able to follow commands Eyes: PERRL, sutures present of the right brow. ENMT: Mucous membranes are moist. air dentition. Neck: normal, supple Respiratory: clear to auscultation bilaterally, no wheezing, no crackles. Normal respiratory effort. No accessory muscle use.  Cardiovascular: Regular rate and rhythm, no murmurs / rubs / gallops. No extremity edema. 2+ pedal pulses. No carotid bruits.  Abdomen: no tenderness, no masses palpated. No hepatosplenomegaly. Bowel sounds positive.  Musculoskeletal: no clubbing / cyanosis.  Focal swelling noted of the right upper arm.  Also noted to have Skin: Abrasion appreciated to the left anterior knee. Neurologic: CN 2-12 grossly intact. Sensation intact, DTR normal. Strength 5/5 in all 4.  Psychiatric: Normal judgment and insight. Alert and oriented x 3. Normal mood.   Data Reviewed:  reviewed labs, imaging, and pertinent records as documented  Assessment and Plan:  Distal diaphysis of the right humerus, left humeral head, and left patellar fracture along with right eyebrow laceration secondary to fall Acute.  Patient presents after tripping over a teapot.  X-ray imaging revealed acute fracture involving the distal diaphysis of the right humerus, communicated mildly displaced acute fracture of the left humeral head, and  nondisplaced transverse fracture of the left patella -Admit to a telemetry bed -Hip fracture order set utilized -Splint to be placed in the right upper extremity -CT scan of the right shoulder without contrast -Tylenol/trial of tramadol/Fentanyl IV as needed for mild/moderate/severe pain respectively -Suture removal for eyebrow laceration recommended in 7 days -Appreciate orthopedic consultative services , will follow-up for any further recommendations  Leukocytosis WBC elevated at 12.2.  Chest x-ray was otherwise noted to be clear. Suspect secondary to fractures.   -Recheck CBC tomorrow morning  Normocytic anemia Hemoglobin 11.9. -Continue to monitor  Essential hypertension Blood pressures initially noted to be elevated up to 161/93. -Continue home blood pressure regimen -Hydralazine IV as needed for elevated blood pressures  Anxiety and depression -Continue current  medication regimen  Hyperlipidemia -Continue pravastatin  Jehovah witness Patient declines blood products, but reports being okay with expanders and iron infusions if needed.  DVT prophylaxis: Lovenox Advance Care Planning:   Code Status: Full Code   Consults: Orthopedics  Family Communication: Daughter updated at bedside  Severity of Illness: The appropriate patient status for this patient is INPATIENT. Inpatient status is judged to be reasonable and necessary in order to provide the required intensity of service to ensure the patient's safety. The patient's presenting symptoms, physical exam findings, and initial radiographic and laboratory data in the context of their chronic comorbidities is felt to place them at high risk for further clinical deterioration. Furthermore, it is not anticipated that the patient will be medically stable for discharge from the hospital within 2 midnights of admission.   * I certify that at the point of admission it is my clinical judgment that the patient will require inpatient  hospital care spanning beyond 2 midnights from the point of admission due to high intensity of service, high risk for further deterioration and high frequency of surveillance required.*  Author: Clydie Braun, MD 04/25/2023 2:12 PM  For on call review [email or web address].

## 2023-04-25 NOTE — Plan of Care (Signed)
°  Problem: Education: Goal: Knowledge of General Education information will improve Description: Including pain rating scale, medication(s)/side effects and non-pharmacologic comfort measures Outcome: Progressing   Problem: Clinical Measurements: Goal: Diagnostic test results will improve Outcome: Progressing Goal: Respiratory complications will improve Outcome: Progressing Goal: Cardiovascular complication will be avoided Outcome: Progressing   Problem: Activity: Goal: Risk for activity intolerance will decrease Outcome: Progressing   Problem: Nutrition: Goal: Adequate nutrition will be maintained Outcome: Progressing   Problem: Coping: Goal: Level of anxiety will decrease Outcome: Progressing

## 2023-04-26 DIAGNOSIS — S42351A Displaced comminuted fracture of shaft of humerus, right arm, initial encounter for closed fracture: Secondary | ICD-10-CM | POA: Diagnosis not present

## 2023-04-26 DIAGNOSIS — S01111A Laceration without foreign body of right eyelid and periocular area, initial encounter: Secondary | ICD-10-CM | POA: Diagnosis not present

## 2023-04-26 DIAGNOSIS — S42292A Other displaced fracture of upper end of left humerus, initial encounter for closed fracture: Secondary | ICD-10-CM | POA: Diagnosis not present

## 2023-04-26 DIAGNOSIS — W19XXXA Unspecified fall, initial encounter: Secondary | ICD-10-CM | POA: Diagnosis not present

## 2023-04-26 DIAGNOSIS — Y92009 Unspecified place in unspecified non-institutional (private) residence as the place of occurrence of the external cause: Secondary | ICD-10-CM

## 2023-04-26 LAB — NO BLOOD PRODUCTS

## 2023-04-26 LAB — CK: Total CK: 128 U/L (ref 38–234)

## 2023-04-26 LAB — CBC
HCT: 33.3 % — ABNORMAL LOW (ref 36.0–46.0)
Hemoglobin: 11 g/dL — ABNORMAL LOW (ref 12.0–15.0)
MCH: 30.1 pg (ref 26.0–34.0)
MCHC: 33 g/dL (ref 30.0–36.0)
MCV: 91.2 fL (ref 80.0–100.0)
Platelets: 281 10*3/uL (ref 150–400)
RBC: 3.65 MIL/uL — ABNORMAL LOW (ref 3.87–5.11)
RDW: 15.4 % (ref 11.5–15.5)
WBC: 9.7 10*3/uL (ref 4.0–10.5)
nRBC: 0 % (ref 0.0–0.2)

## 2023-04-26 LAB — BASIC METABOLIC PANEL
Anion gap: 9 (ref 5–15)
BUN: 21 mg/dL (ref 8–23)
CO2: 24 mmol/L (ref 22–32)
Calcium: 9.1 mg/dL (ref 8.9–10.3)
Chloride: 102 mmol/L (ref 98–111)
Creatinine, Ser: 1.03 mg/dL — ABNORMAL HIGH (ref 0.44–1.00)
GFR, Estimated: 57 mL/min — ABNORMAL LOW (ref >60)
Glucose, Bld: 123 mg/dL — ABNORMAL HIGH (ref 70–99)
Potassium: 3.9 mmol/L (ref 3.5–5.1)
Sodium: 135 mmol/L (ref 135–145)

## 2023-04-26 LAB — SURGICAL PCR SCREEN
MRSA, PCR: NEGATIVE
Staphylococcus aureus: NEGATIVE

## 2023-04-26 NOTE — Consult Note (Addendum)
ORTHOPAEDIC CONSULTATION  REQUESTING PHYSICIAN: Rai, Delene Ruffini, MD  Chief Complaint: bilateral arm and left knee pain  HPI: Morgan Potter is a 72 y.o. female who presents with bilateral arm and left knee pain after tripping over a flower pot and falling on her face yesterday.  She was taken to the ED where xrays of bilateral upper extremities and left knee were completed.  RUE showed a humeral shaft fracture, LUE a humeral neck fracture and left knee a transverse patella fracture.  She was placed in a coaptation splint and sling to the RUE.  She has been in a fair amount of pain since the injury.  Pain is worse with movement of BUE and LLE.  No complaints of numbness/tingling/burning.    Past Medical History:  Diagnosis Date   Dizziness and giddiness    Dyspnea    Fatigue    Lightheadedness    Palpitations    SOB (shortness of breath)    Weakness    Past Surgical History:  Procedure Laterality Date   APPENDECTOMY  1976   CESAREAN SECTION  1981   CESAREAN SECTION  1969   cholecystectomy  1976   Social History   Socioeconomic History   Marital status: Married    Spouse name: Not on file   Number of children: Not on file   Years of education: Not on file   Highest education level: Not on file  Occupational History   Not on file  Tobacco Use   Smoking status: Never   Smokeless tobacco: Never  Substance and Sexual Activity   Alcohol use: Not on file   Drug use: Not on file   Sexual activity: Not on file  Other Topics Concern   Not on file  Social History Narrative   Not on file   Social Determinants of Health   Financial Resource Strain: Low Risk  (08/11/2022)   Received from [facility]   Overall Financial Resource Strain (CARDIA)    Difficulty of Paying Living Expenses: Not hard at all  Food Insecurity: No Food Insecurity (08/11/2022)   Received from [facility]   Hunger Vital Sign    Worried About Running Out of Food in the Last Year: Never true    Ran  Out of Food in the Last Year: Never true  Transportation Needs: No Transportation Needs (08/11/2022)   Received from [facility] - Transportation    Lack of Transportation (Medical): No    Lack of Transportation (Non-Medical): No  Physical Activity: Insufficiently Active (08/10/2022)   Received from [facility]   Exercise Vital Sign    Days of Exercise per Week: 1 day    Minutes of Exercise per Session: 20 min  Stress: Stress Concern Present (08/10/2022)   Received from [facility] - Occupational Stress Questionnaire    Feeling of Stress : To some extent  Social Connections: Moderately Integrated (08/10/2022)   Received from [facility]   Social Network    How would you rate your social network (family, work, friends)?: Adequate participation with social networks   Family History  Problem Relation Age of Onset   Arrhythmia Mother    Heart attack Father    Cancer Father    - negative except otherwise stated in the family history section Allergies  Allergen Reactions   Codeine    Morphine And Codeine    Sulfa Antibiotics    Prior to Admission medications  Medication Sig Start Date End Date Taking? Authorizing Provider  buPROPion (WELLBUTRIN XL) 300 MG 24 hr tablet Take 300 mg by mouth daily.   Yes [provider]  Cholecalciferol 125 MCG (5000 UT) capsule Take 5,000 Units by mouth daily.   Yes [provider]  diclofenac (VOLTAREN) 75 MG EC tablet Take 75 mg by mouth 2 (two) times daily. 03/30/23 04/29/23 Yes [provider]  escitalopram (LEXAPRO) 20 MG tablet Take 20 mg by mouth daily.   Yes [provider]  esomeprazole (NEXIUM) 40 MG capsule Take 40 mg by mouth daily.   Yes [provider]  ferrous sulfate 325 (65 FE) MG tablet Take 325 mg by mouth daily with breakfast.   Yes [provider]  LORazepam (ATIVAN) 1 MG tablet Take 1 mg by mouth 2 (two) times daily.  02/10/19  Yes [provider]  losartan-hydrochlorothiazide (HYZAAR) 50-12.5 MG tablet TAKE 1 TABLET EVERY DAY 02/10/19  Yes [provider]  montelukast (SINGULAIR) 10 MG tablet Take 10 mg by mouth at bedtime.   Yes [provider]  pravastatin (PRAVACHOL) 20 MG tablet Take 20 mg by mouth at bedtime.   Yes [provider]  thiamine (VITAMIN B-1) 100 MG tablet Take 100 mg by mouth daily.   Yes [provider]   DG Humerus Right  Result Date: 04/25/2023 CLINICAL DATA:  Fracture EXAM: RIGHT HUMERUS - 2+ VIEW COMPARISON:  04/25/2023 FINDINGS: Frontal and lateral views of the right humerus are obtained. Casting material obscures underlying bony detail. The oblique distal right humeral diaphyseal fracture is again noted, with near anatomic alignment after reduction. Stable chronic posttraumatic and degenerative changes of the right shoulder. There is diffuse soft tissue swelling. IMPRESSION: 1. Interval reduction of the right humeral diaphyseal fracture, with near anatomic alignment. Electronically Signed   By: Sharlet Salina M.D.   On: 04/25/2023 21:20   CT SHOULDER RIGHT WO CONTRAST  Result Date: 04/25/2023 CLINICAL DATA:  Assess for fracture. EXAM: CT OF THE UPPER RIGHT EXTREMITY WITHOUT CONTRAST TECHNIQUE: Multidetector CT imaging of the upper right extremity was performed according to the standard protocol. RADIATION DOSE REDUCTION: This exam was performed according to the departmental dose-optimization program which includes automated exposure control, adjustment of the mA and/or kV according to patient size and/or use of iterative reconstruction technique. COMPARISON:  Current right shoulder radiographs. FINDINGS: Bones/Joint/Cartilage No acute fracture. Significant deformity of the right humeral head. Findings are suspected to be an old, healed fracture. There is secondary osteoarthritis with marked narrowing of the inferior glenohumeral joint space and  glenoid marginal spurring. Humeral articular surface is irregular. Mild AC joint osteoarthritis.  AC joint normally aligned. No bone lesion. Ligaments Suboptimally assessed by CT. Muscles and Tendons No evidence of muscle injury or atrophy. Tendons are grossly intact. Soft tissues No soft tissue edema or hemorrhage.  No mass. IMPRESSION: 1. No acute fracture. 2. Significant deformity of the right humeral head, suspected to be an old, healed fracture. Secondary osteoarthritis of the glenohumeral joint. Electronically Signed   By: Amie Portland M.D.   On: 04/25/2023 18:05   CT Head Wo Contrast  Result Date: 04/25/2023 CLINICAL DATA:  Head trauma, moderate-severe; Neck trauma (Age >= 65y) EXAM: CT HEAD WITHOUT CONTRAST CT CERVICAL SPINE WITHOUT CONTRAST TECHNIQUE: Multidetector CT imaging of the head and cervical spine was performed following the standard protocol without intravenous contrast. Multiplanar CT image reconstructions of the cervical spine were also generated. RADIATION DOSE REDUCTION: This exam was performed  according to the departmental dose-optimization program which includes automated exposure control, adjustment of the mA and/or kV according to patient size and/or use of iterative reconstruction technique. COMPARISON:  None Available. FINDINGS: CT HEAD FINDINGS Brain: No hemorrhage. No hydrocephalus. No extra-axial fluid collection. No CT evidence of an acute cortical infarct. No mass effect. No mass lesion. Vascular: No hyperdense vessel or unexpected calcification. Skull: Normal. Negative for fracture or focal lesion. Sinuses/Orbits: No middle ear or mastoid effusion. Paranasal sinuses are clear. Right lens replacement. Orbits are otherwise unremarkable. Other: None. CT CERVICAL SPINE FINDINGS Alignment: Normal. Skull base and vertebrae: Chronic compression deformity of the superior endplate of T1. 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 no acute cervical spine fracture Soft tissues and spinal canal: No  prevertebral fluid or swelling. No visible canal hematoma. Disc levels:  No CT evidence of high-grade spinal canal stenosis. Upper chest: Negative. Other: None IMPRESSION: 1. No CT evidence of intracranial injury. 2. No acute cervical spine fracture. 3. Chronic compression deformity of the superior endplate of T1. Electronically Signed   By: Lorenza Cambridge M.D.   On: 04/25/2023 12:57   CT Cervical Spine Wo Contrast  Result Date: 04/25/2023 CLINICAL DATA:  Head trauma, moderate-severe; Neck trauma (Age >= 65y) EXAM: CT HEAD WITHOUT CONTRAST CT CERVICAL SPINE WITHOUT CONTRAST TECHNIQUE: Multidetector CT imaging of the head and cervical spine was performed following the standard protocol without intravenous contrast. Multiplanar CT image reconstructions of the cervical spine were also generated. RADIATION DOSE REDUCTION: This exam was performed according to the departmental dose-optimization program which includes automated exposure control, adjustment of the mA and/or kV according to patient size and/or use of iterative reconstruction technique. COMPARISON:  None Available. FINDINGS: CT HEAD FINDINGS Brain: No hemorrhage. No hydrocephalus. No extra-axial fluid collection. No CT evidence of an acute cortical infarct. No mass effect. No mass lesion. Vascular: No hyperdense vessel or unexpected calcification. Skull: Normal. Negative for fracture or focal lesion. Sinuses/Orbits: No middle ear or mastoid effusion. Paranasal sinuses are clear. Right lens replacement. Orbits are otherwise unremarkable. Other: None. CT CERVICAL SPINE FINDINGS Alignment: Normal. Skull base and vertebrae: Chronic compression deformity of the superior endplate of T1. 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 no acute cervical spine fracture Soft tissues and spinal canal: No prevertebral fluid or swelling. No visible canal hematoma. Disc levels:  No CT evidence of high-grade spinal canal stenosis. Upper chest: Negative. Other: None IMPRESSION: 1. No CT  evidence of intracranial injury. 2. No acute cervical spine fracture. 3. Chronic compression deformity of the superior endplate of T1. Electronically Signed   By: Lorenza Cambridge M.D.   On: 04/25/2023 12:57   DG Pelvis 1-2 Views  Result Date: 04/25/2023 CLINICAL DATA:  Status post fall. Facial laceration with bilateral arm and knee pain. EXAM: LEFT KNEE - COMPLETE 4+ VIEW; PELVIS - 1-2 VIEW; RIGHT KNEE - COMPLETE 4+ VIEW; LEFT SHOULDER - 2+ VIEW; RIGHT SHOULDER - 2+ VIEW COMPARISON:  None relevant.  Concurrent chest radiographs. FINDINGS: Right shoulder: Right humeral radiographs are dictated separately. These better demonstrate an acute angulated and mildly comminuted fracture of the distal 3rd of the right humeral diaphysis. The right humeral head is abnormal with fragmentation, irregular sclerosis and possible anterior inferior dislocation. These findings are not favored to be acute and may reflect the sequela of an old or subacute injury. Correlate clinically. The right scapula appears intact. Left shoulder: There is a comminuted and mildly displaced acute fracture of the left humeral neck. The greater tuberosity is mildly displaced. Mild  underlying glenohumeral and acromioclavicular degenerative changes. No dislocation. One-view pelvis: 1208 hours. The bones appear mildly demineralized. No evidence of acute fracture or dislocation. Mild degenerative changes of both hips and sacroiliac joints. Surgical clips overlie the right sacroiliac joint. Right knee: The mineralization and alignment are normal. There is no evidence of acute fracture or dislocation. Mild tricompartmental degenerative changes. 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 No significant knee joint effusion or other focal soft tissue abnormality. Left knee: The mineralization and alignment are normal. There is suspicion of a nondisplaced transverse fracture of the patella, best seen on the externally rotated oblique view. This finding is not well  seen in the other projections. No other evidence of acute fracture or dislocation. There are tricompartmental degenerative changes with meniscal chondrocalcinosis and a small knee joint effusion. IMPRESSION: 1. Acute angulated and mildly comminuted fracture of the distal 3rd of the right humeral diaphysis. See separate report for right humerus. 2. Suspected chronic fracture of the right humeral head with fragmentation and possible anterior inferior dislocation. 3. Comminuted and mildly displaced acute fracture of the left humeral neck. 4. No evidence of acute pelvic fracture. 5. Suspected nondisplaced transverse fracture of the left patella. Correlate clinically. No other evidence of acute fracture or dislocation in either knee. 6. 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 Electronically Signed   By: Carey Bullocks M.D.   On: 04/25/2023 12:56   DG Shoulder Right  Result Date: 04/25/2023 CLINICAL DATA:  Status post fall. Facial laceration with bilateral arm and knee pain. EXAM: LEFT KNEE - COMPLETE 4+ VIEW; PELVIS - 1-2 VIEW; RIGHT KNEE - COMPLETE 4+ VIEW; LEFT SHOULDER - 2+ VIEW; RIGHT SHOULDER - 2+ VIEW COMPARISON:  None relevant.  Concurrent chest radiographs. FINDINGS: Right shoulder: Right humeral radiographs are dictated separately. These better demonstrate an acute angulated and mildly comminuted fracture of the distal 3rd of the right humeral diaphysis. The right humeral head is abnormal with fragmentation, irregular sclerosis and possible anterior inferior dislocation. These findings are not favored to be acute and may reflect the sequela of an old or subacute injury. Correlate clinically. The right scapula appears intact. Left shoulder: There is a comminuted and mildly displaced acute fracture of the left humeral neck. The greater tuberosity is mildly displaced. Mild underlying glenohumeral and acromioclavicular degenerative changes. No dislocation. One-view pelvis: 1208  hours. The bones appear mildly demineralized. No evidence of acute fracture or dislocation. Mild degenerative changes of both hips and sacroiliac joints. Surgical clips overlie the right sacroiliac joint. Right knee: The mineralization and alignment are normal. There is no evidence of acute fracture or dislocation. Mild tricompartmental degenerative changes. 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 No significant knee joint effusion or other focal soft tissue abnormality. Left knee: The mineralization and alignment are normal. There is suspicion of a nondisplaced transverse fracture of the patella, best seen on the externally rotated oblique view. This finding is not well seen in the other projections. No other evidence of acute fracture or dislocation. There are tricompartmental degenerative changes with meniscal chondrocalcinosis and a small knee joint effusion. IMPRESSION: 1. Acute angulated and mildly comminuted fracture of the distal 3rd of the right humeral diaphysis. See separate report for right humerus. 2. Suspected chronic fracture of the right humeral head with fragmentation and possible anterior inferior dislocation. 3. Comminuted and mildly displaced acute fracture of the left humeral neck. 4. No evidence of acute pelvic fracture. 5. Suspected nondisplaced transverse fracture of the left patella. Correlate clinically. No other evidence of acute fracture or  dislocation in either knee. 6. 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 Electronically Signed   By: Carey Bullocks M.D.   On: 04/25/2023 12:56   DG Shoulder Left  Result Date: 04/25/2023 CLINICAL DATA:  Status post fall. Facial laceration with bilateral arm and knee pain. EXAM: LEFT KNEE - COMPLETE 4+ VIEW; PELVIS - 1-2 VIEW; RIGHT KNEE - COMPLETE 4+ VIEW; LEFT SHOULDER - 2+ VIEW; RIGHT SHOULDER - 2+ VIEW COMPARISON:  None relevant.  Concurrent chest radiographs. FINDINGS: Right shoulder: Right humeral radiographs are  dictated separately. These better demonstrate an acute angulated and mildly comminuted fracture of the distal 3rd of the right humeral diaphysis. The right humeral head is abnormal with fragmentation, irregular sclerosis and possible anterior inferior dislocation. These findings are not favored to be acute and may reflect the sequela of an old or subacute injury. Correlate clinically. The right scapula appears intact. Left shoulder: There is a comminuted and mildly displaced acute fracture of the left humeral neck. The greater tuberosity is mildly displaced. Mild underlying glenohumeral and acromioclavicular degenerative changes. No dislocation. One-view pelvis: 1208 hours. The bones appear mildly demineralized. No evidence of acute fracture or dislocation. Mild degenerative changes of both hips and sacroiliac joints. Surgical clips overlie the right sacroiliac joint. Right knee: The mineralization and alignment are normal. There is no evidence of acute fracture or dislocation. Mild tricompartmental degenerative changes. 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 No significant knee joint effusion or other focal soft tissue abnormality. Left knee: The mineralization and alignment are normal. There is suspicion of a nondisplaced transverse fracture of the patella, best seen on the externally rotated oblique view. This finding is not well seen in the other projections. No other evidence of acute fracture or dislocation. There are tricompartmental degenerative changes with meniscal chondrocalcinosis and a small knee joint effusion. IMPRESSION: 1. Acute angulated and mildly comminuted fracture of the distal 3rd of the right humeral diaphysis. See separate report for right humerus. 2. Suspected chronic fracture of the right humeral head with fragmentation and possible anterior inferior dislocation. 3. Comminuted and mildly displaced acute fracture of the left humeral neck. 4. No evidence of acute pelvic fracture. 5. Suspected  nondisplaced transverse fracture of the left patella. Correlate clinically. No other evidence of acute fracture or dislocation in either knee. 6. 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 Electronically Signed   By: Carey Bullocks M.D.   On: 04/25/2023 12:56   DG Knee Complete 4 Views Right  Result Date: 04/25/2023 CLINICAL DATA:  Status post fall. Facial laceration with bilateral arm and knee pain. EXAM: LEFT KNEE - COMPLETE 4+ VIEW; PELVIS - 1-2 VIEW; RIGHT KNEE - COMPLETE 4+ VIEW; LEFT SHOULDER - 2+ VIEW; RIGHT SHOULDER - 2+ VIEW COMPARISON:  None relevant.  Concurrent chest radiographs. FINDINGS: Right shoulder: Right humeral radiographs are dictated separately. These better demonstrate an acute angulated and mildly comminuted fracture of the distal 3rd of the right humeral diaphysis. The right humeral head is abnormal with fragmentation, irregular sclerosis and possible anterior inferior dislocation. These findings are not favored to be acute and may reflect the sequela of an old or subacute injury. Correlate clinically. The right scapula appears intact. Left shoulder: There is a comminuted and mildly displaced acute fracture of the left humeral neck. The greater tuberosity is mildly displaced. Mild underlying glenohumeral and acromioclavicular degenerative changes. No dislocation. One-view pelvis: 1208 hours. The bones appear mildly demineralized. No evidence of acute fracture or dislocation. Mild degenerative changes of both hips and sacroiliac  joints. Surgical clips overlie the right sacroiliac joint. Right knee: The mineralization and alignment are normal. There is no evidence of acute fracture or dislocation. Mild tricompartmental degenerative changes. 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 No significant knee joint effusion or other focal soft tissue abnormality. Left knee: The mineralization and alignment are normal. There is suspicion of a nondisplaced transverse fracture  of the patella, best seen on the externally rotated oblique view. This finding is not well seen in the other projections. No other evidence of acute fracture or dislocation. There are tricompartmental degenerative changes with meniscal chondrocalcinosis and a small knee joint effusion. IMPRESSION: 1. Acute angulated and mildly comminuted fracture of the distal 3rd of the right humeral diaphysis. See separate report for right humerus. 2. Suspected chronic fracture of the right humeral head with fragmentation and possible anterior inferior dislocation. 3. Comminuted and mildly displaced acute fracture of the left humeral neck. 4. No evidence of acute pelvic fracture. 5. Suspected nondisplaced transverse fracture of the left patella. Correlate clinically. No other evidence of acute fracture or dislocation in either knee. 6. 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 Electronically Signed   By: Carey Bullocks M.D.   On: 04/25/2023 12:56   DG Knee Complete 4 Views Left  Result Date: 04/25/2023 CLINICAL DATA:  Status post fall. Facial laceration with bilateral arm and knee pain. EXAM: LEFT KNEE - COMPLETE 4+ VIEW; PELVIS - 1-2 VIEW; RIGHT KNEE - COMPLETE 4+ VIEW; LEFT SHOULDER - 2+ VIEW; RIGHT SHOULDER - 2+ VIEW COMPARISON:  None relevant.  Concurrent chest radiographs. FINDINGS: Right shoulder: Right humeral radiographs are dictated separately. These better demonstrate an acute angulated and mildly comminuted fracture of the distal 3rd of the right humeral diaphysis. The right humeral head is abnormal with fragmentation, irregular sclerosis and possible anterior inferior dislocation. These findings are not favored to be acute and may reflect the sequela of an old or subacute injury. Correlate clinically. The right scapula appears intact. Left shoulder: There is a comminuted and mildly displaced acute fracture of the left humeral neck. The greater tuberosity is mildly displaced. Mild  underlying glenohumeral and acromioclavicular degenerative changes. No dislocation. One-view pelvis: 1208 hours. The bones appear mildly demineralized. No evidence of acute fracture or dislocation. Mild degenerative changes of both hips and sacroiliac joints. Surgical clips overlie the right sacroiliac joint. Right knee: The mineralization and alignment are normal. There is no evidence of acute fracture or dislocation. Mild tricompartmental degenerative changes. 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 No significant knee joint effusion or other focal soft tissue abnormality. Left knee: The mineralization and alignment are normal. There is suspicion of a nondisplaced transverse fracture of the patella, best seen on the externally rotated oblique view. This finding is not well seen in the other projections. No other evidence of acute fracture or dislocation. There are tricompartmental degenerative changes with meniscal chondrocalcinosis and a small knee joint effusion. IMPRESSION: 1. Acute angulated and mildly comminuted fracture of the distal 3rd of the right humeral diaphysis. See separate report for right humerus. 2. Suspected chronic fracture of the right humeral head with fragmentation and possible anterior inferior dislocation. 3. Comminuted and mildly displaced acute fracture of the left humeral neck. 4. No evidence of acute pelvic fracture. 5. Suspected nondisplaced transverse fracture of the left patella. Correlate clinically. No other evidence of acute fracture or dislocation in either knee. 6. 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 Electronically Signed   By: Carey Bullocks M.D.   On: 04/25/2023 12:56   DG  Humerus Right  Result Date: 04/25/2023 CLINICAL DATA:  Status post fall. EXAM: RIGHT HUMERUS - 2+ VIEW COMPARISON:  None Available. FINDINGS: There is an acute fracture deformity involving the distal diaphysis of the right humerus. There is proximal and medial displacement  with medial angulation of the distal fracture fragments. Severe degenerative changes identified within the glenohumeral joint. IMPRESSION: Acute fracture deformity involves the distal diaphysis of the right humerus. Electronically Signed   By: Signa Kell M.D.   On: 04/25/2023 12:53   DG Chest Portable 1 View  Result Date: 04/25/2023 CLINICAL DATA:  Larey Seat at home. EXAM: PORTABLE CHEST 1 VIEW COMPARISON:  None Available. FINDINGS: Cardiac silhouette borderline enlarged. No mediastinal or hilar masses. Clear lungs. No gross pneumothorax or pleural effusion on this supine exam. Right glenohumeral joint space narrowing. Questionable fracture or chronic findings of the right humeral head. This will be further evaluated with dedicated radiographs. IMPRESSION: 1. No acute cardiopulmonary disease. Electronically Signed   By: Amie [place] M.D.   On: 04/25/2023 12:50   - pertinent xrays, CT, MRI studies were reviewed and independently interpreted  Positive ROS: All other systems have been reviewed and were otherwise negative with the exception of those mentioned in the HPI and as above.  Physical Exam: General: No acute distress Cardiovascular: No pedal edema Respiratory: No cyanosis, no use of accessory musculature GI: No organomegaly, abdomen is soft and non-tender Skin: No lesions in the area of chief complaint Neurologic: Sensation intact distally Psychiatric: Patient is at baseline mood and affect Lymphatic: No axillary or cervical lymphadenopathy  MUSCULOSKELETAL:  RUE- well-fitted coaptation splint and sling in place.  Radial nerve intact.   LUE- not wearing a sling.  No ecchymosis.  Ttp to the proximal shoulder.  She is NV intact distally LLE- mild effusion.  Pain with ROM of the knee.  Ttp over the patella.  Able to SLR against gravity.    Assessment: Right humeral shaft fracture Left humeral neck fracture ?Left knee patella fracture?  Plan: Right humeral shaft fracture- continue  coaptation splint/sling.  Will plan for surgical fixation this week.   Left humeral neck fracture- sling to be worn.  Ok for ROM at the elbow/wrist.  Will plan for surgical fixation this week Left knee- will order playmaker brace 0-60 degrees to be worn when out of bed.  If mobilizing well, may wean her out of this brace.  Thank you for the consult and the opportunity to see Ms. Zellman  N. Glee Arvin, MD [facility] 10:11 AM

## 2023-04-26 NOTE — Plan of Care (Signed)
  Problem: Activity: Goal: Risk for activity intolerance will decrease Outcome: Not Progressing   Problem: Coping: Goal: Level of anxiety will decrease Outcome: Not Progressing   Problem: Pain Management: Goal: General experience of comfort will improve Outcome: Not Progressing   Problem: Safety: Goal: Ability to remain free from injury will improve Outcome: Not Progressing   Problem: Skin Integrity: Goal: Risk for impaired skin integrity will decrease Outcome: Not Progressing

## 2023-04-26 NOTE — Progress Notes (Signed)
Orthopedic Tech Progress Note Patient Details:  [name] August 23, 1950 [id number]  Routine order for a playmaker brace called into [facility]. I did specify the range of motion to be set at 0-60 degrees.  Patient ID: Morgan Potter, female   DOB: 19-Mar-1951, 72 y.o.   MRN: [id number]  Docia Furl 04/26/2023, 10:55 AM

## 2023-04-26 NOTE — Progress Notes (Signed)
[facility]                                                                              Morgan Potter, is a 72 y.o. female, DOB - August 18, 1950, [id number] Admit date - 04/25/2023    Outpatient Primary MD for the patient is Medicine, [facility]  LOS - 1  days  Chief Complaint  Patient presents with   Fall       Brief summary   Patient is a 72 year old female with hypertension, hyperlipidemia, anxiety, GERD, Jehovah's Witness presented after mechanical fall.  Patient was rushing out of her gait with coffee and hand when she tripped over a pot and fell on her face.  No syncopal episode but was unable to get up and remained on the ground for approximately 15 minutes before she was able to notify her family.  Subsequently patient reported having severe pain in both arms, right worse than the left and left knee.  Of note, patient is a Jehovah's Witness, declines blood products but is okay for expanders and iron infusions if needed.   X-rays revealed acute fracture involving the distal diaphysis of the right humerus, communicated mildly displaced acute fracture of the left humeral head, and nondisplaced transverse fracture of the left patella.  CT imaging of the head and cervical spine did not note any acute intercranial or cervical abnormality.   Patient was also noted to have sustained a laceration which was repaired by the ED provider.    Assessment & Plan    Principal Problem: Right humeral shaft fracture Left humeral neck fracture -Orthopedics consulted, for right humeral shaft fracture, recommended coaptation splint/sling and will plan on surgical fixation For left humeral neck fracture, sling to be worn and okay for ROM at the elbow/wrist with plan for surgery this week -Continue pain control, bowel regimen, DVT prophylaxis -OR planning for surgery tomorrow, n.p.o. after midnight  Active problems Left patella nondisplaced  fracture -Orthopedics following, recommended nonoperative management, playmaker brace 0 to 60 degrees to be worn when out of bed    Eyebrow laceration, right -Eyebrow laceration was repaired by the ED provider, suture removal was recommended in 7 days  Leukocytosis -Resolved, likely reactive    Normocytic anemia -H&H stable, 11.0   Essential hypertension -BP stable, continue Cozaar, HCTZ  IV hydralazine as needed with parameters    Anxiety and depression -Continue Wellbutrin XL   Hyperlipidemia -Continue pravastatin   Jehovah witness Patient declines blood products, but reports being okay with expanders and iron infusions if needed.  Obesity Estimated body mass index is 33.82 kg/m as calculated from the following:   Height as of 06/24/19: 5\' 9"  (1.753 m).   Weight as of 06/24/19: 103.9 kg.  Code Status: Full code DVT Prophylaxis:  enoxaparin (LOVENOX) injection 40 mg Start: 04/25/23 1500   Level of Care: Level of care: Telemetry Medical Family Communication: Updated patient Disposition Plan:      Remains inpatient appropriate:      Procedures:  None  Consultants:   Orthopedics  Antimicrobials:   Anti-infectives (From admission, onward)    None  Medications  buPROPion  300 mg Oral Daily   diclofenac  75 mg Oral BID   enoxaparin (LOVENOX) injection  40 mg Subcutaneous Q24H   escitalopram  20 mg Oral Daily   ferrous sulfate  325 mg Oral Q breakfast   hydrochlorothiazide  12.5 mg Oral Daily   losartan  50 mg Oral Daily   pantoprazole  40 mg Oral Daily   pravastatin  20 mg Oral q1800      Subjective:   Morgan Potter was seen and examined today.  Eating breakfast with assistance, no acute complaints.  Pain is controlled.  Patient denies dizziness, chest pain, shortness of breath, abdominal pain, N/V  Objective:   Vitals:   04/25/23 2031 04/25/23 2347 04/26/23 0440 04/26/23 0717  BP: 134/78 (!) 147/95 (!) 144/83 (!) 139/90  Pulse: 100 (!)  104 89 85  Resp: 16 18 16 17   Temp: 98.9 F (37.2 C) 99.6 F (37.6 C) 99 F (37.2 C) 98.5 F (36.9 C)  TempSrc: Oral Oral Oral Oral  SpO2: 96% 96% 92% 93%    Intake/Output Summary (Last 24 hours) at 04/26/2023 1306 Last data filed at 04/26/2023 0900 Gross per 24 hour  Intake 590 ml  Output 400 ml  Net 190 ml     Wt Readings from Last 3 Encounters:  06/24/19 103.9 kg  06/10/19 103.9 kg     Exam General: Alert and oriented x 3, NAD, pleasant Cardiovascular: S1 S2 auscultated,  RRR Respiratory: Clear to auscultation bilaterally, no wheezing Gastrointestinal: Soft, nontender, nondistended, + bowel sounds Ext: no pedal edema bilaterally LE.  Splint and sling in place for RUE Neuro: no new deficits lower extremity Psych: Normal affect     Data Reviewed:  I have personally reviewed following labs    CBC Lab Results  Component Value Date   WBC 9.7 04/26/2023   RBC 3.65 (L) 04/26/2023   HGB 11.0 (L) 04/26/2023   HCT 33.3 (L) 04/26/2023   MCV 91.2 04/26/2023   MCH 30.1 04/26/2023   PLT 281 04/26/2023   MCHC 33.0 04/26/2023   RDW 15.4 04/26/2023   LYMPHSABS 1.6 04/25/2023   MONOABS 0.8 04/25/2023   EOSABS 0.1 04/25/2023   BASOSABS 0.1 04/25/2023     Last metabolic panel Lab Results  Component Value Date   NA 135 04/26/2023   K 3.9 04/26/2023   CL 102 04/26/2023   CO2 24 04/26/2023   BUN 21 04/26/2023   CREATININE 1.03 (H) 04/26/2023   GLUCOSE 123 (H) 04/26/2023   GFRNONAA 57 (L) 04/26/2023   CALCIUM 9.1 04/26/2023   ANIONGAP 9 04/26/2023    CBG (last 3)  No results for input(s): "GLUCAP" in the last 72 hours.    Coagulation Profile: Recent Labs  Lab 04/25/23 1245  INR 1.0     Radiology Studies: I have personally reviewed the imaging studies  DG Humerus Right  Result Date: 04/25/2023 CLINICAL DATA:  Fracture EXAM: RIGHT HUMERUS - 2+ VIEW COMPARISON:  04/25/2023 FINDINGS: Frontal and lateral views of the right humerus are obtained. Casting  material obscures underlying bony detail. The oblique distal right humeral diaphyseal fracture is again noted, with near anatomic alignment after reduction. Stable chronic posttraumatic and degenerative changes of the right shoulder. There is diffuse soft tissue swelling. IMPRESSION: 1. Interval reduction of the right humeral diaphyseal fracture, with near anatomic alignment. Electronically Signed   By: Sharlet Salina M.D.   On: 04/25/2023 21:20   CT SHOULDER RIGHT WO CONTRAST  Result Date:  04/25/2023 CLINICAL DATA:  Assess for fracture. EXAM: CT OF THE UPPER RIGHT EXTREMITY WITHOUT CONTRAST TECHNIQUE: Multidetector CT imaging of the upper right extremity was performed according to the standard protocol. RADIATION DOSE REDUCTION: This exam was performed according to the departmental dose-optimization program which includes automated exposure control, adjustment of the mA and/or kV according to patient size and/or use of iterative reconstruction technique. COMPARISON:  Current right shoulder radiographs. FINDINGS: Bones/Joint/Cartilage No acute fracture. Significant deformity of the right humeral head. Findings are suspected to be an old, healed fracture. There is secondary osteoarthritis with marked narrowing of the inferior glenohumeral joint space and glenoid marginal spurring. Humeral articular surface is irregular. Mild AC joint osteoarthritis.  AC joint normally aligned. No bone lesion. Ligaments Suboptimally assessed by CT. Muscles and Tendons No evidence of muscle injury or atrophy. Tendons are grossly intact. Soft tissues No soft tissue edema or hemorrhage.  No mass. IMPRESSION: 1. No acute fracture. 2. Significant deformity of the right humeral head, suspected to be an old, healed fracture. Secondary osteoarthritis of the glenohumeral joint. Electronically Signed   By: Amie Portland M.D.   On: 04/25/2023 18:05   CT Head Wo Contrast  Result Date: 04/25/2023 CLINICAL DATA:  Head trauma,  moderate-severe; Neck trauma (Age >= 65y) EXAM: CT HEAD WITHOUT CONTRAST CT CERVICAL SPINE WITHOUT CONTRAST TECHNIQUE: Multidetector CT imaging of the head and cervical spine was performed following the standard protocol without intravenous contrast. Multiplanar CT image reconstructions of the cervical spine were also generated. RADIATION DOSE REDUCTION: This exam was performed according to the departmental dose-optimization program which includes automated exposure control, adjustment of the mA and/or kV according to patient size and/or use of iterative reconstruction technique. COMPARISON:  None Available. FINDINGS: CT HEAD FINDINGS Brain: No hemorrhage. No hydrocephalus. No extra-axial fluid collection. No CT evidence of an acute cortical infarct. No mass effect. No mass lesion. Vascular: No hyperdense vessel or unexpected calcification. Skull: Normal. Negative for fracture or focal lesion. Sinuses/Orbits: No middle ear or mastoid effusion. Paranasal sinuses are clear. Right lens replacement. Orbits are otherwise unremarkable. Other: None. CT CERVICAL SPINE FINDINGS Alignment: Normal. Skull base and vertebrae: Chronic compression deformity of the superior endplate of T1. 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 no acute cervical spine fracture Soft tissues and spinal canal: No prevertebral fluid or swelling. No visible canal hematoma. Disc levels:  No CT evidence of high-grade spinal canal stenosis. Upper chest: Negative. Other: None IMPRESSION: 1. No CT evidence of intracranial injury. 2. No acute cervical spine fracture. 3. Chronic compression deformity of the superior endplate of T1. Electronically Signed   By: Lorenza Cambridge M.D.   On: 04/25/2023 12:57   CT Cervical Spine Wo Contrast  Result Date: 04/25/2023 CLINICAL DATA:  Head trauma, moderate-severe; Neck trauma (Age >= 65y) EXAM: CT HEAD WITHOUT CONTRAST CT CERVICAL SPINE WITHOUT CONTRAST TECHNIQUE: Multidetector CT imaging of the head and cervical spine was  performed following the standard protocol without intravenous contrast. Multiplanar CT image reconstructions of the cervical spine were also generated. RADIATION DOSE REDUCTION: This exam was performed according to the departmental dose-optimization program which includes automated exposure control, adjustment of the mA and/or kV according to patient size and/or use of iterative reconstruction technique. COMPARISON:  None Available. FINDINGS: CT HEAD FINDINGS Brain: No hemorrhage. No hydrocephalus. No extra-axial fluid collection. No CT evidence of an acute cortical infarct. No mass effect. No mass lesion. Vascular: No hyperdense vessel or unexpected calcification. Skull: Normal. Negative for fracture or focal lesion. Sinuses/Orbits: No  middle ear or mastoid effusion. Paranasal sinuses are clear. Right lens replacement. Orbits are otherwise unremarkable. Other: None. CT CERVICAL SPINE FINDINGS Alignment: Normal. Skull base and vertebrae: Chronic compression deformity of the superior endplate of T1. 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 no acute cervical spine fracture Soft tissues and spinal canal: No prevertebral fluid or swelling. No visible canal hematoma. Disc levels:  No CT evidence of high-grade spinal canal stenosis. Upper chest: Negative. Other: None IMPRESSION: 1. No CT evidence of intracranial injury. 2. No acute cervical spine fracture. 3. Chronic compression deformity of the superior endplate of T1. Electronically Signed   By: Lorenza Cambridge M.D.   On: 04/25/2023 12:57   DG Pelvis 1-2 Views  Result Date: 04/25/2023 CLINICAL DATA:  Status post fall. Facial laceration with bilateral arm and knee pain. EXAM: LEFT KNEE - COMPLETE 4+ VIEW; PELVIS - 1-2 VIEW; RIGHT KNEE - COMPLETE 4+ VIEW; LEFT SHOULDER - 2+ VIEW; RIGHT SHOULDER - 2+ VIEW COMPARISON:  None relevant.  Concurrent chest radiographs. FINDINGS: Right shoulder: Right humeral radiographs are dictated separately. These better demonstrate an acute angulated and  mildly comminuted fracture of the distal 3rd of the right humeral diaphysis. The right humeral head is abnormal with fragmentation, irregular sclerosis and possible anterior inferior dislocation. These findings are not favored to be acute and may reflect the sequela of an old or subacute injury. Correlate clinically. The right scapula appears intact. Left shoulder: There is a comminuted and mildly displaced acute fracture of the left humeral neck. The greater tuberosity is mildly displaced. Mild underlying glenohumeral and acromioclavicular degenerative changes. No dislocation. One-view pelvis: 1208 hours. The bones appear mildly demineralized. No evidence of acute fracture or dislocation. Mild degenerative changes of both hips and sacroiliac joints. Surgical clips overlie the right sacroiliac joint. Right knee: The mineralization and alignment are normal. There is no evidence of acute fracture or dislocation. Mild tricompartmental degenerative changes. 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 No significant knee joint effusion or other focal soft tissue abnormality. Left knee: The mineralization and alignment are normal. There is suspicion of a nondisplaced transverse fracture of the patella, best seen on the externally rotated oblique view. This finding is not well seen in the other projections. No other evidence of acute fracture or dislocation. There are tricompartmental degenerative changes with meniscal chondrocalcinosis and a small knee joint effusion. IMPRESSION: 1. Acute angulated and mildly comminuted fracture of the distal 3rd of the right humeral diaphysis. See separate report for right humerus. 2. Suspected chronic fracture of the right humeral head with fragmentation and possible anterior inferior dislocation. 3. Comminuted and mildly displaced acute fracture of the left humeral neck. 4. No evidence of acute pelvic fracture. 5. Suspected nondisplaced transverse fracture of the left patella. Correlate  clinically. No other evidence of acute fracture or dislocation in either knee. 6. 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 Electronically Signed   By: Carey Bullocks M.D.   On: 04/25/2023 12:56   DG Shoulder Right  Result Date: 04/25/2023 CLINICAL DATA:  Status post fall. Facial laceration with bilateral arm and knee pain. EXAM: LEFT KNEE - COMPLETE 4+ VIEW; PELVIS - 1-2 VIEW; RIGHT KNEE - COMPLETE 4+ VIEW; LEFT SHOULDER - 2+ VIEW; RIGHT SHOULDER - 2+ VIEW COMPARISON:  None relevant.  Concurrent chest radiographs. FINDINGS: Right shoulder: Right humeral radiographs are dictated separately. These better demonstrate an acute angulated and mildly comminuted fracture of the distal 3rd of the right humeral diaphysis. The right humeral head is abnormal with fragmentation, irregular sclerosis  and possible anterior inferior dislocation. These findings are not favored to be acute and may reflect the sequela of an old or subacute injury. Correlate clinically. The right scapula appears intact. Left shoulder: There is a comminuted and mildly displaced acute fracture of the left humeral neck. The greater tuberosity is mildly displaced. Mild underlying glenohumeral and acromioclavicular degenerative changes. No dislocation. One-view pelvis: 1208 hours. The bones appear mildly demineralized. No evidence of acute fracture or dislocation. Mild degenerative changes of both hips and sacroiliac joints. Surgical clips overlie the right sacroiliac joint. Right knee: The mineralization and alignment are normal. There is no evidence of acute fracture or dislocation. Mild tricompartmental degenerative changes. 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 No significant knee joint effusion or other focal soft tissue abnormality. Left knee: The mineralization and alignment are normal. There is suspicion of a nondisplaced transverse fracture of the patella, best seen on the externally rotated oblique view. This finding  is not well seen in the other projections. No other evidence of acute fracture or dislocation. There are tricompartmental degenerative changes with meniscal chondrocalcinosis and a small knee joint effusion. IMPRESSION: 1. Acute angulated and mildly comminuted fracture of the distal 3rd of the right humeral diaphysis. See separate report for right humerus. 2. Suspected chronic fracture of the right humeral head with fragmentation and possible anterior inferior dislocation. 3. Comminuted and mildly displaced acute fracture of the left humeral neck. 4. No evidence of acute pelvic fracture. 5. Suspected nondisplaced transverse fracture of the left patella. Correlate clinically. No other evidence of acute fracture or dislocation in either knee. 6. 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 Electronically Signed   By: Carey Bullocks M.D.   On: 04/25/2023 12:56   DG Shoulder Left  Result Date: 04/25/2023 CLINICAL DATA:  Status post fall. Facial laceration with bilateral arm and knee pain. EXAM: LEFT KNEE - COMPLETE 4+ VIEW; PELVIS - 1-2 VIEW; RIGHT KNEE - COMPLETE 4+ VIEW; LEFT SHOULDER - 2+ VIEW; RIGHT SHOULDER - 2+ VIEW COMPARISON:  None relevant.  Concurrent chest radiographs. FINDINGS: Right shoulder: Right humeral radiographs are dictated separately. These better demonstrate an acute angulated and mildly comminuted fracture of the distal 3rd of the right humeral diaphysis. The right humeral head is abnormal with fragmentation, irregular sclerosis and possible anterior inferior dislocation. These findings are not favored to be acute and may reflect the sequela of an old or subacute injury. Correlate clinically. The right scapula appears intact. Left shoulder: There is a comminuted and mildly displaced acute fracture of the left humeral neck. The greater tuberosity is mildly displaced. Mild underlying glenohumeral and acromioclavicular degenerative changes. No dislocation. One-view  pelvis: 1208 hours. The bones appear mildly demineralized. No evidence of acute fracture or dislocation. Mild degenerative changes of both hips and sacroiliac joints. Surgical clips overlie the right sacroiliac joint. Right knee: The mineralization and alignment are normal. There is no evidence of acute fracture or dislocation. Mild tricompartmental degenerative changes. 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 No significant knee joint effusion or other focal soft tissue abnormality. Left knee: The mineralization and alignment are normal. There is suspicion of a nondisplaced transverse fracture of the patella, best seen on the externally rotated oblique view. This finding is not well seen in the other projections. No other evidence of acute fracture or dislocation. There are tricompartmental degenerative changes with meniscal chondrocalcinosis and a small knee joint effusion. IMPRESSION: 1. Acute angulated and mildly comminuted fracture of the distal 3rd of the right humeral diaphysis. See separate report for right  humerus. 2. Suspected chronic fracture of the right humeral head with fragmentation and possible anterior inferior dislocation. 3. Comminuted and mildly displaced acute fracture of the left humeral neck. 4. No evidence of acute pelvic fracture. 5. Suspected nondisplaced transverse fracture of the left patella. Correlate clinically. No other evidence of acute fracture or dislocation in either knee. 6. 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 Electronically Signed   By: Carey Bullocks M.D.   On: 04/25/2023 12:56   DG Knee Complete 4 Views Right  Result Date: 04/25/2023 CLINICAL DATA:  Status post fall. Facial laceration with bilateral arm and knee pain. EXAM: LEFT KNEE - COMPLETE 4+ VIEW; PELVIS - 1-2 VIEW; RIGHT KNEE - COMPLETE 4+ VIEW; LEFT SHOULDER - 2+ VIEW; RIGHT SHOULDER - 2+ VIEW COMPARISON:  None relevant.  Concurrent chest radiographs. FINDINGS: Right shoulder: Right  humeral radiographs are dictated separately. These better demonstrate an acute angulated and mildly comminuted fracture of the distal 3rd of the right humeral diaphysis. The right humeral head is abnormal with fragmentation, irregular sclerosis and possible anterior inferior dislocation. These findings are not favored to be acute and may reflect the sequela of an old or subacute injury. Correlate clinically. The right scapula appears intact. Left shoulder: There is a comminuted and mildly displaced acute fracture of the left humeral neck. The greater tuberosity is mildly displaced. Mild underlying glenohumeral and acromioclavicular degenerative changes. No dislocation. One-view pelvis: 1208 hours. The bones appear mildly demineralized. No evidence of acute fracture or dislocation. Mild degenerative changes of both hips and sacroiliac joints. Surgical clips overlie the right sacroiliac joint. Right knee: The mineralization and alignment are normal. There is no evidence of acute fracture or dislocation. Mild tricompartmental degenerative changes. 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 No significant knee joint effusion or other focal soft tissue abnormality. Left knee: The mineralization and alignment are normal. There is suspicion of a nondisplaced transverse fracture of the patella, best seen on the externally rotated oblique view. This finding is not well seen in the other projections. No other evidence of acute fracture or dislocation. There are tricompartmental degenerative changes with meniscal chondrocalcinosis and a small knee joint effusion. IMPRESSION: 1. Acute angulated and mildly comminuted fracture of the distal 3rd of the right humeral diaphysis. See separate report for right humerus. 2. Suspected chronic fracture of the right humeral head with fragmentation and possible anterior inferior dislocation. 3. Comminuted and mildly displaced acute fracture of the left humeral neck. 4. No evidence of acute pelvic  fracture. 5. Suspected nondisplaced transverse fracture of the left patella. Correlate clinically. No other evidence of acute fracture or dislocation in either knee. 6. 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 Electronically Signed   By: Carey Bullocks M.D.   On: 04/25/2023 12:56   DG Knee Complete 4 Views Left  Result Date: 04/25/2023 CLINICAL DATA:  Status post fall. Facial laceration with bilateral arm and knee pain. EXAM: LEFT KNEE - COMPLETE 4+ VIEW; PELVIS - 1-2 VIEW; RIGHT KNEE - COMPLETE 4+ VIEW; LEFT SHOULDER - 2+ VIEW; RIGHT SHOULDER - 2+ VIEW COMPARISON:  None relevant.  Concurrent chest radiographs. FINDINGS: Right shoulder: Right humeral radiographs are dictated separately. These better demonstrate an acute angulated and mildly comminuted fracture of the distal 3rd of the right humeral diaphysis. The right humeral head is abnormal with fragmentation, irregular sclerosis and possible anterior inferior dislocation. These findings are not favored to be acute and may reflect the sequela of an old or subacute injury. Correlate clinically. The right scapula  appears intact. Left shoulder: There is a comminuted and mildly displaced acute fracture of the left humeral neck. The greater tuberosity is mildly displaced. Mild underlying glenohumeral and acromioclavicular degenerative changes. No dislocation. One-view pelvis: 1208 hours. The bones appear mildly demineralized. No evidence of acute fracture or dislocation. Mild degenerative changes of both hips and sacroiliac joints. Surgical clips overlie the right sacroiliac joint. Right knee: The mineralization and alignment are normal. There is no evidence of acute fracture or dislocation. Mild tricompartmental degenerative changes. 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 No significant knee joint effusion or other focal soft tissue abnormality. Left knee: The mineralization and alignment are normal. There is suspicion of a nondisplaced  transverse fracture of the patella, best seen on the externally rotated oblique view. This finding is not well seen in the other projections. No other evidence of acute fracture or dislocation. There are tricompartmental degenerative changes with meniscal chondrocalcinosis and a small knee joint effusion. IMPRESSION: 1. Acute angulated and mildly comminuted fracture of the distal 3rd of the right humeral diaphysis. See separate report for right humerus. 2. Suspected chronic fracture of the right humeral head with fragmentation and possible anterior inferior dislocation. 3. Comminuted and mildly displaced acute fracture of the left humeral neck. 4. No evidence of acute pelvic fracture. 5. Suspected nondisplaced transverse fracture of the left patella. Correlate clinically. No other evidence of acute fracture or dislocation in either knee. 6. 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 Electronically Signed   By: Carey Bullocks M.D.   On: 04/25/2023 12:56   DG Humerus Right  Result Date: 04/25/2023 CLINICAL DATA:  Status post fall. EXAM: RIGHT HUMERUS - 2+ VIEW COMPARISON:  None Available. FINDINGS: There is an acute fracture deformity involving the distal diaphysis of the right humerus. There is proximal and medial displacement with medial angulation of the distal fracture fragments. Severe degenerative changes identified within the glenohumeral joint. IMPRESSION: Acute fracture deformity involves the distal diaphysis of the right humerus. Electronically Signed   By: Signa Kell M.D.   On: 04/25/2023 12:53   DG Chest Portable 1 View  Result Date: 04/25/2023 CLINICAL DATA:  Larey Seat at home. EXAM: PORTABLE CHEST 1 VIEW COMPARISON:  None Available. FINDINGS: Cardiac silhouette borderline enlarged. No mediastinal or hilar masses. Clear lungs. No gross pneumothorax or pleural effusion on this supine exam. Right glenohumeral joint space narrowing. Questionable fracture or chronic findings  of the right humeral head. This will be further evaluated with dedicated radiographs. IMPRESSION: 1. No acute cardiopulmonary disease. Electronically Signed   By: Amie Portland M.D.   On: 04/25/2023 12:50       Rembert Browe M.D. [facility] 04/26/2023, 1:06 PM  Available via Epic secure chat 7am-7pm After 7 pm, please refer to night coverage provider listed on amion.

## 2023-04-26 NOTE — Progress Notes (Signed)
Ortho Trauma Note  Was asked by Dr. Roda Shutters to assist in patients care regarding humeral shaft and left proximal humerus. Will order CT scan to assess L shoulder fracture to determine surgical management. Will tentatively plan for surgery tomorrow for R humeral shaft +/- L proximal humerus. Patella appears nondisplaced and will be treated nonoperatively. NPO at midnight.  Roby Lofts, MD [facility] [phone] (office) [email or web address]

## 2023-04-27 ENCOUNTER — Inpatient Hospital Stay (HOSPITAL_COMMUNITY): Payer: Medicare Other | Admitting: Anesthesiology

## 2023-04-27 ENCOUNTER — Encounter (HOSPITAL_COMMUNITY): Payer: Self-pay | Admitting: Internal Medicine

## 2023-04-27 ENCOUNTER — Inpatient Hospital Stay (HOSPITAL_COMMUNITY): Payer: Medicare Other

## 2023-04-27 ENCOUNTER — Other Ambulatory Visit: Payer: Self-pay

## 2023-04-27 ENCOUNTER — Encounter (HOSPITAL_COMMUNITY)
Admission: EM | Disposition: A | Discharge status: Rehabilitation Facility | Payer: Self-pay | Source: Home / Self Care | Attending: Internal Medicine

## 2023-04-27 DIAGNOSIS — S42301A Unspecified fracture of shaft of humerus, right arm, initial encounter for closed fracture: Secondary | ICD-10-CM | POA: Diagnosis not present

## 2023-04-27 DIAGNOSIS — S01111A Laceration without foreign body of right eyelid and periocular area, initial encounter: Secondary | ICD-10-CM | POA: Diagnosis not present

## 2023-04-27 DIAGNOSIS — S42292A Other displaced fracture of upper end of left humerus, initial encounter for closed fracture: Secondary | ICD-10-CM | POA: Diagnosis not present

## 2023-04-27 DIAGNOSIS — S42351A Displaced comminuted fracture of shaft of humerus, right arm, initial encounter for closed fracture: Secondary | ICD-10-CM | POA: Diagnosis not present

## 2023-04-27 DIAGNOSIS — W19XXXA Unspecified fall, initial encounter: Secondary | ICD-10-CM | POA: Diagnosis not present

## 2023-04-27 HISTORY — PX: ORIF HUMERUS FRACTURE: SHX2126

## 2023-04-27 LAB — CBC
HCT: 32.3 % — ABNORMAL LOW (ref 36.0–46.0)
Hemoglobin: 10.6 g/dL — ABNORMAL LOW (ref 12.0–15.0)
MCH: 30.2 pg (ref 26.0–34.0)
MCHC: 32.8 g/dL (ref 30.0–36.0)
MCV: 92 fL (ref 80.0–100.0)
Platelets: 245 10*3/uL (ref 150–400)
RBC: 3.51 MIL/uL — ABNORMAL LOW (ref 3.87–5.11)
RDW: 15.4 % (ref 11.5–15.5)
WBC: 9.5 10*3/uL (ref 4.0–10.5)
nRBC: 0 % (ref 0.0–0.2)

## 2023-04-27 LAB — BASIC METABOLIC PANEL
Anion gap: 9 (ref 5–15)
BUN: 17 mg/dL (ref 8–23)
CO2: 23 mmol/L (ref 22–32)
Calcium: 8.7 mg/dL — ABNORMAL LOW (ref 8.9–10.3)
Chloride: 103 mmol/L (ref 98–111)
Creatinine, Ser: 0.88 mg/dL (ref 0.44–1.00)
GFR, Estimated: >60 mL/min (ref >60)
Glucose, Bld: 111 mg/dL — ABNORMAL HIGH (ref 70–99)
Potassium: 3.6 mmol/L (ref 3.5–5.1)
Sodium: 135 mmol/L (ref 135–145)

## 2023-04-27 SURGERY — OPEN REDUCTION INTERNAL FIXATION (ORIF) HUMERAL SHAFT FRACTURE
Anesthesia: General | Site: Arm Upper | Laterality: Right

## 2023-04-27 MED ORDER — PHENYLEPHRINE 80 MCG/ML (10ML) SYRINGE FOR IV PUSH (FOR BLOOD PRESSURE SUPPORT)
PREFILLED_SYRINGE | INTRAVENOUS | Status: AC
  Filled 2023-04-27: qty 10

## 2023-04-27 MED ORDER — CHLORHEXIDINE GLUCONATE 0.12 % MT SOLN: 15.0000 mL | Freq: Once | OROMUCOSAL | Status: AC

## 2023-04-27 MED ORDER — FENTANYL CITRATE (PF) 250 MCG/5ML IJ SOLN
INTRAMUSCULAR | Status: AC
  Filled 2023-04-27: qty 5

## 2023-04-27 MED ORDER — VANCOMYCIN HCL 1000 MG IV SOLR
INTRAVENOUS | Status: AC
  Filled 2023-04-27: qty 20

## 2023-04-27 MED ORDER — BUPIVACAINE LIPOSOME 1.3 % IJ SUSP
INTRAMUSCULAR | Status: DC | PRN
  Administered 2023-04-27: 10 mL via PERINEURAL

## 2023-04-27 MED ORDER — SODIUM CHLORIDE 0.9 % IV SOLN: INTRAVENOUS | Status: DC

## 2023-04-27 MED ORDER — ACETAMINOPHEN 500 MG PO TABS
ORAL_TABLET | ORAL | Status: AC
  Filled 2023-04-27: qty 2

## 2023-04-27 MED ORDER — ROCURONIUM BROMIDE 10 MG/ML (PF) SYRINGE
PREFILLED_SYRINGE | INTRAVENOUS | Status: DC | PRN
  Administered 2023-04-27 (×2): 10 mg via INTRAVENOUS
  Administered 2023-04-27: 60 mg via INTRAVENOUS

## 2023-04-27 MED ORDER — LIDOCAINE 2% (20 MG/ML) 5 ML SYRINGE
INTRAMUSCULAR | Status: DC | PRN
  Administered 2023-04-27: 40 mg via INTRAVENOUS

## 2023-04-27 MED ORDER — 0.9 % SODIUM CHLORIDE (POUR BTL) OPTIME
TOPICAL | Status: DC | PRN
  Administered 2023-04-27: 1000 mL

## 2023-04-27 MED ORDER — METHOCARBAMOL 500 MG PO TABS
500.0000 mg | ORAL_TABLET | Freq: Four times a day (QID) | ORAL | Status: DC | PRN
  Administered 2023-04-27 – 2023-05-01 (×5): 500 mg via ORAL
  Filled 2023-04-27 (×5): qty 1

## 2023-04-27 MED ORDER — FENTANYL CITRATE (PF) 100 MCG/2ML IJ SOLN
INTRAMUSCULAR | Status: AC
  Administered 2023-04-27: 50 ug
  Filled 2023-04-27: qty 2

## 2023-04-27 MED ORDER — VANCOMYCIN HCL 1000 MG IV SOLR
INTRAVENOUS | Status: DC | PRN
  Administered 2023-04-27: 1000 mg via TOPICAL

## 2023-04-27 MED ORDER — DROPERIDOL 2.5 MG/ML IJ SOLN: 0.6250 mg | Freq: Once | INTRAMUSCULAR | Status: DC | PRN

## 2023-04-27 MED ORDER — METOCLOPRAMIDE HCL 5 MG/ML IJ SOLN: 5.0000 mg | Freq: Three times a day (TID) | INTRAMUSCULAR | Status: DC | PRN

## 2023-04-27 MED ORDER — METHOCARBAMOL 1000 MG/10ML IJ SOLN: 500.0000 mg | Freq: Four times a day (QID) | INTRAMUSCULAR | Status: DC | PRN

## 2023-04-27 MED ORDER — FENTANYL CITRATE (PF) 100 MCG/2ML IJ SOLN: 25.0000 ug | INTRAMUSCULAR | Status: DC | PRN

## 2023-04-27 MED ORDER — ONDANSETRON HCL 4 MG PO TABS: 4.0000 mg | ORAL_TABLET | Freq: Four times a day (QID) | ORAL | Status: DC | PRN

## 2023-04-27 MED ORDER — FENTANYL CITRATE (PF) 100 MCG/2ML IJ SOLN: 50.0000 ug | Freq: Once | INTRAMUSCULAR | Status: DC

## 2023-04-27 MED ORDER — DIPHENHYDRAMINE HCL 12.5 MG/5ML PO ELIX
12.5000 mg | ORAL_SOLUTION | ORAL | Status: DC | PRN
  Administered 2023-04-29: 25 mg via ORAL
  Filled 2023-04-27: qty 10

## 2023-04-27 MED ORDER — PHENYLEPHRINE 80 MCG/ML (10ML) SYRINGE FOR IV PUSH (FOR BLOOD PRESSURE SUPPORT)
PREFILLED_SYRINGE | INTRAVENOUS | Status: DC | PRN
  Administered 2023-04-27: 80 ug via INTRAVENOUS

## 2023-04-27 MED ORDER — DOCUSATE SODIUM 100 MG PO CAPS
100.0000 mg | ORAL_CAPSULE | Freq: Two times a day (BID) | ORAL | Status: DC
  Administered 2023-04-27 – 2023-05-01 (×9): 100 mg via ORAL
  Filled 2023-04-27 (×10): qty 1

## 2023-04-27 MED ORDER — OXYCODONE HCL 5 MG PO TABS: 5.0000 mg | ORAL_TABLET | Freq: Once | ORAL | Status: DC | PRN

## 2023-04-27 MED ORDER — FENTANYL CITRATE (PF) 250 MCG/5ML IJ SOLN
INTRAMUSCULAR | Status: DC | PRN
  Administered 2023-04-27: 50 ug via INTRAVENOUS
  Administered 2023-04-27: 100 ug via INTRAVENOUS

## 2023-04-27 MED ORDER — PROPOFOL 10 MG/ML IV BOLUS
INTRAVENOUS | Status: DC | PRN
  Administered 2023-04-27: 150 mg via INTRAVENOUS

## 2023-04-27 MED ORDER — ACETAMINOPHEN 500 MG PO TABS
1000.0000 mg | ORAL_TABLET | Freq: Once | ORAL | Status: AC
  Administered 2023-04-27: 1000 mg via ORAL

## 2023-04-27 MED ORDER — MIDAZOLAM HCL 2 MG/2ML IJ SOLN
INTRAMUSCULAR | Status: AC
  Filled 2023-04-27: qty 2

## 2023-04-27 MED ORDER — ORAL CARE MOUTH RINSE: 15.0000 mL | Freq: Once | OROMUCOSAL | Status: AC

## 2023-04-27 MED ORDER — CEFAZOLIN SODIUM-DEXTROSE 2-3 GM-%(50ML) IV SOLR
INTRAVENOUS | Status: DC | PRN
  Administered 2023-04-27: 2 g via INTRAVENOUS

## 2023-04-27 MED ORDER — PHENYLEPHRINE HCL-NACL 20-0.9 MG/250ML-% IV SOLN
INTRAVENOUS | Status: DC | PRN
  Administered 2023-04-27: 40 ug/min via INTRAVENOUS

## 2023-04-27 MED ORDER — ONDANSETRON HCL 4 MG/2ML IJ SOLN
INTRAMUSCULAR | Status: AC
  Filled 2023-04-27: qty 2

## 2023-04-27 MED ORDER — OXYCODONE HCL 5 MG/5ML PO SOLN: 5.0000 mg | Freq: Once | ORAL | Status: DC | PRN

## 2023-04-27 MED ORDER — LIDOCAINE 2% (20 MG/ML) 5 ML SYRINGE
INTRAMUSCULAR | Status: AC
  Filled 2023-04-27: qty 5

## 2023-04-27 MED ORDER — CEFAZOLIN SODIUM-DEXTROSE 2-4 GM/100ML-% IV SOLN
2.0000 g | Freq: Three times a day (TID) | INTRAVENOUS | Status: AC
  Administered 2023-04-27 – 2023-04-28 (×3): 2 g via INTRAVENOUS
  Filled 2023-04-27 (×3): qty 100

## 2023-04-27 MED ORDER — CEFAZOLIN SODIUM 1 G IJ SOLR
INTRAMUSCULAR | Status: AC
  Filled 2023-04-27: qty 20

## 2023-04-27 MED ORDER — BUPIVACAINE HCL (PF) 0.5 % IJ SOLN
INTRAMUSCULAR | Status: DC | PRN
  Administered 2023-04-27: 15 mL via PERINEURAL

## 2023-04-27 MED ORDER — METOCLOPRAMIDE HCL 5 MG PO TABS: 5.0000 mg | ORAL_TABLET | Freq: Three times a day (TID) | ORAL | Status: DC | PRN

## 2023-04-27 MED ORDER — ROCURONIUM BROMIDE 10 MG/ML (PF) SYRINGE
PREFILLED_SYRINGE | INTRAVENOUS | Status: AC
  Filled 2023-04-27: qty 10

## 2023-04-27 MED ORDER — ONDANSETRON HCL 4 MG/2ML IJ SOLN: 4.0000 mg | Freq: Four times a day (QID) | INTRAMUSCULAR | Status: DC | PRN

## 2023-04-27 MED ORDER — SUGAMMADEX SODIUM 200 MG/2ML IV SOLN
INTRAVENOUS | Status: DC | PRN
  Administered 2023-04-27: 200 mg via INTRAVENOUS

## 2023-04-27 MED ORDER — CHLORHEXIDINE GLUCONATE 0.12 % MT SOLN
OROMUCOSAL | Status: AC
  Administered 2023-04-27: 15 mL via OROMUCOSAL
  Filled 2023-04-27: qty 15

## 2023-04-27 MED ORDER — ONDANSETRON HCL 4 MG/2ML IJ SOLN
INTRAMUSCULAR | Status: DC | PRN
  Administered 2023-04-27: 4 mg via INTRAVENOUS

## 2023-04-27 SURGICAL SUPPLY — 60 items
BAG COUNTER SPONGE SURGICOUNT (BAG) ×2 IMPLANT
BIT DRILL QC 2X140 (BIT) IMPLANT
BIT DRILL QC SFS 2.5X170 (BIT) IMPLANT
BNDG COHESIVE 4X5 TAN STRL (GAUZE/BANDAGES/DRESSINGS) ×2 IMPLANT
BNDG ELASTIC 4X5.8 VLCR STR LF (GAUZE/BANDAGES/DRESSINGS) ×2 IMPLANT
BNDG ELASTIC 6X5.8 VLCR STR LF (GAUZE/BANDAGES/DRESSINGS) ×2 IMPLANT
BRUSH SCRUB EZ PLAIN DRY (MISCELLANEOUS) ×4 IMPLANT
CHLORAPREP W/TINT 26 (MISCELLANEOUS) ×2 IMPLANT
COVER SURGICAL LIGHT HANDLE (MISCELLANEOUS) ×4 IMPLANT
DERMABOND ADVANCED .7 DNX12 (GAUZE/BANDAGES/DRESSINGS) ×4 IMPLANT
DRAPE C-ARM 42X72 X-RAY (DRAPES) ×2 IMPLANT
DRAPE INCISE IOBAN 66X45 STRL (DRAPES) ×2 IMPLANT
DRAPE SURG 17X23 STRL (DRAPES) ×2 IMPLANT
DRAPE SURG ORHT 6 SPLT 77X108 (DRAPES) ×4 IMPLANT
DRAPE U-SHAPE 47X51 STRL (DRAPES) ×4 IMPLANT
DRSG MEPILEX POST OP 4X12 (GAUZE/BANDAGES/DRESSINGS) IMPLANT
DRSG MEPILEX POST OP 4X8 (GAUZE/BANDAGES/DRESSINGS) ×2 IMPLANT
ELECT REM PT RETURN 9FT ADLT (ELECTROSURGICAL) ×1 IMPLANT
ELECTRODE REM PT RTRN 9FT ADLT (ELECTROSURGICAL) ×2 IMPLANT
EVACUATOR 1/8 PVC DRAIN (DRAIN) IMPLANT
GAUZE PAD ABD 8X10 STRL (GAUZE/BANDAGES/DRESSINGS) ×2 IMPLANT
GLOVE BIO SURGEON STRL SZ 6.5 (GLOVE) ×6 IMPLANT
GLOVE BIO SURGEON STRL SZ7.5 (GLOVE) ×8 IMPLANT
GLOVE BIOGEL PI IND STRL 6.5 (GLOVE) ×2 IMPLANT
GLOVE BIOGEL PI IND STRL 7.5 (GLOVE) ×2 IMPLANT
GOWN STRL REUS W/ TWL LRG LVL3 (GOWN DISPOSABLE) ×4 IMPLANT
K-WIRE 1.6X150 (WIRE) ×1 IMPLANT
KIT BASIN OR (CUSTOM PROCEDURE TRAY) ×2 IMPLANT
KIT TURNOVER KIT B (KITS) ×2 IMPLANT
KWIRE 1.6X150 (WIRE) IMPLANT
MANIFOLD NEPTUNE II (INSTRUMENTS) ×2 IMPLANT
NDL HYPO 25X1 1.5 SAFETY (NEEDLE) ×2 IMPLANT
NEEDLE HYPO 25X1 1.5 SAFETY (NEEDLE) IMPLANT
NS IRRIG 1000ML POUR BTL (IV SOLUTION) ×2 IMPLANT
PACK ORTHO EXTREMITY (CUSTOM PROCEDURE TRAY) ×2 IMPLANT
PAD ARMBOARD 7.5X6 YLW CONV (MISCELLANEOUS) ×4 IMPLANT
PROS LCP PLATE 12 163M (Plate) ×1 IMPLANT
PROSTHESIS LCP PLATE 12 163M (Plate) IMPLANT
SCREW CORTEX 2.7X22 ST (Screw) IMPLANT
SCREW CORTEX 2.7X28 (Screw) IMPLANT
SCREW LOCK CORT ST 3.5X20 (Screw) IMPLANT
SCREW LOCK CORT ST 3.5X22 (Screw) IMPLANT
SCREW LOCK CORT ST 3.5X26 (Screw) IMPLANT
SCREW LOCK CORT ST 3.5X28 (Screw) IMPLANT
SCREW LOCK CORT ST 3.5X38 (Screw) IMPLANT
SPONGE T-LAP 18X18 ~~LOC~~+RFID (SPONGE) ×2 IMPLANT
STAPLER VISISTAT 35W (STAPLE) ×2 IMPLANT
SUCTION TUBE FRAZIER 10FR DISP (SUCTIONS) ×2 IMPLANT
SUT ETHILON 3 0 PS 1 (SUTURE) ×4 IMPLANT
SUT MNCRL AB 3-0 PS2 18 (SUTURE) ×2 IMPLANT
SUT PROLENE 0 CT (SUTURE) IMPLANT
SUT VIC AB 0 CT1 27XBRD ANBCTR (SUTURE) ×4 IMPLANT
SUT VIC AB 2-0 CT1 TAPERPNT 27 (SUTURE) ×4 IMPLANT
SYR CONTROL 10ML LL (SYRINGE) ×2 IMPLANT
TOWEL GREEN STERILE (TOWEL DISPOSABLE) ×6 IMPLANT
TOWEL GREEN STERILE FF (TOWEL DISPOSABLE) ×2 IMPLANT
TRAY FOLEY MTR SLVR 16FR STAT (SET/KITS/TRAYS/PACK) IMPLANT
TUBE CONNECTING 12X1/4 (SUCTIONS) ×2 IMPLANT
WATER STERILE IRR 1000ML POUR (IV SOLUTION) ×2 IMPLANT
YANKAUER SUCT BULB TIP NO VENT (SUCTIONS) IMPLANT

## 2023-04-27 NOTE — Transfer of Care (Signed)
Immediate Anesthesia Transfer of Care Note  Patient: Morgan Potter  Procedure(s) Performed: OPEN REDUCTION INTERNAL FIXATION (ORIF) HUMERAL SHAFT FRACTURE (Right: Arm Upper)  Patient Location: PACU  Anesthesia Type:General  Level of Consciousness: awake, drowsy, patient cooperative, and responds to stimulation  Airway & Oxygen Therapy: Patient Spontanous Breathing and Patient connected to nasal cannula oxygen  Post-op Assessment: Report given to RN and Post -op Vital signs reviewed and stable  Post vital signs: Reviewed and stable  Last Vitals:  Vitals Value Taken Time  BP    Temp    Pulse 90 04/27/23 1432  Resp 20 04/27/23 1432  SpO2 97 % 04/27/23 1432  Vitals shown include unfiled device data.  Last Pain:  Vitals:   04/27/23 1034  TempSrc:   PainSc: 0-No pain      Patients Stated Pain Goal: 0 (04/26/23 2219)  Complications: No notable events documented.

## 2023-04-27 NOTE — Anesthesia Procedure Notes (Signed)
Procedure Name: Intubation Date/Time: 04/27/2023 12:46 PM  Performed by: Ayesha Rumpf, CRNAPre-anesthesia Checklist: Patient identified, Emergency Drugs available, Suction available and Patient being monitored Patient Re-evaluated:Patient Re-evaluated prior to induction Oxygen Delivery Method: Circle System Utilized Preoxygenation: Pre-oxygenation with 100% oxygen Induction Type: IV induction Ventilation: Mask ventilation without difficulty Laryngoscope Size: Glidescope and 3 Grade View: Grade I Tube type: Oral Tube size: 7.0 mm Number of attempts: 1 Airway Equipment and Method: Stylet and Oral airway Placement Confirmation: ETT inserted through vocal cords under direct vision, positive ETCO2 and breath sounds checked- equal and bilateral Secured at: 21 cm Tube secured with: Tape Dental Injury: Teeth and Oropharynx as per pre-operative assessment

## 2023-04-27 NOTE — Anesthesia Procedure Notes (Signed)
Anesthesia Regional Block: Interscalene brachial plexus block   Pre-Anesthetic Checklist: , timeout performed,  Correct Patient, Correct Site, Correct Laterality,  Correct Procedure, Correct Position, site marked,  Risks and benefits discussed,  Surgical consent,  Pre-op evaluation,  At surgeon's request and post-op pain management  Laterality: Upper and Right  Prep: chloraprep       Needles:  Injection technique: Single-shot  Needle Type: Stimulator Needle - 40     Needle Length: 4cm  Needle Gauge: 22     Additional Needles:   Procedures:,,,, ultrasound used (permanent image in chart),,    Narrative:  Start time: 04/27/2023 11:48 AM End time: 04/27/2023 12:08 PM Injection made incrementally with aspirations every 5 mL.  Performed by: Personally  Anesthesiologist: Lewie Loron, MD  Additional Notes: BP cuff, SpO2 and EKG monitors applied. Sedation begun. Nerve location verified with ultrasound. Anesthetic injected incrementally, slowly, and after neg aspirations under direct u/s guidance. Good perineural spread. Tolerated well.

## 2023-04-27 NOTE — Anesthesia Preprocedure Evaluation (Addendum)
Anesthesia Evaluation  Patient identified by MRN, date of birth, ID band Patient awake    Reviewed: Allergy & Precautions, NPO status , Patient's Chart, lab work & pertinent test results  History of Anesthesia Complications Negative for: history of anesthetic complications  Airway Mallampati: II  TM Distance: >3 FB Neck ROM: Full    Dental no notable dental hx.    Pulmonary neg pulmonary ROS   Pulmonary exam normal        Cardiovascular hypertension, Pt. on medications Normal cardiovascular exam     Neuro/Psych   Anxiety Depression       GI/Hepatic Neg liver ROS,GERD  Medicated,,  Endo/Other  negative endocrine ROS    Renal/GU negative Renal ROS     Musculoskeletal  (+) Arthritis ,  right humerus fracture   Abdominal   Peds  Hematology  (+) Blood dyscrasia (Hgb 10.6), anemia , REFUSES BLOOD PRODUCTS  Anesthesia Other Findings Day of surgery medications reviewed with patient.  Reproductive/Obstetrics                             Anesthesia Physical Anesthesia Plan  ASA: 2  Anesthesia Plan: General   Post-op Pain Management: Regional block* and Tylenol PO (pre-op)*   Induction: Intravenous  PONV Risk Score and Plan: 3 and Ondansetron, Dexamethasone, Treatment may vary due to age or medical condition and Midazolam  Airway Management Planned: Oral ETT  Additional Equipment: None  Intra-op Plan:   Post-operative Plan: Extubation in OR  Informed Consent: I have reviewed the patients History and Physical, chart, labs and discussed the procedure including the risks, benefits and alternatives for the proposed anesthesia with the patient or authorized representative who has indicated his/her understanding and acceptance.     Dental advisory given  Plan Discussed with: CRNA  Anesthesia Plan Comments: (Risks of anesthesia explained at length. This includes, but is not limited to,  sore throat, damage to teeth, lips gums, tongue and vocal cords, nausea and vomiting, reactions to medications, stroke, heart attack, and death. All patient questions were answered and the patient wishes to proceed. Risks of peripheral nerve block explained at length. This includes, but is not limited to, bleeding, infection, reactions to the medications, seizures, damage to surrounding structures, damage to nerves, permanent weakness, numbness, tingling and pain. All patient questions were answered and patient wishes to proceed with nerve block. )       Anesthesia Quick Evaluation

## 2023-04-27 NOTE — Progress Notes (Signed)
[facility]                                                                              Morgan Potter, is a 72 y.o. female, DOB - [birth year], YQM:[id number] Admit date - 04/25/2023    Outpatient Primary MD for the patient is Medicine, [facility]  LOS - 2  days  Chief Complaint  Patient presents with   Fall       Brief summary   Patient is a 72 year old female with hypertension, hyperlipidemia, anxiety, GERD, Jehovah's Witness presented after mechanical fall.  Patient was rushing out of her gait with coffee and hand when she tripped over a pot and fell on her face.  No syncopal episode but was unable to get up and remained on the ground for approximately 15 minutes before she was able to notify her family.  Subsequently patient reported having severe pain in both arms, right worse than the left and left knee.  Of note, patient is a Jehovah's Witness, declines blood products but is okay for expanders and iron infusions if needed.  X-rays revealed acute fracture involving the distal diaphysis of the right humerus, communicated mildly displaced acute fracture of the left humeral head, and nondisplaced transverse fracture of the left patella.  CT imaging of the head and cervical spine did not note any acute intercranial or cervical abnormality.   Patient was also noted to have sustained a laceration which was repaired by the ED provider.    Assessment & Plan    Principal Problem: Right humeral shaft fracture Left humeral neck fracture -Orthopedics consulted -N.p.o., plan for open reduction internal fixation of the right humerus today, continue pain control -For left humeral fracture, per orthopedics, nonoperative management as high risk of failure due to bone quality -Continue pain control, bowel regimen and DVT prophylaxis   Active problems Left patella nondisplaced fracture -Orthopedics following, recommended nonoperative management,  playmaker brace 0 to 60 degrees to be worn when out of bed    Eyebrow laceration, right -Eyebrow laceration was repaired by the ED provider, suture removal was recommended in 7 days  Leukocytosis -Resolved, likely reactive    Normocytic anemia -H&H stable, will follow CBC   Essential hypertension -BP stable, continue Cozaar, HCTZ  -IV hydralazine as needed with parameters    Anxiety and depression -Continue Wellbutrin XL   Hyperlipidemia -Continue pravastatin   Jehovah witness Patient declines blood products, but reports being okay with expanders and iron infusions if needed.  Obesity Estimated body mass index is 33.23 kg/m as calculated from the following:   Height as of this encounter: 5\' 9"  (1.753 m).   Weight as of this encounter: 102.1 kg.  Code Status: Full code DVT Prophylaxis:  enoxaparin (LOVENOX) injection 40 mg Start: 04/25/23 1500   Level of Care: Level of care: Telemetry Medical Family Communication: Updated patient Disposition Plan:      Remains inpatient appropriate:   Plan for the OR today   Procedures:  None  Consultants:   Orthopedics  Antimicrobials:   Anti-infectives (From admission, onward)    None  Medications  acetaminophen       [MAR Hold] buPROPion  300 mg Oral Daily   [MAR Hold] diclofenac  75 mg Oral BID   [MAR Hold] enoxaparin (LOVENOX) injection  40 mg Subcutaneous Q24H   [MAR Hold] escitalopram  20 mg Oral Daily   fentaNYL (SUBLIMAZE) injection  50 mcg Intravenous Once   [MAR Hold] ferrous sulfate  325 mg Oral Q breakfast   [MAR Hold] hydrochlorothiazide  12.5 mg Oral Daily   [MAR Hold] losartan  50 mg Oral Daily   [MAR Hold] pantoprazole  40 mg Oral Daily   [MAR Hold] pravastatin  20 mg Oral q1800      Subjective:   Morgan Potter was seen and examined today AM.  No acute complaints, somewhat anxious for upcoming surgery today.  No acute chest pain, shortness of breath, dizziness, abdominal pain nausea  vomiting.  Objective:   Vitals:   04/27/23 1112 04/27/23 1157 04/27/23 1202 04/27/23 1207  BP:  (!) 154/73 (!) 154/75 (!) 165/72  Pulse:  85 83 83  Resp: 16 18 (!) 22 (!) 22  Temp:      TempSrc:      SpO2:  94% 93% 94%  Weight:      Height:        Intake/Output Summary (Last 24 hours) at 04/27/2023 1320 Last data filed at 04/27/2023 1309 Gross per 24 hour  Intake 700 ml  Output 600 ml  Net 100 ml     Wt Readings from Last 3 Encounters:  04/27/23 102.1 kg  06/24/19 103.9 kg  06/10/19 103.9 kg    Physical Exam General: Alert and oriented x 3, NAD, pleasant Cardiovascular: S1 S2 clear, RRR.  Respiratory: CTAB, no wheezing, rales or rhonchi Gastrointestinal: Soft, nontender, nondistended, NBS Ext: no pedal edema bilaterally, RUE in sling, LLE in brace Psych: Normal affect, pleasant    Data Reviewed:  I have personally reviewed following labs    CBC Lab Results  Component Value Date   WBC 9.5 04/27/2023   RBC 3.51 (L) 04/27/2023   HGB 10.6 (L) 04/27/2023   HCT 32.3 (L) 04/27/2023   MCV 92.0 04/27/2023   MCH 30.2 04/27/2023   PLT 245 04/27/2023   MCHC 32.8 04/27/2023   RDW 15.4 04/27/2023   LYMPHSABS 1.6 04/25/2023   MONOABS 0.8 04/25/2023   EOSABS 0.1 04/25/2023   BASOSABS 0.1 04/25/2023     Last metabolic panel Lab Results  Component Value Date   NA 135 04/27/2023   K 3.6 04/27/2023   CL 103 04/27/2023   CO2 23 04/27/2023   BUN 17 04/27/2023   CREATININE 0.88 04/27/2023   GLUCOSE 111 (H) 04/27/2023   GFRNONAA >60 04/27/2023   CALCIUM 8.7 (L) 04/27/2023   ANIONGAP 9 04/27/2023    CBG (last 3)  No results for input(s): "GLUCAP" in the last 72 hours.    Coagulation Profile: Recent Labs  Lab 04/25/23 1245  INR 1.0     Radiology Studies: I have personally reviewed the imaging studies  DG Humerus Right  Result Date: 04/25/2023 CLINICAL DATA:  Fracture EXAM: RIGHT HUMERUS - 2+ VIEW COMPARISON:  04/25/2023 FINDINGS: Frontal and lateral  views of the right humerus are obtained. Casting material obscures underlying bony detail. The oblique distal right humeral diaphyseal fracture is again noted, with near anatomic alignment after reduction. Stable chronic posttraumatic and degenerative changes of the right shoulder. There is diffuse soft tissue swelling. IMPRESSION: 1. Interval reduction of the right humeral diaphyseal fracture,  with near anatomic alignment. Electronically Signed   By: Sharlet Salina M.D.   On: 04/25/2023 21:20   CT SHOULDER RIGHT WO CONTRAST  Result Date: 04/25/2023 CLINICAL DATA:  Assess for fracture. EXAM: CT OF THE UPPER RIGHT EXTREMITY WITHOUT CONTRAST TECHNIQUE: Multidetector CT imaging of the upper right extremity was performed according to the standard protocol. RADIATION DOSE REDUCTION: This exam was performed according to the departmental dose-optimization program which includes automated exposure control, adjustment of the mA and/or kV according to patient size and/or use of iterative reconstruction technique. COMPARISON:  Current right shoulder radiographs. FINDINGS: Bones/Joint/Cartilage No acute fracture. Significant deformity of the right humeral head. Findings are suspected to be an old, healed fracture. There is secondary osteoarthritis with marked narrowing of the inferior glenohumeral joint space and glenoid marginal spurring. Humeral articular surface is irregular. Mild AC joint osteoarthritis.  AC joint normally aligned. No bone lesion. Ligaments Suboptimally assessed by CT. Muscles and Tendons No evidence of muscle injury or atrophy. Tendons are grossly intact. Soft tissues No soft tissue edema or hemorrhage.  No mass. IMPRESSION: 1. No acute fracture. 2. Significant deformity of the right humeral head, suspected to be an old, healed fracture. Secondary osteoarthritis of the glenohumeral joint. Electronically Signed   By: Amie Portland M.D.   On: 04/25/2023 18:05       Morgan Potter M.D. [facility] 04/27/2023, 1:20 PM  Available via Epic secure chat 7am-7pm After 7 pm, please refer to night coverage provider listed on amion.

## 2023-04-27 NOTE — Progress Notes (Incomplete)
   04/27/23 2208  Assess: MEWS Score  Temp (!) 102.1 F (38.9 C)  BP 137/69  MAP (mmHg) 86  Pulse Rate (!) 112  ECG Heart Rate (!) 113  Resp 20  Level of Consciousness Alert  SpO2 92 %  O2 Device Nasal Cannula  O2 Flow Rate (L/min) 2 L/min  Assess: MEWS Score  MEWS Temp 2  MEWS Systolic 0  MEWS Pulse 2  MEWS RR 0  MEWS LOC 0  MEWS Score 4  MEWS Score Color Red  Assess: SIRS CRITERIA  SIRS Temperature  1  SIRS Pulse 1  SIRS Respirations  0  SIRS WBC 0  SIRS Score Sum  2

## 2023-04-27 NOTE — H&P (View-Only) (Signed)
[facility]thopaedic Trauma Service (OTS) Consult   Patient ID: Morgan Potter MRN: [id number] DOB/AGE: 74-Jan-1952 74 y.o.  Reason for Consult:Right humeral shaft and left proximal humerus fracture Referring Physician: Dr. Glee Arvin, MD Cyn[facility] Skeeters  HPI: Morgan Potter is an 72 y.o. female who is being seen in consultation at the request of Dr. Roda Shutters for evaluation of right humeral shaft fracture left proximal humerus fracture as well as nondisplaced left patella fracture.  Patient was in her garden she tripped fell landing on bilateral upper extremities and sustained the above injuries.  To the OR availability Dr. Roda Shutters asked for assistance from orthopedic trauma service.  Patient was seen and evaluated in the preoperative holding area.  Currently having no pain in her right arm but having severe pain in her left shoulder.  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  This is bothered her some but has not sought any treatment for it.  She states that her left knee is hurting but it is not too bad and is getting better and she is able to move it and extend her knee.  She lives at home with her daughter and they are 4 children.  Past Medical History:  Diagnosis Date   Dizziness and giddiness    Dyspnea    Fatigue    Lightheadedness    Palpitations    SOB (shortness of breath)    Weakness     Past Surgical History:  Procedure Laterality Date   APPENDECTOMY  1976   CESAREAN SECTION  1981   CESAREAN SECTION  1969   cholecystectomy  1976    Family History  Problem Relation Age of Onset   Arrhythmia Mother    Heart attack Father    Cancer Father     Social History:  reports that she has never smoked. She has never used smokeless tobacco. No history on file for alcohol use and drug use.  Allergies:  Allergies  Allergen Reactions   Codeine    Morphine And Codeine    Sulfa  Antibiotics     Medications:  No current facility-administered medications on file prior to encounter.   Current Outpatient Medications on File Prior to Encounter  Medication Sig Dispense Refill   buPROPion (WELLBUTRIN XL) 300 MG 24 hr tablet Take 300 mg by mouth daily.     Cholecalciferol 125 MCG (5000 UT) capsule Take 5,000 Units by mouth daily.     diclofenac (VOLTAREN) 75 MG EC tablet Take 75 mg by mouth 2 (two) times daily.     escitalopram (LEXAPRO) 20 MG tablet Take 20 mg by mouth daily.     esomeprazole (NEXIUM) 40 MG capsule Take 40 mg by mouth daily.     ferrous sulfate 325 (65 FE) MG tablet Take 325 mg by mouth daily with breakfast.     LORazepam (ATIVAN) 1 MG tablet Take 1 mg by mouth 2 (two) times daily.     losartan-hydrochlorothiazide (HYZAAR) 50-12.5 MG tablet TAKE 1 TABLET EVERY DAY     montelukast (SINGULAIR) 10 MG tablet Take 10 mg by mouth at bedtime.     pravastatin (PRAVACHOL) 20 MG tablet Take 20 mg by mouth at bedtime.     thiamine (VITAMIN B-1) 100 MG tablet Take 100 mg by mouth daily.       ROS: Constitutional: No fever or chills Vision: No changes in vision  ENT: No difficulty swallowing CV: No chest pain Pulm: No SOB or wheezing GI: No nausea or vomiting GU: No urgency or inability to hold urine Skin: No poor wound healing Neurologic: No numbness or tingling Psychiatric: No depression or anxiety Heme: No bruising Allergic: No reaction to medications or food   Exam: Blood pressure 139/84, pulse 86, temperature 98.5 F (36.9 C), temperature source Oral, resp. rate 18, height 5\' 9"  (1.753 m), weight 102.1 kg, SpO2 93%. General: No acute distress Orientation: Awake alert oriented x 3 Mood and Affect: Cooperative and pleasant Gait: Unable to assess due to her fractures Coordination and balance: Within normal limits  Right upper extremity: Coaptation splint is in place is clean dry and intact compartments are soft compressible.  Patient has active  motor function to the median, radial and ulnar nerve distribution.  Sensation is intact to light touch to all nerve distributions in the right upper extremity she has a warm well-perfused hand with brisk cap refill.  I did not take her splint down for examination.  Left upper extremity: No sling in place.  She has significant ecchymosis and swelling through the arm.  She is able to bend and extend her elbow I did not attempt any active range of motion of the shoulder.  She has axillary nerve sensation intact to light touch.  Left lower extremity: Hinged knee brace is in place.  She is able to actively do a straight leg raise.  She has some mild tenderness through the patella but no significant ecchymosis or deformity.  Compartments are soft compressible.  Motor and sensory function intact.    Medical Decision Making: Data: Imaging: X-rays and CT scan of the left shoulder review a humeral neck fracture with associated greater tuberosity fracture with some mild displacement posteriorly  2 views of the right humerus show humeral shaft fracture at the mid to distal third with a spiral oblique shape.  No obvious intra-articular extension.  Labs:  Results for orders placed or performed during the hospital encounter of 04/25/23 (from the past 24 hour(s))  Surgical pcr screen     Status: None   Collection Time: 04/26/23 10:28 PM   Specimen: Nasal Mucosa; Nasal Swab  Result Value Ref Range   MRSA, PCR NEGATIVE NEGATIVE   Staphylococcus aureus NEGATIVE NEGATIVE  CBC     Status: Abnormal   Collection Time: 04/27/23  4:53 AM  Result Value Ref Range   WBC 9.5 4.0 - 10.5 K/uL   RBC 3.51 (L) 3.87 - 5.11 MIL/uL   Hemoglobin 10.6 (L) 12.0 - 15.0 g/dL   HCT 19.1 (L) 47.8 - 29.5 %   MCV 92.0 80.0 - 100.0 fL   MCH 30.2 26.0 - 34.0 pg   MCHC 32.8 30.0 - 36.0 g/dL   RDW 62.1 30.8 - 65.7 %   Platelets 245 150 - 400 K/uL   nRBC 0.0 0.0 - 0.2 %  Basic metabolic panel     Status: Abnormal   Collection Time:  04/27/23  4:53 AM  Result Value Ref Range   Sodium 135 135 - 145 mmol/L   Potassium 3.6 3.5 - 5.1 mmol/L   Chloride 103 98 - 111 mmol/L   CO2 23 22 - 32 mmol/L   Glucose, Bld 111 (H) 70 - 99 mg/dL   BUN 17 8 - 23 mg/dL   Creatinine, Ser 8.46 0.44 - 1.00 mg/dL   Calcium 8.7 (L) 8.9 - 10.3 mg/dL   GFR, Estimated >96 >29 mL/min  Anion gap 9 5 - 15     Imaging or Labs ordered: CT scan of left shoulder has been ordered and has been reviewed  Medical history and chart was reviewed and case discussed with medical provider.  Assessment/Plan: 72 year old female with right humeral shaft fracture and left proximal humerus fracture and a nondisplaced left patella fracture.  I discussed risks and benefits of proceeding with open reduction internal fixation of her right humerus.  Due to her bilateral upper extremity injuries I feel that fixation of the humerus allows immediate range of motion and mobility for ADLs.  I discussed with her the risk of nonunion.  I discussed other risks to surgical procedure being bleeding, infection, nerve and blood vessel injury, shoulder stiffness.  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  In regards to her left shoulder she has a nondisplaced humeral neck fracture with a mildly displaced greater tuberosity fracture.  I reviewed risks and benefits of surgical versus nonsurgical management.  I discussed with her that at this point the greater tuberosity has not displaced significantly.  I discussed the possibility of fixation however fixation and maintenance of the fixation was tenuous due to her bone quality and she would be at high risk for failure.  After full discussion of risks including stiffness hardware failure rotator cuff dysfunction as well as posttraumatic arthritis, we have recommended to proceed with nonoperative management.  Will place her in a sling and make her strictly  nonweightbearing with no motion of the shoulder for at least 2 weeks.  Will plan nonoperative management for her left knee.  Patient agreed to proceed with surgery of her right humeral shaft and all questions were answered.  Morgan Lofts, MD [facility] [phone] (office) [email or web address]

## 2023-04-27 NOTE — Op Note (Signed)
Orthopaedic Surgery Operative Note (CSN: [id number] ) Date of Surgery: 04/27/2023  Admit Date: 04/25/2023   Diagnoses: Pre-Op Diagnoses: Right humeral shaft fracture Left proximal humerus fracture   Post-Op Diagnosis: Same  Procedures: CPT 24515-Open reduction internal fixation of right humeral shaft fracture  Surgeons : Primary: Roby Lofts, MD  Assistant: Ulyses Southward, PA-C  Location: OR 3   Anesthesia: General   Antibiotics: Ancef 2g preop with 1 gm vancomycin powder placed topically   Tourniquet time: None    Estimated Blood Loss: 100 mL  Complications: None  Specimens:* No specimens in log *   Implants: Implant Name Type Inv. Item Serial No. Manufacturer Lot No. LRB No. Used Action  PROS LCP PLATE 12 [id number] - [id number] Plate PROS LCP PLATE 12 [id number]  DEPUY ORTHOPAEDICS ON TRAY Right 1 Implanted  SCREW CORTEX 2.7X22 ST - [id number] Screw SCREW CORTEX 2.7X22 ST  DEPUY ORTHOPAEDICS ON TRAY Right 1 Implanted  SCREW CORTEX 2.7X28 - [id number] Screw SCREW CORTEX 2.7X28  DEPUY ORTHOPAEDICS ON TRAY Right 1 Implanted  SCREW LOCK CORT ST 3.5X26 - [id number] Screw SCREW LOCK CORT ST 3.5X26  DEPUY ORTHOPAEDICS ON TRAY Right 5 Implanted  SCREW LOCK CORT ST 3.5X22 - [id number] Screw SCREW LOCK CORT ST 3.5X22  DEPUY ORTHOPAEDICS ON TRAY Right 2 Implanted  SCREW LOCK CORT ST 3.5X20 - [id number] Screw SCREW LOCK CORT ST 3.5X20  DEPUY ORTHOPAEDICS ON TRAY Right 1 Implanted  SCREW LOCK CORT ST 3.5X28 - [id number] Screw SCREW LOCK CORT ST 3.5X28  DEPUY ORTHOPAEDICS ON TRAY Right 1 Implanted     Indications for Surgery: 72 year old female who sustained a fall and a right humeral shaft fracture as well as a left proximal humerus fracture.  Full discussion of risks and benefits of open reduction internal fixation of both were discussed with the patient.  We decided to proceed with open reduction internal fixation of the right humerus and nonoperative management of the left proximal humerus.   Risks included but not limited to bleeding, infection, malunion, nonunion, hardware failure, hardware rotation, nerve and blood vessel injury, DVT, even the possibility anesthetic complications.  She agreed to proceed with surgery consent was obtained.  Operative Findings: Open reduction internal fixation of right humeral shaft fracture using Synthes 12 hole 3.5 mm LCP plate through an anterior approach  Procedure: The patient was identified in the preoperative holding area. Consent was confirmed with the patient and their family and all questions were answered. The operative extremity was marked after confirmation with the patient. she was then brought back to the operating room by our anesthesia colleagues.  She was placed under general anesthetic and carefully transferred over to radiolucent flattop table.  The right upper extremity was prepped and draped in usual sterile fashion.  A timeout was performed to verify the patient, the procedure, and the extremity.  Preoperative antibiotics were dosed.  Fluoroscopic imaging showed the unstable nature of her injury.  An anterior lateral approach was made and carried down through skin and subcutaneous tissue.  I incised through the biceps fascia and mobilized the biceps medially.  I then split the brachialis in line with my incision to visualize the fracture.  I did the interval lateral to the brachialis to visualize and protect the radial nerve.  The hematoma was cleaned out and I reduced the fracture anatomically using a reduction tenaculums.  I then proceeded to place positional screws using 2.7 millimeter screws from the Synthes set.  I was then able to remove the clamps  and then I used a 12 hole 3.5 mm LCP plate and positioned this anteriorly on the humeral shaft.  I did not contoured distally to fit the flange of the metaphysis of the distal humerus.  I placed 4 screws distally getting bicortical fixation and 5 screws proximally.  Excellent fixation  was obtained.  Final fluoroscopic imaging was obtained.  The incision was copiously irrigated.  A gram of vancomycin powder was placed into the incision.  A layered closure of 0 Vicryl, 2-0 Monocryl and 3-0 Monocryl with Dermabond was used to close the skin.  Sterile dressings were applied.  The patient was then awoke from anesthesia and taken to the PACU in stable condition.   Post Op Plan/Instructions: Nonweightbearing right upper and left upper extremity.  She will have unrestricted range of motion of the right elbow.  She has severe arthritic changes in her right shoulder and how we will have limited range of motion in the shoulder.  I would recommend no active or passive range of motion for the first 2 weeks on the left shoulder.  She should remain in the sling.  Will have her mobilize with physical and Occupational Therapy.  I would recommend Lovenox for DVT prophylaxis and discharged on aspirin 325 mg.  I was present and performed the entire surgery.  Ulyses Southward, PA-C did assist me throughout the case. An assistant was necessary given the difficulty in approach, maintenance of reduction and ability to instrument the fracture.   Truitt Merle, MD [facility]

## 2023-04-27 NOTE — Consult Note (Signed)
Orthopaedic Trauma Service (OTS) Consult   Patient ID: Morgan Potter MRN: [id number] DOB/AGE: 74-Jan-1952 74 y.o.  Reason for Consult:Right humeral shaft and left proximal humerus fracture Referring Physician: Dr. Glee Arvin, MD Cyndia Skeeters  HPI: Morgan Potter is an 72 y.o. female who is being seen in consultation at the request of Dr. Roda Shutters for evaluation of right humeral shaft fracture left proximal humerus fracture as well as nondisplaced left patella fracture.  Patient was in her garden she tripped fell landing on bilateral upper extremities and sustained the above injuries.  To the OR availability Dr. Roda Shutters asked for assistance from orthopedic trauma service.  Patient was seen and evaluated in the preoperative holding area.  Currently having no pain in her right arm but having severe pain in her left shoulder.  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  This is bothered her some but has not sought any treatment for it.  She states that her left knee is hurting but it is not too bad and is getting better and she is able to move it and extend her knee.  She lives at home with her daughter and they are 4 children.  Past Medical History:  Diagnosis Date   Dizziness and giddiness    Dyspnea    Fatigue    Lightheadedness    Palpitations    SOB (shortness of breath)    Weakness     Past Surgical History:  Procedure Laterality Date   APPENDECTOMY  1976   CESAREAN SECTION  1981   CESAREAN SECTION  1969   cholecystectomy  1976    Family History  Problem Relation Age of Onset   Arrhythmia Mother    Heart attack Father    Cancer Father     Social History:  reports that she has never smoked. She has never used smokeless tobacco. No history on file for alcohol use and drug use.  Allergies:  Allergies  Allergen Reactions   Codeine    Morphine And Codeine    Sulfa  Antibiotics     Medications:  No current facility-administered medications on file prior to encounter.   Current Outpatient Medications on File Prior to Encounter  Medication Sig Dispense Refill   buPROPion (WELLBUTRIN XL) 300 MG 24 hr tablet Take 300 mg by mouth daily.     Cholecalciferol 125 MCG (5000 UT) capsule Take 5,000 Units by mouth daily.     diclofenac (VOLTAREN) 75 MG EC tablet Take 75 mg by mouth 2 (two) times daily.     escitalopram (LEXAPRO) 20 MG tablet Take 20 mg by mouth daily.     esomeprazole (NEXIUM) 40 MG capsule Take 40 mg by mouth daily.     ferrous sulfate 325 (65 FE) MG tablet Take 325 mg by mouth daily with breakfast.     LORazepam (ATIVAN) 1 MG tablet Take 1 mg by mouth 2 (two) times daily.     losartan-hydrochlorothiazide (HYZAAR) 50-12.5 MG tablet TAKE 1 TABLET EVERY DAY     montelukast (SINGULAIR) 10 MG tablet Take 10 mg by mouth at bedtime.     pravastatin (PRAVACHOL) 20 MG tablet Take 20 mg by mouth at bedtime.     thiamine (VITAMIN B-1) 100 MG tablet Take 100 mg by mouth daily.       ROS: Constitutional: No fever or chills Vision: No changes in vision  ENT: No difficulty swallowing CV: No chest pain Pulm: No SOB or wheezing GI: No nausea or vomiting GU: No urgency or inability to hold urine Skin: No poor wound healing Neurologic: No numbness or tingling Psychiatric: No depression or anxiety Heme: No bruising Allergic: No reaction to medications or food   Exam: Blood pressure 139/84, pulse 86, temperature 98.5 F (36.9 C), temperature source Oral, resp. rate 18, height 5\' 9"  (1.753 m), weight 102.1 kg, SpO2 93%. General: No acute distress Orientation: Awake alert oriented x 3 Mood and Affect: Cooperative and pleasant Gait: Unable to assess due to her fractures Coordination and balance: Within normal limits  Right upper extremity: Coaptation splint is in place is clean dry and intact compartments are soft compressible.  Patient has active  motor function to the median, radial and ulnar nerve distribution.  Sensation is intact to light touch to all nerve distributions in the right upper extremity she has a warm well-perfused hand with brisk cap refill.  I did not take her splint down for examination.  Left upper extremity: No sling in place.  She has significant ecchymosis and swelling through the arm.  She is able to bend and extend her elbow I did not attempt any active range of motion of the shoulder.  She has axillary nerve sensation intact to light touch.  Left lower extremity: Hinged knee brace is in place.  She is able to actively do a straight leg raise.  She has some mild tenderness through the patella but no significant ecchymosis or deformity.  Compartments are soft compressible.  Motor and sensory function intact.    Medical Decision Making: Data: Imaging: X-rays and CT scan of the left shoulder review a humeral neck fracture with associated greater tuberosity fracture with some mild displacement posteriorly  2 views of the right humerus show humeral shaft fracture at the mid to distal third with a spiral oblique shape.  No obvious intra-articular extension.  Labs:  Results for orders placed or performed during the hospital encounter of 04/25/23 (from the past 24 hour(s))  Surgical pcr screen     Status: None   Collection Time: 04/26/23 10:28 PM   Specimen: Nasal Mucosa; Nasal Swab  Result Value Ref Range   MRSA, PCR NEGATIVE NEGATIVE   Staphylococcus aureus NEGATIVE NEGATIVE  CBC     Status: Abnormal   Collection Time: 04/27/23  4:53 AM  Result Value Ref Range   WBC 9.5 4.0 - 10.5 K/uL   RBC 3.51 (L) 3.87 - 5.11 MIL/uL   Hemoglobin 10.6 (L) 12.0 - 15.0 g/dL   HCT 19.1 (L) 47.8 - 29.5 %   MCV 92.0 80.0 - 100.0 fL   MCH 30.2 26.0 - 34.0 pg   MCHC 32.8 30.0 - 36.0 g/dL   RDW 62.1 30.8 - 65.7 %   Platelets 245 150 - 400 K/uL   nRBC 0.0 0.0 - 0.2 %  Basic metabolic panel     Status: Abnormal   Collection Time:  04/27/23  4:53 AM  Result Value Ref Range   Sodium 135 135 - 145 mmol/L   Potassium 3.6 3.5 - 5.1 mmol/L   Chloride 103 98 - 111 mmol/L   CO2 23 22 - 32 mmol/L   Glucose, Bld 111 (H) 70 - 99 mg/dL   BUN 17 8 - 23 mg/dL   Creatinine, Ser 8.46 0.44 - 1.00 mg/dL   Calcium 8.7 (L) 8.9 - 10.3 mg/dL   GFR, Estimated >96 >29 mL/min  Anion gap 9 5 - 15     Imaging or Labs ordered: CT scan of left shoulder has been ordered and has been reviewed  Medical history and chart was reviewed and case discussed with medical provider.  Assessment/Plan: 72 year old female with right humeral shaft fracture and left proximal humerus fracture and a nondisplaced left patella fracture.  I discussed risks and benefits of proceeding with open reduction internal fixation of her right humerus.  Due to her bilateral upper extremity injuries I feel that fixation of the humerus allows immediate range of motion and mobility for ADLs.  I discussed with her the risk of nonunion.  I discussed other risks to surgical procedure being bleeding, infection, nerve and blood vessel injury, shoulder stiffness.  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  In regards to her left shoulder she has a nondisplaced humeral neck fracture with a mildly displaced greater tuberosity fracture.  I reviewed risks and benefits of surgical versus nonsurgical management.  I discussed with her that at this point the greater tuberosity has not displaced significantly.  I discussed the possibility of fixation however fixation and maintenance of the fixation was tenuous due to her bone quality and she would be at high risk for failure.  After full discussion of risks including stiffness hardware failure rotator cuff dysfunction as well as posttraumatic arthritis, we have recommended to proceed with nonoperative management.  Will place her in a sling and make her strictly  nonweightbearing with no motion of the shoulder for at least 2 weeks.  Will plan nonoperative management for her left knee.  Patient agreed to proceed with surgery of her right humeral shaft and all questions were answered.  Roby Lofts, MD [facility] [phone] (office) [email or web address]

## 2023-04-27 NOTE — Interval H&P Note (Signed)
History and Physical Interval Note:  04/27/2023 11:43 AM  Morgan Potter  has presented today for surgery, with the diagnosis of right humerus fracture.  The various methods of treatment have been discussed with the patient and family. After consideration of risks, benefits and other options for treatment, the patient has consented to  Procedure(s): OPEN REDUCTION INTERNAL FIXATION (ORIF) HUMERAL SHAFT FRACTURE (Right) as a surgical intervention.  The patient's history has been reviewed, patient examined, no change in status, stable for surgery.  I have reviewed the patient's chart and labs.  Questions were answered to the patient's satisfaction.     Caryn Bee P Kelyn Ponciano

## 2023-04-27 NOTE — Anesthesia Postprocedure Evaluation (Signed)
Anesthesia Post Note  Patient: Morgan Potter  Procedure(s) Performed: OPEN REDUCTION INTERNAL FIXATION (ORIF) HUMERAL SHAFT FRACTURE (Right: Arm Upper)     Patient location during evaluation: PACU Anesthesia Type: General Level of consciousness: awake and alert Pain management: pain level controlled Vital Signs Assessment: post-procedure vital signs reviewed and stable Respiratory status: spontaneous breathing, nonlabored ventilation, respiratory function stable and patient connected to nasal cannula oxygen Cardiovascular status: blood pressure returned to baseline and stable Postop Assessment: no apparent nausea or vomiting Anesthetic complications: no   No notable events documented.  Last Vitals:  Vitals:   04/27/23 1500 04/27/23 1515  BP: 105/81 124/77  Pulse: 90 89  Resp: 20 20  Temp:    SpO2: 96% 95%    Last Pain:  Vitals:   04/27/23 1500  TempSrc:   PainSc: 0-No pain                 Trevor Iha

## 2023-04-27 NOTE — Progress Notes (Signed)
Transition of Care [facility]) - CAGE-AID Screening   Patient Details  Name: HAJAR FABRIZIO MRN: [id number] Date of Birth: [birth year]  Transition of Care [facility]) CM/SW Contact:    Leota Sauers, RN Phone Number: 04/27/2023, 5:09 AM   Clinical Narrative:  Patient denies use of alcohol and illicit substances. Resources not given at this time.  CAGE-AID Screening:    Have You Ever Felt You Ought to Cut Down on Your Drinking or Drug Use?: No Have People Annoyed You By Critizing Your Drinking Or Drug Use?: No Have You Felt Bad Or Guilty About Your Drinking Or Drug Use?: No Have You Ever Had a Drink or Used Drugs First Thing In The Morning to Steady Your Nerves or to Get Rid of a Hangover?: No CAGE-AID Score: 0  Substance Abuse Education Offered: No

## 2023-04-27 NOTE — Care Management Important Message (Signed)
Important Message  Patient Details  Name: Morgan Potter MRN: [id number] Date of Birth: 1951-01-19   Important Message Given:  Yes - Medicare IM     Sherilyn Banker 04/27/2023, 4:14 PM

## 2023-04-28 ENCOUNTER — Inpatient Hospital Stay (HOSPITAL_COMMUNITY): Payer: Medicare Other

## 2023-04-28 ENCOUNTER — Encounter (HOSPITAL_COMMUNITY): Payer: Self-pay | Admitting: Student

## 2023-04-28 DIAGNOSIS — S42351A Displaced comminuted fracture of shaft of humerus, right arm, initial encounter for closed fracture: Secondary | ICD-10-CM | POA: Diagnosis not present

## 2023-04-28 DIAGNOSIS — S82002A Unspecified fracture of left patella, initial encounter for closed fracture: Secondary | ICD-10-CM | POA: Diagnosis not present

## 2023-04-28 DIAGNOSIS — S42292A Other displaced fracture of upper end of left humerus, initial encounter for closed fracture: Secondary | ICD-10-CM | POA: Diagnosis not present

## 2023-04-28 DIAGNOSIS — W19XXXA Unspecified fall, initial encounter: Secondary | ICD-10-CM | POA: Diagnosis not present

## 2023-04-28 LAB — BASIC METABOLIC PANEL
Anion gap: 8 (ref 5–15)
BUN: 18 mg/dL (ref 8–23)
CO2: 24 mmol/L (ref 22–32)
Calcium: 8.7 mg/dL — ABNORMAL LOW (ref 8.9–10.3)
Chloride: 104 mmol/L (ref 98–111)
Creatinine, Ser: 1.01 mg/dL — ABNORMAL HIGH (ref 0.44–1.00)
GFR, Estimated: 58 mL/min — ABNORMAL LOW (ref >60)
Glucose, Bld: 111 mg/dL — ABNORMAL HIGH (ref 70–99)
Potassium: 3.8 mmol/L (ref 3.5–5.1)
Sodium: 136 mmol/L (ref 135–145)

## 2023-04-28 LAB — CBC
HCT: 31.4 % — ABNORMAL LOW (ref 36.0–46.0)
Hemoglobin: 10.1 g/dL — ABNORMAL LOW (ref 12.0–15.0)
MCH: 30.1 pg (ref 26.0–34.0)
MCHC: 32.2 g/dL (ref 30.0–36.0)
MCV: 93.5 fL (ref 80.0–100.0)
Platelets: 232 10*3/uL (ref 150–400)
RBC: 3.36 MIL/uL — ABNORMAL LOW (ref 3.87–5.11)
RDW: 15.5 % (ref 11.5–15.5)
WBC: 10.1 10*3/uL (ref 4.0–10.5)
nRBC: 0 % (ref 0.0–0.2)

## 2023-04-28 LAB — VITAMIN D 25 HYDROXY (VIT D DEFICIENCY, FRACTURES): Vit D, 25-Hydroxy: 40.58 ng/mL (ref 30–100)

## 2023-04-28 MED ORDER — HYDROMORPHONE HCL 1 MG/ML IJ SOLN
1.0000 mg | INTRAMUSCULAR | Status: DC | PRN
  Administered 2023-04-28 – 2023-04-29 (×2): 1 mg via INTRAVENOUS
  Filled 2023-04-28 (×2): qty 1

## 2023-04-28 MED ORDER — POLYETHYLENE GLYCOL 3350 17 G PO PACK
17.0000 g | PACK | Freq: Every day | ORAL | Status: DC
  Administered 2023-04-28 – 2023-04-30 (×3): 17 g via ORAL
  Filled 2023-04-28 (×5): qty 1

## 2023-04-28 MED ORDER — ACETAMINOPHEN 325 MG PO TABS
650.0000 mg | ORAL_TABLET | Freq: Four times a day (QID) | ORAL | Status: DC | PRN
  Administered 2023-04-29 – 2023-05-01 (×3): 650 mg via ORAL
  Filled 2023-04-28 (×3): qty 2

## 2023-04-28 MED ORDER — ACETAMINOPHEN 325 MG PO TABS
650.0000 mg | ORAL_TABLET | Freq: Four times a day (QID) | ORAL | Status: DC
  Administered 2023-04-28 (×2): 650 mg via ORAL
  Filled 2023-04-28 (×2): qty 2

## 2023-04-28 MED ORDER — HYDROCODONE-ACETAMINOPHEN 5-325 MG PO TABS
1.0000 | ORAL_TABLET | Freq: Four times a day (QID) | ORAL | Status: DC | PRN
  Administered 2023-04-29: 2 via ORAL
  Filled 2023-04-28: qty 2

## 2023-04-28 MED ORDER — SENNOSIDES-DOCUSATE SODIUM 8.6-50 MG PO TABS
2.0000 | ORAL_TABLET | Freq: Every day | ORAL | Status: DC
  Administered 2023-04-28 – 2023-05-01 (×3): 2 via ORAL
  Filled 2023-04-28 (×3): qty 2

## 2023-04-28 MED ORDER — VITAMIN D 25 MCG (1000 UNIT) PO TABS
1000.0000 [IU] | ORAL_TABLET | Freq: Every day | ORAL | Status: DC
  Administered 2023-04-28 – 2023-05-02 (×5): 1000 [IU] via ORAL
  Filled 2023-04-28 (×5): qty 1

## 2023-04-28 MED ORDER — HYDROCODONE-ACETAMINOPHEN 7.5-325 MG PO TABS
1.0000 | ORAL_TABLET | Freq: Four times a day (QID) | ORAL | Status: DC | PRN
  Administered 2023-04-28 – 2023-04-29 (×2): 2 via ORAL
  Filled 2023-04-28 (×2): qty 2

## 2023-04-28 MED ORDER — BISACODYL 10 MG RE SUPP: 10.0000 mg | Freq: Every day | RECTAL | Status: DC | PRN

## 2023-04-28 MED ORDER — TRAMADOL HCL 50 MG PO TABS
50.0000 mg | ORAL_TABLET | Freq: Four times a day (QID) | ORAL | Status: DC | PRN
  Administered 2023-04-28: 100 mg via ORAL
  Filled 2023-04-28: qty 2

## 2023-04-28 MED ORDER — IBUPROFEN 200 MG PO TABS: 400.0000 mg | ORAL_TABLET | Freq: Four times a day (QID) | ORAL | Status: DC | PRN

## 2023-04-28 NOTE — Evaluation (Signed)
Occupational Therapy Evaluation Patient Details Name: CRISOL MELIAN MRN: [id number] DOB: 12/27/1950 Today's Date: 04/28/2023   History of Present Illness Pt is a 72 y/o female presenting after fall.  Xrays with R UE humeral shaft fx, L UE humeral neck fx and L knee transverse patella fx.  S/P ORIF R humerus 12/9. Non op L UE in sling and L LE in knee brace.  PMH includes: SOB.   Clinical Impression   PTA patient reports independent and driving. Admitted for above and presents with problem list below.  Pt requires min assist for bed mobility, mod-max assist +2 to transition into standing, and min to total assist +2 for Adls.  She was educated on UE precautions (NWB and ROM as below), ADL compensatory techniques and safety.  Anticipate improved functional use of R UE for ADLs once nerve block wears off.  Based on performance today, believe pt will best benefit from continued OT services acutely and after dc at an inpatient setting with >3hrs/day to optimize independence, safety and return to PLOF with ADLs and mobility.      If plan is discharge home, recommend the following: Two people to help with walking and/or transfers;Two people to help with bathing/dressing/bathroom;Assistance with cooking/housework;Help with stairs or ramp for entrance;Assist for transportation    Functional Status Assessment  Patient has had a recent decline in their functional status and demonstrates the ability to make significant improvements in function in a reasonable and predictable amount of time.  Equipment Recommendations  Other (comment) (defer)    Recommendations for Other Services Rehab consult     Precautions / Restrictions Precautions Precautions: Fall;Shoulder Type of Shoulder Precautions: L shoulder no ROM, ok for elbow/wrist/hand; R UE unrestricted ROM Shoulder Interventions: Shoulder sling/immobilizer;At all times;Off for dressing/bathing/exercises (L UE) Precaution Booklet Issued: No Required  Braces or Orthoses: Sling;Other Brace Other Brace: knee brace L LE Restrictions Weight Bearing Restrictions: Yes RUE Weight Bearing: Non weight bearing LUE Weight Bearing: Non weight bearing LLE Weight Bearing: Weight bearing as tolerated Other Position/Activity Restrictions: L UE sling and no shoulder ROM, R UE ROM unrestricted; playmakers brace 0-60 L knee      Mobility Bed Mobility Overal bed mobility: Needs Assistance Bed Mobility: Supine to Sit     Supine to sit: Min assist     General bed mobility comments: HOB elevated, cueing for technique    Transfers Overall transfer level: Needs assistance Equipment used: None Transfers: Sit to/from Stand, Bed to chair/wheelchair/BSC Sit to Stand: Mod assist, Max assist, +2 physical assistance     Step pivot transfers: Min assist, +2 physical assistance     General transfer comment: cueing for technique, kicking L LE out due to discomfort; mod-max assist +2 to power up from EOB, recliner and BSC (easier from slightly elevated EOB) with min assist +2 to step to surface/      Balance Overall balance assessment: Needs assistance Sitting-balance support: No upper extremity supported, Feet supported Sitting balance-Leahy Scale: Fair     Standing balance support: No upper extremity supported, During functional activity Standing balance-Leahy Scale: Poor Standing balance comment: relies on external support                           ADL either performed or assessed with clinical judgement   ADL Overall ADL's : Needs assistance/impaired     Grooming: Sitting;Minimal assistance           Upper Body Dressing : Total  assistance;Sitting   Lower Body Dressing: Total assistance;+2 for physical assistance;Sit to/from stand   Toilet Transfer: Maximal assistance;+2 for physical assistance;Stand-pivot   Toileting- Clothing Manipulation and Hygiene: Total assistance;+2 for physical assistance;Sit to/from stand        Functional mobility during ADLs: +2 for physical assistance;Maximal assistance;Cueing for safety       Vision Baseline Vision/History: 1 Wears glasses Vision Assessment?: No apparent visual deficits     Perception         Praxis         Pertinent Vitals/Pain Pain Assessment Pain Assessment: Faces Faces Pain Scale: Hurts little more Pain Location: L knee, B UEs Pain Descriptors / Indicators: Discomfort, Grimacing, Guarding Pain Intervention(s): Limited activity within patient's tolerance, Monitored during session, Repositioned     Extremity/Trunk Assessment Upper Extremity Assessment Upper Extremity Assessment: Left hand dominant;RUE deficits/detail;LUE deficits/detail RUE Deficits / Details: s/p ORIF to humerus, unresticted ROM but nerve block in place.  PROM limited to elbow and shoulder, AROM supination and hand.  Decreased sensation RUE: Unable to fully assess due to pain RUE Sensation: decreased light touch RUE Coordination: decreased fine motor;decreased gross motor LUE Deficits / Details: in sling, no ROM at shoulder.  not formally assessed due to pain. Elbow ROM to wash face and WFL hand movement LUE: Unable to fully assess due to pain;Unable to fully assess due to immobilization LUE Sensation: WNL LUE Coordination: decreased gross motor   Lower Extremity Assessment Lower Extremity Assessment: Defer to PT evaluation   Cervical / Trunk Assessment Cervical / Trunk Assessment: Normal   Communication Communication Communication: No apparent difficulties   Cognition Arousal: Alert Behavior During Therapy: Anxious Overall Cognitive Status: Within Functional Limits for tasks assessed                                       General Comments  VSS on RA, educated on ROM precautions to UE and positoining of UE s    Exercises     Shoulder Instructions      Home Living Family/patient expects to be discharged to:: Private residence Living  Arrangements: Children;Other (Comment) (4 great grandchildren 62 y/o-15 y/o) Available Help at Discharge: Family Type of Home: House (mobile home in front and built onto in the back) Home Access: Stairs to enter Secretary/administrator of Steps: 2 Entrance Stairs-Rails: None Home Layout: One level     Bathroom Shower/Tub: Producer, television/film/video: Handicapped height     Home Equipment: Information systems manager   Additional Comments: possilby has wc      Prior Functioning/Environment Prior Level of Function : Independent/Modified Independent;Driving                        OT Problem List: Decreased strength;Decreased range of motion;Decreased activity tolerance;Impaired balance (sitting and/or standing);Decreased coordination;Decreased safety awareness;Decreased knowledge of use of DME or AE;Decreased knowledge of precautions;Impaired sensation;Impaired UE functional use;Obesity;Pain      OT Treatment/Interventions: Self-care/ADL training;Therapeutic exercise;DME and/or AE instruction;Therapeutic activities;Cognitive remediation/compensation;Patient/family education;Balance training    OT Goals(Current goals can be found in the care plan section) Acute Rehab OT Goals Patient Stated Goal: get better OT Goal Formulation: With patient Time For Goal Achievement: 05/12/23 Potential to Achieve Goals: Good  OT Frequency: Min 1X/week    Co-evaluation PT/OT/SLP Co-Evaluation/Treatment: Yes Reason for Co-Treatment: For patient/therapist safety;To address functional/ADL transfers   OT goals addressed during  session: ADL's and self-care      AM-PAC OT "6 Clicks" Daily Activity     Outcome Measure Help from another person eating meals?: A Little Help from another person taking care of personal grooming?: A Little Help from another person toileting, which includes using toliet, bedpan, or urinal?: Total Help from another person bathing (including washing, rinsing, drying)?: A  Lot Help from another person to put on and taking off regular upper body clothing?: Total Help from another person to put on and taking off regular lower body clothing?: Total 6 Click Score: 11   End of Session Equipment Utilized During Treatment: Gait belt;Other (comment) (sling L UE, brace to L LE) Nurse Communication: Mobility status  Activity Tolerance: Patient tolerated treatment well Patient left: in chair;with call bell/phone within reach;with chair alarm set  OT Visit Diagnosis: Other abnormalities of gait and mobility (R26.89);Muscle weakness (generalized) (M62.81);History of falling (Z91.81);Pain Pain - Right/Left: Left Pain - part of body: Shoulder;Arm;Knee;Leg (bil UEs, L LE)                Time: 7846-9629 OT Time Calculation (min): 49 min Charges:  OT General Charges $OT Visit: 1 Visit OT Evaluation $OT Eval Moderate Complexity: 1 Mod OT Treatments $Self Care/Home Management : 8-22 mins  Barry Brunner, OT Acute Rehabilitation Services Office [phone]   Chancy Milroy 04/28/2023, 11:28 AM

## 2023-04-28 NOTE — Progress Notes (Signed)
[facility]                                                                              Morgan Potter, is a 72 y.o. female, DOB - Jun 25, 1950, KGM:[id number] Admit date - 04/25/2023    Outpatient Primary MD for the patient is Medicine, [facility]  LOS - 3  days  Chief Complaint  Patient presents with   Fall       Brief summary   Patient is a 72 year old female with hypertension, hyperlipidemia, anxiety, GERD, Jehovah's Witness presented after mechanical fall.  Patient was rushing out of her gait with coffee and hand when she tripped over a pot and fell on her face.  No syncopal episode but was unable to get up and remained on the ground for approximately 15 minutes before she was able to notify her family.  Subsequently patient reported having severe pain in both arms, right worse than the left and left knee.  Of note, patient is a Jehovah's Witness, declines blood products but is okay for expanders and iron infusions if needed.  X-rays revealed acute fracture involving the distal diaphysis of the right humerus, communicated mildly displaced acute fracture of the left humeral head, and nondisplaced transverse fracture of the left patella.  CT imaging of the head and cervical spine did not note any acute intercranial or cervical abnormality.   Patient was also noted to have sustained a laceration which was repaired by the ED provider.    Assessment & Plan    Principal Problem: Right humeral shaft fracture Left humeral neck fracture -Orthopedics consulted -Underwent open reduction internal fixation of the right humerus on 04/27/2023 -For left humeral fracture, per orthopedics recommended nonoperative management as high risk of failure due to bone quality -Continue pain control, bowel regimen and DVT prophylaxis -PT OT evaluation, recommended CIR, consult placed -H&H stable.   -Vitamin D level 40, placed on vitamin D 1000 mcg daily  Active  problems Left patella nondisplaced fracture -Orthopedics following, recommended nonoperative management, playmaker brace 0 to 60 degrees to be worn when out of bed -Pain control, CIR    Eyebrow laceration, right -Eyebrow laceration was repaired by the ED provider, suture removal was recommended in 7 days  Leukocytosis -Resolved, likely reactive    Normocytic anemia -H&H stable, will follow CBC   Essential hypertension -BP stable, continue Cozaar, HCTZ  -IV hydralazine as needed with parameters    Anxiety and depression -Continue Wellbutrin XL   Hyperlipidemia -Continue pravastatin   Jehovah witness Patient declines blood products, but reports being okay with expanders and iron infusions if needed.  Obesity Estimated body mass index is 33.23 kg/m as calculated from the following:   Height as of this encounter: 5\' 9"  (1.753 m).   Weight as of this encounter: 102.1 kg.  Code Status: Full code DVT Prophylaxis:  SCDs Start: 04/27/23 1539 enoxaparin (LOVENOX) injection 40 mg Start: 04/25/23 1500   Level of Care: Level of care: Telemetry Medical Family Communication: Updated patient Disposition Plan:      Remains inpatient appropriate: CIR   Procedures:  04/27/2023 open reduction internal fixation of right  humeral shaft fracture   Consultants:   Orthopedics  Antimicrobials:   Anti-infectives (From admission, onward)    Start     Dose/Rate Route Frequency Ordered Stop   04/27/23 2000  ceFAZolin (ANCEF) IVPB 2g/100 mL premix        2 g 200 mL/hr over 30 Minutes Intravenous Every 8 hours 04/27/23 1539 04/28/23 1323   04/27/23 1418  vancomycin (VANCOCIN) powder  Status:  Discontinued          As needed 04/27/23 1418 04/27/23 1428          Medications  buPROPion  300 mg Oral Daily   diclofenac  75 mg Oral BID   docusate sodium  100 mg Oral BID   enoxaparin (LOVENOX) injection  40 mg Subcutaneous Q24H   escitalopram  20 mg Oral Daily   fentaNYL (SUBLIMAZE)  injection  50 mcg Intravenous Once   ferrous sulfate  325 mg Oral Q breakfast   hydrochlorothiazide  12.5 mg Oral Daily   losartan  50 mg Oral Daily   pantoprazole  40 mg Oral Daily   pravastatin  20 mg Oral q1800      Subjective:   Morgan Potter was seen and examined today AM.  No acute complaints, very pleasant, in good spirits.  No acute chest pain, shortness of breath, dizziness, abdominal pain, nausea or vomiting. Objective:   Vitals:   04/28/23 0240 04/28/23 0506 04/28/23 0507 04/28/23 0928  BP: 127/64 (!) 140/72  (!) 144/94  Pulse: 96 82 84 90  Resp: 18 18 18 18   Temp:  97.8 F (36.6 C) 97.8 F (36.6 C) 99.1 F (37.3 C)  TempSrc:  Oral  Oral  SpO2: 96% 98% 96%   Weight:      Height:        Intake/Output Summary (Last 24 hours) at 04/28/2023 1359 Last data filed at 04/27/2023 1403 Gross per 24 hour  Intake 2294.94 ml  Output 25 ml  Net 2269.94 ml     Wt Readings from Last 3 Encounters:  04/27/23 102.1 kg  06/24/19 103.9 kg  06/10/19 103.9 kg   Physical Exam General: Alert and oriented x 3, NAD, pleasant Cardiovascular: S1 S2 clear, RRR.  Respiratory: CTAB, no wheezing, rales or rhonchi Gastrointestinal: Soft, nontender, nondistended, NBS Ext: no pedal edema bilaterally LLE in brace Psych: Normal affect    Data Reviewed:  I have personally reviewed following labs    CBC Lab Results  Component Value Date   WBC 10.1 04/28/2023   RBC 3.36 (L) 04/28/2023   HGB 10.1 (L) 04/28/2023   HCT 31.4 (L) 04/28/2023   MCV 93.5 04/28/2023   MCH 30.1 04/28/2023   PLT 232 04/28/2023   MCHC 32.2 04/28/2023   RDW 15.5 04/28/2023   LYMPHSABS 1.6 04/25/2023   MONOABS 0.8 04/25/2023   EOSABS 0.1 04/25/2023   BASOSABS 0.1 04/25/2023     Last metabolic panel Lab Results  Component Value Date   NA 136 04/28/2023   K 3.8 04/28/2023   CL 104 04/28/2023   CO2 24 04/28/2023   BUN 18 04/28/2023   CREATININE 1.01 (H) 04/28/2023   GLUCOSE 111 (H) 04/28/2023    GFRNONAA 58 (L) 04/28/2023   CALCIUM 8.7 (L) 04/28/2023   ANIONGAP 8 04/28/2023    CBG (last 3)  No results for input(s): "GLUCAP" in the last 72 hours.    Coagulation Profile: Recent Labs  Lab 04/25/23 1245  INR 1.0     Radiology Studies: I have  personally reviewed the imaging studies  DG Humerus Right  Result Date: 04/28/2023 CLINICAL DATA:  [id number] Humerus fracture [id number] EXAM: RIGHT HUMERUS - 2+ VIEW COMPARISON:  the previous day's study FINDINGS: Plate and screw fixation of the distal humeral shaft fracture, fragments in near anatomic alignment. Advanced degenerative changes of the glenohumeral articulation as before. IMPRESSION: ORIF distal humeral shaft fracture. Electronically Signed   By: Corlis Leak M.D.   On: 04/28/2023 13:09   DG Humerus Right  Result Date: 04/27/2023 CLINICAL DATA:  ORIF of the right humeral fracture. EXAM: RIGHT HUMERUS - 2+ VIEW COMPARISON:  Radiograph dated 04/25/2023 FINDINGS: Three intraoperative fluoroscopic spot images provided. The total fluoroscopic time is 45.5 seconds with cumulative air kerma of 1.68 mGy. Fixation plate and screws noted. IMPRESSION: Intraoperative fluoroscopic spot images. Electronically Signed   By: Elgie Collard M.D.   On: 04/27/2023 18:04   DG C-Arm 1-60 Min-No Report  Result Date: 04/27/2023 Fluoroscopy was utilized by the requesting physician.  No radiographic interpretation.   DG C-Arm 1-60 Min-No Report  Result Date: 04/27/2023 Fluoroscopy was utilized by the requesting physician.  No radiographic interpretation.   CT SHOULDER LEFT WO CONTRAST  Result Date: 04/27/2023 CLINICAL DATA:  Status post fall.  Left humeral neck fracture. EXAM: CT OF THE UPPER LEFT EXTREMITY WITHOUT CONTRAST TECHNIQUE: Multidetector CT imaging of the left shoulder was performed according to the standard protocol. RADIATION DOSE REDUCTION: This exam was performed according to the departmental dose-optimization program which includes  automated exposure control, adjustment of the mA and/or kV according to patient size and/or use of iterative reconstruction technique. COMPARISON:  Left shoulder radiographs 04/25/2023 FINDINGS: Bones/Joint/Cartilage Acute fracture of the left humeral neck is mildly comminuted and mildly displaced. There is a 3.4 cm fragment involving the greater tuberosity which is mildly displaced posterolaterally and superiorly. Fracture extends into the lesser tuberosity. There is no involvement of the humeral head articular surface. Mild underlying glenohumeral degenerative changes are present with a small shoulder joint effusion. There are mild acromioclavicular degenerative changes. The left scapula and clavicle appear intact. Ligaments Suboptimally assessed by CT. Muscles and Tendons No evidence of focal rotator cuff muscular atrophy or hematoma. There is some edema anteriorly in the deltoid muscle. No gross rotator cuff impingement. Soft tissues No evidence of periarticular hematoma, foreign body or soft tissue emphysema. There is moderate subcutaneous edema surrounding the shoulder, greatest anteriorly and superiorly. IMPRESSION: 1. Acute mildly comminuted and mildly displaced fracture of the left humeral neck as described. No involvement of the humeral head articular surface. 2. No evidence of focal periarticular hematoma or soft tissue emphysema. 3. Mild underlying glenohumeral and acromioclavicular degenerative changes. 4. No significant focal rotator cuff muscular atrophy. Electronically Signed   By: Carey Bullocks M.D.   On: 04/27/2023 13:47       Meshilem Machuca M.D. [facility] 04/28/2023, 1:59 PM  Available via Epic secure chat 7am-7pm After 7 pm, please refer to night coverage provider listed on amion.

## 2023-04-28 NOTE — Progress Notes (Addendum)
Orthopaedic Trauma Progress Note  SUBJECTIVE: Doing okay this morning.  Pain manageable at rest.  The left shoulder feeling better now it is in a sling.  Nerve block started to wear off from the right upper extremity.  No pain in the left knee has not been up out of bed yet since surgery. No chest pain. No SOB. No nausea/vomiting. No other complaints.  Patient is left-hand dominant, has been using her hand some to scroll on her phone.  OBJECTIVE:  Vitals:   04/28/23 0507 04/28/23 0928  BP:  (!) 144/94  Pulse: 84 90  Resp: 18 18  Temp: 97.8 F (36.6 C) 99.1 F (37.3 C)  SpO2: 96%     General: Sitting up in bed, no acute distress Respiratory: No increased work of breathing.  Left upper extremity: Sling in place.  Notable bruising throughout the upper arm and elbow.  No significant tenderness with palpation about the shoulder.  Did not attempt any shoulder motion.  Wrist flexion and extension is intact.  Able to wiggle fingers.  Able make a full fist.  Dorsal sensation throughout all aspects of the hand.  Fingers warm and well-perfused Right upper extremity: Dressing clean, dry, intact.  Tenderness throughout the upper arm as expected.  Tolerates gentle elbow motion.  Did not attempt any shoulder motion.  Wrist flexion/extension is intact.  Endorses sensation to the axillary nerve distribution.  Motor and sensory function intact median ulnar and radial nerve distributions.  Fingers warm well-perfused. Left lower extremity: Hinged knee brace in place.  Minimal swelling about the knee.  No calf tenderness.  Ankle dorsiflexion/plantarflexion intact.  Wiggles toes.  Foot warm and well-perfused  IMAGING: Stable post op imaging.   LABS:  Results for orders placed or performed during the hospital encounter of 04/25/23 (from the past 24 hour(s))  CBC     Status: Abnormal   Collection Time: 04/28/23  5:37 AM  Result Value Ref Range   WBC 10.1 4.0 - 10.5 K/uL   RBC 3.36 (L) 3.87 - 5.11 MIL/uL    Hemoglobin 10.1 (L) 12.0 - 15.0 g/dL   HCT 30.8 (L) 65.7 - 84.6 %   MCV 93.5 80.0 - 100.0 fL   MCH 30.1 26.0 - 34.0 pg   MCHC 32.2 30.0 - 36.0 g/dL   RDW 96.2 95.2 - 84.1 %   Platelets 232 150 - 400 K/uL   nRBC 0.0 0.0 - 0.2 %  Basic metabolic panel     Status: Abnormal   Collection Time: 04/28/23  5:37 AM  Result Value Ref Range   Sodium 136 135 - 145 mmol/L   Potassium 3.8 3.5 - 5.1 mmol/L   Chloride 104 98 - 111 mmol/L   CO2 24 22 - 32 mmol/L   Glucose, Bld 111 (H) 70 - 99 mg/dL   BUN 18 8 - 23 mg/dL   Creatinine, Ser 3.24 (H) 0.44 - 1.00 mg/dL   Calcium 8.7 (L) 8.9 - 10.3 mg/dL   GFR, Estimated 58 (L) >60 mL/min   Anion gap 8 5 - 15    ASSESSMENT: Morgan Potter is a 72 y.o. female, 1 Day Post-Op  S/p OPEN REDUCTION INTERNAL FIXATION RIGHT HUMERAL SHAFT FRACTURE Nonoperative management left proximal humerus fracture Nonoperative management left patella fracture  CV/Blood loss: Acute blood loss anemia, Hgb 10.1 this morning.  Fairly stable from preop.Marland Kitchen Hemodynamically stable  PLAN: Weightbearing: NWB RUE and LUE.  WBAT LLE in hinged brace ROM:  RUE - unrestricted ROM LUE -  maintain sling.  Avoid passive or active shoulder motion for the first 2 weeks. OK for unrestricted elbow/wrist motion.  LLE - maintain hinged brace locked in extension Incisional and dressing care: Reinforce dressings as needed  Showering: Okay to begin getting incisions wet to the right upper extremity starting 04/30/2023 Orthopedic device(s): Hinged brace LLE.  Sling LUE Pain management:  1. Tylenol 650 mg q 6 hours scheduled 2. Robaxin 500 mg q 6 hours PRN 3. Tramadol 50-100 mg q 5 hours PRN 4. Fentanyl 25 mcg q 2 hours PRN 5. Advil 400 mg q 6 hours PRN VTE prophylaxis: Lovenox, SCDs ID:  Ancef 2gm post op Foley/Lines:  No foley, KVO IVFs Impediments to Fracture Healing: Polytrauma.  Vitamin D level pending, will start supplementation as indicated Dispo: PT/OT evaluation today, dispo  pending.  Will plan to remove dressing RUE tomorrow 04/29/2023  D/C recommendations: -Tramadol and Robaxin for pain control -Aspirin 325 mg daily x 30 days for DVT prophylaxis -Possible need for Vit D supplementation  Follow - up plan: 2 weeks after discharge for wound check and repeat x-rays   Contact information:  Truitt Merle MD, Thyra Breed PA-C. After hours and holidays please check [email or web address] for group call information for [facility]   Thompson Caul, PA-C [phone] (office) [email or web address]

## 2023-04-28 NOTE — Evaluation (Signed)
Physical Therapy Evaluation Patient Details Name: Morgan Potter MRN: [id number] DOB: 1951/04/24 Today's Date: 04/28/2023  History of Present Illness  Pt is a 72 y/o female presenting after fall.  Xrays with R UE humeral shaft fx, L UE humeral neck fx and L knee transverse patella fx.  S/P ORIF R humerus 12/9. Non op L UE in sling and L LE in knee brace.  PMH includes: SOB.  Clinical Impression  PTA, pt lives with her family and is independent. Pt presents with decreased functional use of BUE's, limitations due to weightbearing precautions, left knee pain, weakness, and impaired standing balance. Pt requiring two person mod-max assist for transition to standing from a variety of surfaces and able to take pivotal steps from bed to chair to bedside commode. Patient will benefit from intensive inpatient follow up therapy, >3 hours/day in order to address deficits, maximize functional mobility and decrease caregiver burden.         If plan is discharge home, recommend the following: Two people to help with walking and/or transfers;A lot of help with bathing/dressing/bathroom   Can travel by private vehicle        Equipment Recommendations Wheelchair (measurements PT);Wheelchair cushion (measurements PT);BSC/3in1  Recommendations for Other Services       Functional Status Assessment Patient has had a recent decline in their functional status and demonstrates the ability to make significant improvements in function in a reasonable and predictable amount of time.     Precautions / Restrictions Precautions Precautions: Fall;Shoulder Type of Shoulder Precautions: L shoulder no ROM, ok for elbow/wrist/hand; R UE unrestricted ROM Shoulder Interventions: Shoulder sling/immobilizer;At all times;Off for dressing/bathing/exercises (L UE) Precaution Booklet Issued: No Required Braces or Orthoses: Sling;Other Brace Other Brace: knee brace L LE Restrictions Weight Bearing Restrictions: Yes RUE  Weight Bearing: Non weight bearing LUE Weight Bearing: Non weight bearing LLE Weight Bearing: Weight bearing as tolerated Other Position/Activity Restrictions: L UE sling and no shoulder ROM, R UE ROM unrestricted; playmakers brace 0-60 L knee      Mobility  Bed Mobility Overal bed mobility: Needs Assistance Bed Mobility: Supine to Sit     Supine to sit: Min assist     General bed mobility comments: HOB elevated, cueing for technique    Transfers Overall transfer level: Needs assistance Equipment used: None Transfers: Sit to/from Stand, Bed to chair/wheelchair/BSC Sit to Stand: Mod assist, Max assist, +2 physical assistance   Step pivot transfers: Min assist, +2 physical assistance       General transfer comment: cueing for technique, kicking L LE out due to discomfort; mod-max assist +2 to power up from EOB, recliner and BSC (easier from slightly elevated EOB) with min assist +2 to step to surface/    Ambulation/Gait                  Stairs            Wheelchair Mobility     Tilt Bed    Modified Rankin (Stroke Patients Only)       Balance Overall balance assessment: Needs assistance Sitting-balance support: No upper extremity supported, Feet supported Sitting balance-Leahy Scale: Fair     Standing balance support: No upper extremity supported, During functional activity Standing balance-Leahy Scale: Poor Standing balance comment: relies on external support                             Pertinent Vitals/Pain Pain Assessment Pain Assessment:  Faces Faces Pain Scale: Hurts little more Pain Location: L knee, B UEs Pain Descriptors / Indicators: Discomfort, Grimacing, Guarding Pain Intervention(s): Limited activity within patient's tolerance, Monitored during session, Premedicated before session    Home Living Family/patient expects to be discharged to:: Private residence Living Arrangements: Children;Other (Comment) (4 great  grandchildren 38 y/o-15 y/o) Available Help at Discharge: Family Type of Home: House (mobile home in front and built onto in the back) Home Access: Stairs to enter Entrance Stairs-Rails: None Secretary/administrator of Steps: 2   Home Layout: One level Home Equipment: Environmental education officer Comments: possilby has wc    Prior Function Prior Level of Function : Independent/Modified Independent;Driving                     Extremity/Trunk Assessment   Upper Extremity Assessment Upper Extremity Assessment: Defer to OT evaluation    Lower Extremity Assessment Lower Extremity Assessment: LLE deficits/detail LLE Deficits / Details: Patella fx with Playmaker brace donned; set 0-60 deg.    Cervical / Trunk Assessment Cervical / Trunk Assessment: Normal  Communication   Communication Communication: No apparent difficulties  Cognition Arousal: Alert Behavior During Therapy: Anxious Overall Cognitive Status: Within Functional Limits for tasks assessed                                          General Comments      Exercises     Assessment/Plan    PT Assessment Patient needs continued PT services  PT Problem List Decreased strength;Decreased activity tolerance;Decreased balance;Decreased mobility;Impaired sensation;Pain;Decreased range of motion       PT Treatment Interventions Gait training;DME instruction;Therapeutic activities;Functional mobility training;Therapeutic exercise;Balance training;Patient/family education    PT Goals (Current goals can be found in the Care Plan section)  Acute Rehab PT Goals Patient Stated Goal: back to independence PT Goal Formulation: With patient Time For Goal Achievement: 05/12/23 Potential to Achieve Goals: Good    Frequency Min 1X/week     Co-evaluation PT/OT/SLP Co-Evaluation/Treatment: Yes Reason for Co-Treatment: For patient/therapist safety;To address functional/ADL transfers PT goals addressed during  session: Mobility/safety with mobility OT goals addressed during session: ADL's and self-care       AM-PAC PT "6 Clicks" Mobility  Outcome Measure Help needed turning from your back to your side while in a flat bed without using bedrails?: A Little Help needed moving from lying on your back to sitting on the side of a flat bed without using bedrails?: A Little Help needed moving to and from a bed to a chair (including a wheelchair)?: Total Help needed standing up from a chair using your arms (e.g., wheelchair or bedside chair)?: Total Help needed to walk in hospital room?: Total Help needed climbing 3-5 steps with a railing? : Total 6 Click Score: 10    End of Session Equipment Utilized During Treatment: Gait belt;Other (comment) (L knee brace, LUE sling) Activity Tolerance: Patient tolerated treatment well Patient left: in chair;with call bell/phone within reach;with chair alarm set Nurse Communication: Mobility status PT Visit Diagnosis: Pain;Difficulty in walking, not elsewhere classified (R26.2);Other abnormalities of gait and mobility (R26.89) Pain - Right/Left: Left Pain - part of body: Knee;Shoulder    Time: 0940-1030 PT Time Calculation (min) (ACUTE ONLY): 50 min   Charges:   PT Evaluation $PT Eval Moderate Complexity: 1 Mod   PT General Charges $$ ACUTE PT VISIT: 1 Visit  Lillia Pauls, PT, DPT Acute Rehabilitation Services Office [phone]   Norval Morton 04/28/2023, 1:25 PM

## 2023-04-29 ENCOUNTER — Encounter (HOSPITAL_COMMUNITY)
Admission: EM | Disposition: A | Discharge status: Rehabilitation Facility | Payer: Self-pay | Source: Home / Self Care | Attending: Internal Medicine

## 2023-04-29 DIAGNOSIS — S42351A Displaced comminuted fracture of shaft of humerus, right arm, initial encounter for closed fracture: Secondary | ICD-10-CM | POA: Diagnosis not present

## 2023-04-29 DIAGNOSIS — S82002A Unspecified fracture of left patella, initial encounter for closed fracture: Secondary | ICD-10-CM | POA: Diagnosis not present

## 2023-04-29 DIAGNOSIS — S42292A Other displaced fracture of upper end of left humerus, initial encounter for closed fracture: Secondary | ICD-10-CM | POA: Diagnosis not present

## 2023-04-29 DIAGNOSIS — D72829 Elevated white blood cell count, unspecified: Secondary | ICD-10-CM | POA: Diagnosis not present

## 2023-04-29 DIAGNOSIS — W19XXXA Unspecified fall, initial encounter: Secondary | ICD-10-CM | POA: Diagnosis not present

## 2023-04-29 LAB — CBC
HCT: 30.2 % — ABNORMAL LOW (ref 36.0–46.0)
Hemoglobin: 9.8 g/dL — ABNORMAL LOW (ref 12.0–15.0)
MCH: 30 pg (ref 26.0–34.0)
MCHC: 32.5 g/dL (ref 30.0–36.0)
MCV: 92.4 fL (ref 80.0–100.0)
Platelets: 233 10*3/uL (ref 150–400)
RBC: 3.27 MIL/uL — ABNORMAL LOW (ref 3.87–5.11)
RDW: 15.3 % (ref 11.5–15.5)
WBC: 8.7 10*3/uL (ref 4.0–10.5)
nRBC: 0 % (ref 0.0–0.2)

## 2023-04-29 LAB — BASIC METABOLIC PANEL
Anion gap: 8 (ref 5–15)
BUN: 21 mg/dL (ref 8–23)
CO2: 22 mmol/L (ref 22–32)
Calcium: 9 mg/dL (ref 8.9–10.3)
Chloride: 103 mmol/L (ref 98–111)
Creatinine, Ser: 0.73 mg/dL (ref 0.44–1.00)
GFR, Estimated: >60 mL/min (ref >60)
Glucose, Bld: 110 mg/dL — ABNORMAL HIGH (ref 70–99)
Potassium: 3.7 mmol/L (ref 3.5–5.1)
Sodium: 133 mmol/L — ABNORMAL LOW (ref 135–145)

## 2023-04-29 SURGERY — OPEN REDUCTION INTERNAL FIXATION (ORIF) PROXIMAL HUMERUS FRACTURE
Anesthesia: Choice | Laterality: Right

## 2023-04-29 NOTE — Progress Notes (Signed)
Occupational Therapy Treatment Patient Details Name: Morgan Potter MRN: [id number] DOB: 10/24/1950 Today's Date: 04/29/2023   History of present illness Pt is a 72 y/o female presenting after fall.  Xrays with R UE humeral shaft fx, L UE humeral neck fx and L knee transverse patella fx.  S/P ORIF R humerus 12/9. Non op L UE in sling and L LE in knee brace.  PMH includes: SOB.   OT comments  Patient supine in bed and agreeable to OT session.  Discussed self feeding, pt reports using L hand to self feed with some difficulty (only moving her elbow) and encouraged improved position (sitting OOB) for meals, ordering "sticky" foods to avoid spillage, and use R hand as able (due to unrestricted ROM).  Pt able to reach and grab styrofoam cup from table with increased time, encouraged only having cup 1/2 full to optimize independence.  Reviewed restrictions for Ues- worked on exercises as below.  Pt reports pain in L shoulder at rest but does not increase with elbow,wrist; R UE with improved ROM and no pain.  AAROM shoulder to 60-70*, abduction to 45*, and WFL IR/ER; elbow, wrist and hand WFL.  Limited mostly by coordination of R wrist, noted wrist flexion to attempted functional use of her hand.  MD aware, and encouraged pt to work on wrist extension for strengthening as well as lap slides for shoulder ROM/strengthening.  Will follow acutely.       If plan is discharge home, recommend the following:  Two people to help with walking and/or transfers;Two people to help with bathing/dressing/bathroom;Assistance with cooking/housework;Help with stairs or ramp for entrance;Assist for transportation   Equipment Recommendations  Other (comment) (defer)    Recommendations for Other Services Rehab consult    Precautions / Restrictions Precautions Precautions: Fall;Shoulder Type of Shoulder Precautions: L shoulder no ROM, ok for elbow/wrist/hand; R UE unrestricted ROM Shoulder Interventions: Shoulder  sling/immobilizer;At all times;Off for dressing/bathing/exercises (L UE) Precaution Booklet Issued: Yes (comment) Required Braces or Orthoses: Sling;Other Brace Other Brace: knee brace L LE Restrictions Weight Bearing Restrictions: Yes RUE Weight Bearing: Non weight bearing LUE Weight Bearing: Non weight bearing LLE Weight Bearing: Weight bearing as tolerated Other Position/Activity Restrictions: L UE sling and no shoulder ROM, R UE ROM unrestricted; playmakers brace 0-60 L knee       Mobility Bed Mobility                    Transfers                         Balance                                           ADL either performed or assessed with clinical judgement   ADL Overall ADL's : Needs assistance/impaired Eating/Feeding: Supervision/ safety;Bed level Eating/Feeding Details (indicate cue type and reason): pt able to self feed with L hand this morning per report; discussed use of 'sticky' foods for ease/decreased spillage and attempts to use R UE (althought L hand dominent).  She is able to reach to table to retrieve cup with R hand, encouraged only filling up styrofoam cup 1/2 way due to weight  General ADL Comments: focused on UE exercises today    Extremity/Trunk Assessment              Vision       Perception     Praxis      Cognition Arousal: Alert Behavior During Therapy: Anxious Overall Cognitive Status: Within Functional Limits for tasks assessed                                          Exercises Shoulder Exercises Shoulder Flexion: Right, AAROM, 10 reps (to 60-70*) Shoulder Extension: AAROM, 10 reps, Right Shoulder ABduction: AAROM, Right, 10 reps (45*) Shoulder External Rotation: AROM, 10 reps, Right Elbow Flexion: AROM, Both, 10 reps Elbow Extension: AROM, Both, 10 reps Wrist Flexion: AROM, Both, 10 reps Wrist Extension: AROM, Both, 10  reps Digit Composite Flexion: AROM, Both, 10 reps Composite Extension: AROM, Both, 10 reps Other Exercises Other Exercises: AROM both; 10 reps supination/pronation    Shoulder Instructions       General Comments      Pertinent Vitals/ Pain       Pain Assessment Pain Assessment: Faces Faces Pain Scale: Hurts little more Pain Location: L UE Pain Descriptors / Indicators: Discomfort, Grimacing, Guarding Pain Intervention(s): Limited activity within patient's tolerance, Monitored during session, Repositioned  Home Living                                          Prior Functioning/Environment              Frequency  Min 1X/week        Progress Toward Goals  OT Goals(current goals can now be found in the care plan section)  Progress towards OT goals: Progressing toward goals  Acute Rehab OT Goals Patient Stated Goal: get to rehab OT Goal Formulation: With patient Time For Goal Achievement: 05/12/23 Potential to Achieve Goals: Good  Plan      Co-evaluation                 AM-PAC OT "6 Clicks" Daily Activity     Outcome Measure   Help from another person eating meals?: A Little Help from another person taking care of personal grooming?: A Little Help from another person toileting, which includes using toliet, bedpan, or urinal?: Total Help from another person bathing (including washing, rinsing, drying)?: A Lot Help from another person to put on and taking off regular upper body clothing?: Total Help from another person to put on and taking off regular lower body clothing?: Total 6 Click Score: 11    End of Session Equipment Utilized During Treatment: Gait belt;Other (comment) (sling, brace to L LE)  OT Visit Diagnosis: Other abnormalities of gait and mobility (R26.89);Muscle weakness (generalized) (M62.81);History of falling (Z91.81);Pain Pain - Right/Left: Left Pain - part of body: Shoulder;Arm;Knee;Leg   Activity Tolerance  Patient tolerated treatment well   Patient Left in bed;with call bell/phone within reach;with bed alarm set;with family/visitor present;Other (comment) (MD present)   Nurse Communication Mobility status        Time: 8413-2440 OT Time Calculation (min): 43 min  Charges: OT General Charges $OT Visit: 1 Visit OT Treatments $Self Care/Home Management : 8-22 mins $Therapeutic Exercise: 23-37 mins  Barry Brunner, OT Acute Rehabilitation Services Office [phone]   [place]  S Rajni Holsworth 04/29/2023, 1:22 PM

## 2023-04-29 NOTE — Progress Notes (Signed)
Inpatient Rehab Admissions Coordinator:    I met with pt. To discuss potential CIR admit. She is interested. Daughter and other family can rotate to provide 24/7 assist. I will send case to insurance and pursue for admit once rehab MD consult is in.   Megan Salon, MS, CCC-SLP Rehab Admissions Coordinator  [phone] (celll) [phone] (office)

## 2023-04-29 NOTE — Progress Notes (Signed)
Physical Therapy Treatment Patient Details Name: Morgan Potter MRN: [id number] DOB: [birth year] Today's Date: 04/29/2023   History of Present Illness Pt is a 72 y/o female presenting after fall.  Xrays with R UE humeral shaft fx, L UE humeral neck fx and L knee transverse patella fx.  S/P ORIF R humerus 12/9. Non op L UE in sling and L LE in knee brace.  PMH includes: SOB.    PT Comments  Pt making excellent progress towards her physical therapy goals and is motivated to participate. Continues to require increased assist with transitions to standing, however, now ambulating 60 ft with min assist and left knee brace donned. Reports minimal L knee pain in weightbearing position. Patient will benefit from intensive inpatient follow up therapy, >3 hours/day in order to address BUE function/ROM/strength, ADL's, mobility.     If plan is discharge home, recommend the following: A lot of help with bathing/dressing/bathroom;A lot of help with walking and/or transfers   Can travel by private vehicle        Equipment Recommendations  Wheelchair (measurements PT);Wheelchair cushion (measurements PT);BSC/3in1    Recommendations for Other Services       Precautions / Restrictions Precautions Precautions: Fall;Shoulder Type of Shoulder Precautions: L shoulder no ROM, ok for elbow/wrist/hand; R UE unrestricted ROM Shoulder Interventions: Shoulder sling/immobilizer;At all times;Off for dressing/bathing/exercises (L UE) Precaution Booklet Issued: No Required Braces or Orthoses: Sling;Other Brace Other Brace: knee brace L LE Restrictions Weight Bearing Restrictions: Yes RUE Weight Bearing: Non weight bearing LUE Weight Bearing: Non weight bearing LLE Weight Bearing: Weight bearing as tolerated Other Position/Activity Restrictions: L UE sling and no shoulder ROM, R UE ROM unrestricted; playmakers brace 0-60 L knee     Mobility  Bed Mobility Overal bed mobility: Needs Assistance Bed Mobility:  Supine to Sit     Supine to sit: Contact guard     General bed mobility comments: HOB elevated, cues for weightbearing precautions    Transfers Overall transfer level: Needs assistance Equipment used: None Transfers: Sit to/from Stand Sit to Stand: Mod assist, +2 physical assistance           General transfer comment: Cues for rocking forward to gain momentum, use of gait belt to assist pt to power up    Ambulation/Gait Ambulation/Gait assistance: Min assist, +2 safety/equipment Gait Distance (Feet): 60 Feet Assistive device: None Gait Pattern/deviations: Step-through pattern, Decreased stance time - left, Antalgic, Decreased weight shift to left Gait velocity: decreased     General Gait Details: MinA for dynamic balance   Stairs             Wheelchair Mobility     Tilt Bed    Modified Rankin (Stroke Patients Only)       Balance Overall balance assessment: Needs assistance Sitting-balance support: No upper extremity supported, Feet supported Sitting balance-Leahy Scale: Fair     Standing balance support: No upper extremity supported, During functional activity Standing balance-Leahy Scale: Poor Standing balance comment: relies on external support                            Cognition Arousal: Alert Behavior During Therapy: Anxious Overall Cognitive Status: Within Functional Limits for tasks assessed                                          Exercises  General Comments        Pertinent Vitals/Pain Pain Assessment Pain Assessment: Faces Faces Pain Scale: Hurts little more Pain Location: L knee, B UEs Pain Descriptors / Indicators: Discomfort, Grimacing, Guarding Pain Intervention(s): Monitored during session    Home Living                          Prior Function            PT Goals (current goals can now be found in the care plan section) Acute Rehab PT Goals Patient Stated Goal: back to  independence PT Goal Formulation: With patient Time For Goal Achievement: 05/12/23 Potential to Achieve Goals: Good Progress towards PT goals: Progressing toward goals    Frequency    Min 1X/week      PT Plan      Co-evaluation              AM-PAC PT "6 Clicks" Mobility   Outcome Measure  Help needed turning from your back to your side while in a flat bed without using bedrails?: A Little Help needed moving from lying on your back to sitting on the side of a flat bed without using bedrails?: A Little Help needed moving to and from a bed to a chair (including a wheelchair)?: A Little Help needed standing up from a chair using your arms (e.g., wheelchair or bedside chair)?: A Lot Help needed to walk in hospital room?: A Little Help needed climbing 3-5 steps with a railing? : Total 6 Click Score: 15    End of Session Equipment Utilized During Treatment: Gait belt;Other (comment) (L knee brace, LUE sling) Activity Tolerance: Patient tolerated treatment well Patient left: in chair;with call bell/phone within reach;with chair alarm set Nurse Communication: Mobility status PT Visit Diagnosis: Pain;Difficulty in walking, not elsewhere classified (R26.2);Other abnormalities of gait and mobility (R26.89) Pain - Right/Left: Left Pain - part of body: Knee;Shoulder     Time: 1201-1226 PT Time Calculation (min) (ACUTE ONLY): 25 min  Charges:    $Therapeutic Activity: 23-37 mins PT General Charges $$ ACUTE PT VISIT: 1 Visit                     Lillia Pauls, PT, DPT Acute Rehabilitation Services Office [phone]    Norval Morton 04/29/2023, 2:45 PM

## 2023-04-29 NOTE — PMR Pre-admission (Signed)
PMR Admission Coordinator Pre-Admission Assessment  Patient: Morgan Potter is an 72 y.o., female MRN: [id number] DOB: 25-Feb-1951 Height: 5\' 9"  (175.3 cm) Weight: 102.1 kg              Insurance Information HMO:     PPO:      PCP:      IPA:      80/20:      OTHER:  PRIMARY: Blue Medicare      Policy#: [id number]      Subscriber: Pt CM Name: Morgan Potter      Phone#: [phone]     Fax#:  [phone] Pre-Cert#: [id number] approved on 04/30/23.  Call for auth # and dates of service      Employer:  Eff Date: 07/18/2022 - 05/19/2023 Deductible: does not have one OOP Max: $3,500 ($348.61 met) CIR: $335/day co-pay for days 1-5, $0 copay for days 6-90 SNF: $0.00 Copayment per day for days 1-20; $203 Copayment per day for days 21-60; $0.00 copayment for days 61-100 Maximum of 100 days/benefit period Outpatient:  $10 copay/visit Home Health:  100% coverage DME: 80% coverage, 20% co-insurance Providers: in network   SECONDARY:      Policy#:       Phone#:   Artist:       Phone#:   The Data processing manager" for patients in Inpatient Rehabilitation Facilities with attached "Privacy Act Statement-Health Care Records" was provided and verbally reviewed with: Patient  Emergency Contact Information Contact Information     Name Relation Home Work Mobile   Morgan Potter Daughter   [phone]      Other Contacts   None on File    Current Medical History  Patient Admitting Diagnosis: Polytrauma  History of Present Illness:  Morgan Potter is a 72 y.o. female with medical history significant of hypertension, hyperlipidemia, anxiety, GERD, and Jehovah witness who presented to the [facility] on 04/25/23 after having a fall. She was rushing out of her gate with coffee in hand when she tripped over a pot and fell on her face. She denies loss of consciousness, but was unable to get up and remained on the ground for approximately 15 minutes before she is  able to notify another family member to get her daughter.  After a fall patient reported having severe pain in both arms right worse than the left as well as her knee.     In the emergency department patient was noted to be afebrile with blood pressures elevated up to 161/93.  Labs significant for WBC 12.5, hemoglobin 11.9, and glucose 122.  X-rays revealed acute fracture involving the distal diaphysis of the right humerus, communicated mildly displaced acute fracture of the left humeral head, and nondisplaced transverse fracture of the left patella.  CT imaging of the head and cervical spine did not note any acute intercranial or cervical abnormality.  Patient was also noted to have sustained a laceration which was repaired by the ED provider.  Right eyebrow laceration was repaired with sutures by the ED physician.  Dr. Roda Shutters of orthopedics was consulted/ She had been given fentanyl IV without issue while in the emergency department. Pt. Underwent ORIF R humerus on 04/27/23. Pt. Was seen by PT/OT/SLP with recommendations for CIR to assist return to PLOF.    Glasgow Coma Scale Score: 15  Patient's medical record fro [facility]   has been reviewed by the rehabilitation admission coordinator and physician.  Past Medical History  Past Medical  History:  Diagnosis Date   Dizziness and giddiness    Dyspnea    Fatigue    Lightheadedness    Palpitations    SOB (shortness of breath)    Weakness     Has the patient had major surgery during 100 days prior to admission? Yes  Family History  family history includes Arrhythmia in her mother; Cancer in her father; Heart attack in her father.   Current Medications   Current Facility-Administered Medications:    acetaminophen (TYLENOL) tablet 1,000 mg, 1,000 mg, Oral, TID, Dahal, Melina Schools, MD   bisacodyl (DULCOLAX) suppository 10 mg, 10 mg, Rectal, Daily PRN, Rai, Ripudeep K, MD   buPROPion (WELLBUTRIN XL) 24 hr tablet 300 mg, 300 mg, Oral,  Daily, West Bali, PA-C, 300 mg at 05/02/23 1610   cholecalciferol (VITAMIN D3) 25 MCG (1000 UNIT) tablet 1,000 Units, 1,000 Units, Oral, Daily, Rai, Ripudeep K, MD, 1,000 Units at 05/02/23 0842   diphenhydrAMINE (BENADRYL) 12.5 MG/5ML elixir 12.5-25 mg, 12.5-25 mg, Oral, Q4H PRN, West Bali, PA-C, 25 mg at 04/29/23 2210   docusate sodium (COLACE) capsule 100 mg, 100 mg, Oral, BID, West Bali, PA-C, 100 mg at 05/01/23 2139   enoxaparin (LOVENOX) injection 40 mg, 40 mg, Subcutaneous, Q24H, McClung, Sarah A, PA-C, 40 mg at 05/01/23 1637   escitalopram (LEXAPRO) tablet 20 mg, 20 mg, Oral, Daily, West Bali, PA-C, 20 mg at 05/02/23 9604   fentaNYL (SUBLIMAZE) injection 50 mcg, 50 mcg, Intravenous, Once, McClung, Sarah A, PA-C   ferrous sulfate tablet 325 mg, 325 mg, Oral, Q breakfast, West Bali, PA-C, 325 mg at 05/02/23 0842   fluticasone (FLONASE) 50 MCG/ACT nasal spray 1 spray, 1 spray, Each Nare, Daily, Dahal, Binaya, MD, 1 spray at 05/02/23 0843   hydrALAZINE (APRESOLINE) injection 10 mg, 10 mg, Intravenous, Q4H PRN, McClung, Sarah A, PA-C   hydrochlorothiazide (HYDRODIURIL) tablet 12.5 mg, 12.5 mg, Oral, Daily, West Bali, PA-C, 12.5 mg at 05/02/23 0842   HYDROcodone-acetaminophen (NORCO) 7.5-325 MG per tablet 1-2 tablet, 1-2 tablet, Oral, Q6H PRN, West Bali, PA-C, 2 tablet at 04/29/23 0950   HYDROcodone-acetaminophen (NORCO/VICODIN) 5-325 MG per tablet 1-2 tablet, 1-2 tablet, Oral, Q6H PRN, West Bali, PA-C, 2 tablet at 04/29/23 1530   HYDROmorphone (DILAUDID) injection 1 mg, 1 mg, Intravenous, Q3H PRN, West Bali, PA-C, 1 mg at 04/29/23 2346   ibuprofen (ADVIL) tablet 400 mg, 400 mg, Oral, Q6H PRN, McClung, Sarah A, PA-C   LORazepam (ATIVAN) tablet 1 mg, 1 mg, Oral, BID PRN, West Bali, PA-C, 1 mg at 05/02/23 0842   losartan (COZAAR) tablet 50 mg, 50 mg, Oral, Daily, West Bali, PA-C, 50 mg at 05/02/23 5409   methocarbamol  (ROBAXIN) tablet 500 mg, 500 mg, Oral, Q6H PRN, 500 mg at 05/01/23 2139 **OR** methocarbamol (ROBAXIN) injection 500 mg, 500 mg, Intravenous, Q6H PRN, McClung, Sarah A, PA-C   metoCLOPramide (REGLAN) tablet 5-10 mg, 5-10 mg, Oral, Q8H PRN **OR** metoCLOPramide (REGLAN) injection 5-10 mg, 5-10 mg, Intravenous, Q8H PRN, McClung, Sarah A, PA-C   montelukast (SINGULAIR) tablet 10 mg, 10 mg, Oral, QHS, Dahal, Binaya, MD, 10 mg at 05/01/23 2139   ondansetron (ZOFRAN) tablet 4 mg, 4 mg, Oral, Q6H PRN **OR** ondansetron (ZOFRAN) injection 4 mg, 4 mg, Intravenous, Q6H PRN, McClung, Sarah A, PA-C   pantoprazole (PROTONIX) EC tablet 40 mg, 40 mg, Oral, Daily, Thyra Breed A, PA-C, 40 mg at 05/02/23 0843   polyethylene glycol (MIRALAX /  GLYCOLAX) packet 17 g, 17 g, Oral, Daily, Rai, Ripudeep K, MD, 17 g at 04/30/23 0914   pravastatin (PRAVACHOL) tablet 20 mg, 20 mg, Oral, q1800, West Bali, PA-C, 20 mg at 05/01/23 1817   senna-docusate (Senokot-S) tablet 2 tablet, 2 tablet, Oral, QHS, Rai, Ripudeep K, MD, 2 tablet at 05/01/23 2140  Patients Current Diet:  Diet Order             Diet general           Diet regular Room service appropriate? Yes; Fluid consistency: Thin  Diet effective now                   Precautions / Restrictions Precautions Precautions: Fall, Shoulder Type of Shoulder Precautions: L shoulder no ROM, ok for elbow/wrist/hand; R UE unrestricted ROM Precaution Booklet Issued: No Other Brace: knee brace L LE Restrictions Weight Bearing Restrictions Per Provider Order: Yes RUE Weight Bearing Per Provider Order: Non weight bearing LUE Weight Bearing Per Provider Order: Non weight bearing LLE Weight Bearing Per Provider Order: Weight bearing as tolerated Other Position/Activity Restrictions: L UE sling and no shoulder ROM, R UE ROM unrestricted; playmakers brace 0-60 L knee   Has the patient had 2 or more falls or a fall with injury in the past year?Yes  Prior Activity  Level Community (5-7x/wk): Pt. active in the community PTA  Prior Functional Level Prior Function Prior Level of Function : Independent/Modified Independent, Driving  Self Care: Did the patient need help bathing, dressing, using the toilet or eating?  Independent  Indoor Mobility: Did the patient need assistance with walking from room to room (with or without device)? Independent  Stairs: Did the patient need assistance with internal or external stairs (with or without device)? Independent  Functional Cognition: Did the patient need help planning regular tasks such as shopping or remembering to take medications? Independent  Patient Information Are you of Hispanic, Latino/a,or Spanish origin?: A. No, not of Hispanic, Latino/a, or Spanish origin What is your race?: A. White Do you need or want an interpreter to communicate with a doctor or health care staff?: 0. No  Patient's Response To:  Health Literacy and Transportation Is the patient able to respond to health literacy and transportation needs?: Yes Health Literacy - How often do you need to have someone help you when you read instructions, pamphlets, or other written material from your doctor or pharmacy?: Never In the past 12 months, has lack of transportation kept you from medical appointments or from getting medications?: No In the past 12 months, has lack of transportation kept you from meetings, work, or from getting things needed for daily living?: No  Home Assistive Devices / Equipment Home Equipment: Shower seat  Prior Device Use: Indicate devices/aids used by the patient prior to current illness, exacerbation or injury? None of the above  Current Functional Level Cognition  Overall Cognitive Status: Within Functional Limits for tasks assessed Orientation Level: Oriented X4    Extremity Assessment (includes Sensation/Coordination)  Upper Extremity Assessment: Defer to OT evaluation RUE Deficits / Details: s/p ORIF  to humerus, unresticted ROM but nerve block in place.  PROM limited to elbow and shoulder, AROM supination and hand.  Decreased sensation RUE: Unable to fully assess due to pain RUE Sensation: decreased light touch RUE Coordination: decreased fine motor, decreased gross motor LUE Deficits / Details: in sling, no ROM at shoulder.  not formally assessed due to pain. Elbow ROM to wash face and [facility]  hand movement LUE: Unable to fully assess due to pain, Unable to fully assess due to immobilization LUE Sensation: WNL LUE Coordination: decreased gross motor  Lower Extremity Assessment: LLE deficits/detail LLE Deficits / Details: Patella fx with Playmaker brace donned; set 0-60 deg.    ADLs  Overall ADL's : Needs assistance/impaired Eating/Feeding: Set up, Bed level Eating/Feeding Details (indicate cue type and reason): patient had completed breakfast at bedlevel and patient states she fed self with LUE Grooming: Wash/dry hands, Wash/dry face, Set up Grooming Details (indicate cue type and reason): with RUE Upper Body Dressing : Moderate assistance, Sitting Upper Body Dressing Details (indicate cue type and reason): gown to cover back Lower Body Dressing: Total assistance, +2 for physical assistance, Sit to/from stand Toilet Transfer: Moderate assistance, Ambulation, Regular Toilet, BSC/3in1 Toilet Transfer Details (indicate cue type and reason): mod assist for sit to stand and assistance to lower to [facility] over toilet Toileting- Clothing Manipulation and Hygiene: Total assistance, Sit to/from stand Functional mobility during ADLs: +2 for physical assistance, Maximal assistance, Cueing for safety General ADL Comments: Nursing assisted with toilet hygiene due to patient stating she did not want a man to assist    Mobility  Overal bed mobility: Needs Assistance Bed Mobility: Supine to Sit Supine to sit: Contact guard General bed mobility comments: cues for WB precautions    Transfers  Overall  transfer level: Needs assistance Equipment used: None Transfers: Sit to/from Stand, Bed to chair/wheelchair/BSC Sit to Stand: Mod assist Bed to/from chair/wheelchair/BSC transfer type:: Step pivot Step pivot transfers: Min assist General transfer comment: mod assist for sit to stands from EOB and recliner    Ambulation / Gait / Stairs / Wheelchair Mobility  Ambulation/Gait Ambulation/Gait assistance: Min assist, +2 safety/equipment Gait Distance (Feet): 80 Feet Assistive device: None Gait Pattern/deviations: Step-through pattern, Decreased stance time - left, Antalgic, Decreased weight shift to left General Gait Details: MinA for dynamic balance, antalgic gait pattern Gait velocity: decreased Gait velocity interpretation: <1.31 ft/sec, indicative of household ambulator    Posture / Balance Dynamic Sitting Balance Sitting balance - Comments: EOB Balance Overall balance assessment: Needs assistance Sitting-balance support: No upper extremity supported, Feet supported Sitting balance-Leahy Scale: Fair Sitting balance - Comments: EOB Standing balance support: No upper extremity supported, During functional activity Standing balance-Leahy Scale: Poor Standing balance comment: relies on external support    Special needs/care consideration Skin Abrasion: arm, leg, knee/left; Surgical Incision: arm/right      Previous Home Environment (from acute therapy documentation) Living Arrangements: Children, Other (Comment) (4 great grandchildren 48 y/o-15 y/o) Available Help at Discharge: Family Type of Home: House (mobile home in front and built onto in the back) Home Layout: One level Home Access: Stairs to enter Entrance Stairs-Rails: None Secretary/administrator of Steps: 2 Bathroom Shower/Tub: Health visitor: Handicapped height Bathroom Accessibility: Yes How Accessible: Accessible via walker, Accessible via wheelchair Additional Comments: possilby has wc  Discharge  Living Setting Plans for Discharge Living Setting: Patient's home Type of Home at Discharge: House Discharge Home Layout: One level Discharge Home Access: Stairs to enter Entrance Stairs-Rails: None Entrance Stairs-Number of Steps: 2 Discharge Bathroom Shower/Tub: Walk-in shower Discharge Bathroom Toilet: Handicapped height Discharge Bathroom Accessibility: No  Social/Family/Support Systems Patient Roles: Other (Comment) Contact Information: [phone] Anticipated Caregiver: 24/7 Anticipated Caregiver's Contact Information: Darel Hong (daughter) home most the time. Other family can assist at d/c Caregiver Availability: 24/7 Discharge Plan Discussed with Primary Caregiver: Yes Is Caregiver In Agreement with Plan?: Yes Does Caregiver/Family have Issues  with Lodging/Transportation while Pt is in Rehab?: Yes   Goals Patient/Family Goal for Rehab: PT/OT: Supervision-Min A Expected length of stay: 14-16 days Pt/Family Agrees to Admission and willing to participate: Yes Program Orientation Provided & Reviewed with Pt/Caregiver Including Roles  & Responsibilities: No   Decrease burden of Care through IP rehab admission: not anticipated   Possible need for SNF placement upon discharge:not anticipated   Patient Condition: This patient's medical and functional status has changed since the consult dated: 04/29/23 in which the Rehabilitation Physician determined and documented that the patient's condition is appropriate for intensive rehabilitative care in an inpatient rehabilitation facility. See "History of Present Illness" (above) for medical update. Functional changes are: Pt has progressed to Min A +2-Mod A with mobility and Mod A with basic ADLs. Patient's medical and functional status update has been discussed with the Rehabilitation physician and patient remains appropriate for inpatient rehabilitation. Will admit to inpatient rehab today.  Preadmission Screen Completed By:  Trish Mage, RN, with updates by Wolfgang Phoenix 05/02/2023 9:02 AM ______________________________________________________________________   Discussed status with Dr. Berline Chough on 05/02/23 at 9:02 AM and received approval for admission today.  Admission Coordinator:  Trish Mage, time 9:02 AM/Date 05/02/23

## 2023-04-29 NOTE — Progress Notes (Signed)
Orthopaedic Trauma Progress Note  SUBJECTIVE: Doing okay this morning. Minimal pain right upper extremity. Left shoulder hurting. No pain in the left knee.  No chest pain. No SOB. No nausea/vomiting. No other complaints.  Patient is left-hand dominant, has been using her hand some to scroll on her phone.  OBJECTIVE:  Vitals:   04/29/23 0448 04/29/23 0749  BP: (!) 136/93 (!) 155/86  Pulse: 83 84  Resp: 15 18  Temp: (!) 97.4 F (36.3 C) 98.6 F (37 C)  SpO2: 98% 98%    General: Sitting up in bed, no acute distress Respiratory: No increased work of breathing.  Left upper extremity: Sling in place.  Notable bruising throughout the upper arm and elbow.  No significant tenderness with palpation about the shoulder.  Did not attempt any shoulder motion.  Wrist flexion and extension is intact.  Able to wiggle fingers.  Able make a full fist.  Endorses sensation throughout all aspects of the hand.  Fingers warm and well-perfused Right upper extremity: Dressing clean, dry, intact.  Tenderness throughout the upper arm as expected.  Tolerates gentle elbow motion.  Did not attempt any shoulder motion.  Wrist flexion/extension is intact.  Endorses sensation to the axillary nerve distribution.  Motor and sensory function intact median ulnar and radial nerve distributions.  Fingers warm well-perfused. Left lower extremity: Hinged knee brace in place.  Minimal swelling about the knee.  No calf tenderness.  Ankle dorsiflexion/plantarflexion intact.  Wiggles toes.  Foot warm and well-perfused  IMAGING: Stable post op imaging.   LABS:  Results for orders placed or performed during the hospital encounter of 04/25/23 (from the past 24 hour(s))  CBC     Status: Abnormal   Collection Time: 04/29/23  5:29 AM  Result Value Ref Range   WBC 8.7 4.0 - 10.5 K/uL   RBC 3.27 (L) 3.87 - 5.11 MIL/uL   Hemoglobin 9.8 (L) 12.0 - 15.0 g/dL   HCT 09.8 (L) 11.9 - 14.7 %   MCV 92.4 80.0 - 100.0 fL   MCH 30.0 26.0 - 34.0 pg    MCHC 32.5 30.0 - 36.0 g/dL   RDW 82.9 56.2 - 13.0 %   Platelets 233 150 - 400 K/uL   nRBC 0.0 0.0 - 0.2 %  Basic metabolic panel     Status: Abnormal   Collection Time: 04/29/23  5:29 AM  Result Value Ref Range   Sodium 133 (L) 135 - 145 mmol/L   Potassium 3.7 3.5 - 5.1 mmol/L   Chloride 103 98 - 111 mmol/L   CO2 22 22 - 32 mmol/L   Glucose, Bld 110 (H) 70 - 99 mg/dL   BUN 21 8 - 23 mg/dL   Creatinine, Ser 8.65 0.44 - 1.00 mg/dL   Calcium 9.0 8.9 - 78.4 mg/dL   GFR, Estimated >69 >62 mL/min   Anion gap 8 5 - 15    ASSESSMENT: Morgan Potter is a 72 y.o. female, 2 Days Post-Op  S/p OPEN REDUCTION INTERNAL FIXATION RIGHT HUMERAL SHAFT FRACTURE Nonoperative management left proximal humerus fracture Nonoperative management left patella fracture  CV/Blood loss: Acute blood loss anemia, Hgb 9.8 this morning. Hemodynamically stable  PLAN: Weightbearing: NWB RUE and LUE.  WBAT LLE in hinged brace ROM:  RUE - unrestricted ROM LUE - maintain sling.  Avoid passive or active shoulder motion for the first 2 weeks. OK for unrestricted elbow/wrist motion.  LLE - maintain hinged brace locked in extension Incisional and dressing care: Reinforce dressings as  needed  Showering: Okay to begin getting incisions wet to the right upper extremity starting 04/30/2023 Orthopedic device(s): Hinged brace LLE.  Sling LUE Pain management:  1. Tylenol 650 mg q 6 hours PRN 2. Robaxin 500 mg q 6 hours PRN 3. Hydrocodone 5-325 OR 7.5-325 mg q 6 hours PRN 4. Dilaudid 1 q 3 hours PRN 5. Advil 400 mg q 6 hours PRN VTE prophylaxis: Lovenox, SCDs ID:  Ancef 2gm post op completed Foley/Lines:  No foley, KVO IVFs Impediments to Fracture Healing: Polytrauma.  Vitamin D level 40, no additional supplementation needed  Dispo: PT/OT evaluation ongoing, currently recommending CIR.  Patient okay for discharge from ortho standpoint once cleared by medicine team and therapies  D/C recommendations: -Hydrocodone and  Robaxin for pain control -Aspirin 325 mg daily x 30 days for DVT prophylaxis -No additional need for Vit D supplementation  Follow - up plan: 2 weeks after discharge for wound check and repeat x-rays   Contact information:  Truitt Merle MD, Thyra Breed PA-C. After hours and holidays please check [email or web address] for group call information for [facility]   Thompson Caul, PA-C [phone] (office) [email or web address]

## 2023-04-29 NOTE — Consult Note (Signed)
Physical Medicine and Rehabilitation Consult Reason for Consult: Polytrauma Referring Physician: Seabron Spates, MD   HPI: Morgan Potter is a 72 y.o. female who is 2 days post op from open reduction and internal fixation of right humeral shaft fracture. She also has left proximal humerus fracture and left patellar fracture. She has been wearing hinged knee brace and is NWB is b/l upper extremities. Pain is currently being managed with Dilaudid, hydrocodone, Robaxin, Advil, and Tylenol. Physical Medicine & Rehabilitation was consulted to assess candidacy for CIR.     ROS +postoperative pain Past Medical History:  Diagnosis Date   Dizziness and giddiness    Dyspnea    Fatigue    Lightheadedness    Palpitations    SOB (shortness of breath)    Weakness    Past Surgical History:  Procedure Laterality Date   APPENDECTOMY  1976   CESAREAN SECTION  1981   CESAREAN SECTION  1969   cholecystectomy  1976   ORIF HUMERUS FRACTURE Right 04/27/2023   Procedure: OPEN REDUCTION INTERNAL FIXATION (ORIF) HUMERAL SHAFT FRACTURE;  Surgeon: Roby Lofts, MD;  Location: [facility] OR;  Service: Orthopedics;  Laterality: Right;   Family History  Problem Relation Age of Onset   Arrhythmia Mother    Heart attack Father    Cancer Father    Social History:  reports that she has never smoked. She has never used smokeless tobacco. No history on file for alcohol use and drug use. Allergies:  Allergies  Allergen Reactions   Codeine    Morphine And Codeine    Sulfa Antibiotics    Medications Prior to Admission  Medication Sig Dispense Refill   buPROPion (WELLBUTRIN XL) 300 MG 24 hr tablet Take 300 mg by mouth daily.     Cholecalciferol 125 MCG (5000 UT) capsule Take 5,000 Units by mouth daily.     diclofenac (VOLTAREN) 75 MG EC tablet Take 75 mg by mouth 2 (two) times daily.     escitalopram (LEXAPRO) 20 MG tablet Take 20 mg by mouth daily.     esomeprazole (NEXIUM) 40 MG capsule Take 40 mg by mouth  daily.     ferrous sulfate 325 (65 FE) MG tablet Take 325 mg by mouth daily with breakfast.     LORazepam (ATIVAN) 1 MG tablet Take 1 mg by mouth 2 (two) times daily.     losartan-hydrochlorothiazide (HYZAAR) 50-12.5 MG tablet TAKE 1 TABLET EVERY DAY     montelukast (SINGULAIR) 10 MG tablet Take 10 mg by mouth at bedtime.     pravastatin (PRAVACHOL) 20 MG tablet Take 20 mg by mouth at bedtime.     thiamine (VITAMIN B-1) 100 MG tablet Take 100 mg by mouth daily.      Home: Home Living Family/patient expects to be discharged to:: Private residence Living Arrangements: Children, Other (Comment) (4 great grandchildren 13 y/o-15 y/o) Available Help at Discharge: Family Type of Home: House (mobile home in front and built onto in the back) Home Access: Stairs to enter Secretary/administrator of Steps: 2 Entrance Stairs-Rails: None Home Layout: One level Bathroom Shower/Tub: Health visitor: Handicapped height Home Equipment: Information systems manager Additional Comments: possilby has wc  Functional History: Prior Function Prior Level of Function : Independent/Modified Independent, Driving Functional Status:  Mobility: Bed Mobility Overal bed mobility: Needs Assistance Bed Mobility: Supine to Sit Supine to sit: Min assist General bed mobility comments: HOB elevated, cueing for technique Transfers Overall transfer level: Needs assistance  Equipment used: None Transfers: Sit to/from Stand, Bed to chair/wheelchair/BSC Sit to Stand: Mod assist, Max assist, +2 physical assistance Bed to/from chair/wheelchair/BSC transfer type:: Step pivot Step pivot transfers: Min assist, +2 physical assistance General transfer comment: cueing for technique, kicking L LE out due to discomfort; mod-max assist +2 to power up from EOB, recliner and BSC (easier from slightly elevated EOB) with min assist +2 to step to surface/      ADL: ADL Overall ADL's : Needs assistance/impaired Grooming: Sitting,  Minimal assistance Upper Body Dressing : Total assistance, Sitting Lower Body Dressing: Total assistance, +2 for physical assistance, Sit to/from stand Toilet Transfer: Maximal assistance, +2 for physical assistance, Stand-pivot Toileting- Clothing Manipulation and Hygiene: Total assistance, +2 for physical assistance, Sit to/from stand Functional mobility during ADLs: +2 for physical assistance, Maximal assistance, Cueing for safety  Cognition: Cognition Overall Cognitive Status: Within Functional Limits for tasks assessed Orientation Level: Oriented X4 Cognition Arousal: Alert Behavior During Therapy: Anxious Overall Cognitive Status: Within Functional Limits for tasks assessed  Blood pressure (!) 155/86, pulse 84, temperature 98.6 F (37 C), temperature source Oral, resp. rate 18, height 5\' 9"  (1.753 m), weight 102.1 kg, SpO2 98%. Physical Exam Gen: no distress, normal appearing HEENT: oral mucosa pink and moist, NCAT Cardio: Reg rate Chest: normal effort, normal rate of breathing Abd: soft, non-distended Ext: no edema Psych: pleasant, normal affect Skin: intact Neuro: Alert and oriented Musculoskeletal: Sling in place in LUE, distally neurologically intact, RUE dressing c/d/I, distally neurologically intact, LLE with hinged knee brace, distally neurologically intact  Results for orders placed or performed during the hospital encounter of 04/25/23 (from the past 24 hour(s))  CBC     Status: Abnormal   Collection Time: 04/29/23  5:29 AM  Result Value Ref Range   WBC 8.7 4.0 - 10.5 K/uL   RBC 3.27 (L) 3.87 - 5.11 MIL/uL   Hemoglobin 9.8 (L) 12.0 - 15.0 g/dL   HCT 08.6 (L) 57.8 - 46.9 %   MCV 92.4 80.0 - 100.0 fL   MCH 30.0 26.0 - 34.0 pg   MCHC 32.5 30.0 - 36.0 g/dL   RDW 62.9 52.8 - 41.3 %   Platelets 233 150 - 400 K/uL   nRBC 0.0 0.0 - 0.2 %  Basic metabolic panel     Status: Abnormal   Collection Time: 04/29/23  5:29 AM  Result Value Ref Range   Sodium 133 (L) 135 -  145 mmol/L   Potassium 3.7 3.5 - 5.1 mmol/L   Chloride 103 98 - 111 mmol/L   CO2 22 22 - 32 mmol/L   Glucose, Bld 110 (H) 70 - 99 mg/dL   BUN 21 8 - 23 mg/dL   Creatinine, Ser 2.44 0.44 - 1.00 mg/dL   Calcium 9.0 8.9 - 01.0 mg/dL   GFR, Estimated >27 >25 mL/min   Anion gap 8 5 - 15   DG Humerus Right  Result Date: 04/28/2023 CLINICAL DATA:  [id number] Humerus fracture [id number] EXAM: RIGHT HUMERUS - 2+ VIEW COMPARISON:  the previous day's study FINDINGS: Plate and screw fixation of the distal humeral shaft fracture, fragments in near anatomic alignment. Advanced degenerative changes of the glenohumeral articulation as before. IMPRESSION: ORIF distal humeral shaft fracture. Electronically Signed   By: Corlis Leak M.D.   On: 04/28/2023 13:09   DG Humerus Right  Result Date: 04/27/2023 CLINICAL DATA:  ORIF of the right humeral fracture. EXAM: RIGHT HUMERUS - 2+ VIEW COMPARISON:  Radiograph dated 04/25/2023 FINDINGS: Three  intraoperative fluoroscopic spot images provided. The total fluoroscopic time is 45.5 seconds with cumulative air kerma of 1.68 mGy. Fixation plate and screws noted. IMPRESSION: Intraoperative fluoroscopic spot images. Electronically Signed   By: Elgie Collard M.D.   On: 04/27/2023 18:04   DG C-Arm 1-60 Min-No Report  Result Date: 04/27/2023 Fluoroscopy was utilized by the requesting physician.  No radiographic interpretation.   DG C-Arm 1-60 Min-No Report  Result Date: 04/27/2023 Fluoroscopy was utilized by the requesting physician.  No radiographic interpretation.    Assessment/Plan: Diagnosis: Critical polytrauma Does the need for close, 24 hr/day medical supervision in concert with the patient's rehab needs make it unreasonable for this patient to be served in a less intensive setting? Yes Co-Morbidities requiring supervision/potential complications:  1) Obesity: provide dietary education 2) normocytic anemia: continue to monitor HGB 3) leukocytosis: continue to  monitor WBC HLD, anxiety and depression Due to bladder management, bowel management, safety, skin/wound care, disease management, medication administration, pain management, and patient education, does the patient require 24 hr/day rehab nursing? Yes Does the patient require coordinated care of a physician, rehab nurse, therapy disciplines of PT, OT to address physical and functional deficits in the context of the above medical diagnosis(es)? Yes Addressing deficits in the following areas: balance, endurance, locomotion, strength, transferring, bowel/bladder control, bathing, dressing, feeding, grooming, toileting, and psychosocial support Can the patient actively participate in an intensive therapy program of at least 3 hrs of therapy per day at least 5 days per week? Yes The potential for patient to make measurable gains while on inpatient rehab is excellent Anticipated functional outcomes upon discharge from inpatient rehab are modified independent  with PT, modified independent with OT, modified independent with SLP. Estimated rehab length of stay to reach the above functional goals is: 7-10 days Anticipated discharge destination: Home Overall Rehab/Functional Prognosis: excellent  POST ACUTE RECOMMENDATIONS: This patient's condition is appropriate for continued rehabilitative care in the following setting: CIR Patient has agreed to participate in recommended program. Yes Note that insurance prior authorization may be required for reimbursement for recommended care.   I have personally performed a face to face diagnostic evaluation of this patient. Additionally, I have examined the patient's medical record including any pertinent labs and radiographic images. If the physician assistant has documented in this note, I have reviewed and edited or otherwise concur with the physician assistant's documentation.  Thanks,  Horton Chin, MD 04/29/2023

## 2023-04-29 NOTE — Progress Notes (Signed)
PROGRESS NOTE  Morgan Potter  DOB: 03-28-1951  PCP: Medicine, [facility] AOZ:[id number]  DOA: 04/25/2023  LOS: 4 days  Hospital Day: 5  Brief narrative: Morgan Potter is a 72 y.o. female with PMH significant for HTN, HLD, anxiety, GERD, Jehovah's Witness 12/7, patient presented to ED after mechanical fall. Patient states she was rushing out of her gait with coffee on hand when she tripped over a pot and fell on her face.  No syncopal episode but was unable to get up and remained on the ground for approximately 15 minutes before she was able to notify her family.  Subsequently patient reported having severe pain in both arms, right worse than the left and left knee. Of note, patient is a Jehovah's Witness, declines blood products but is okay for expanders and iron infusions if needed.  Patient was noted to have sustained a laceration which was repaired by the ED provider.  CT imaging of the head and cervical spine did not note any acute intercranial or cervical abnormality.   X-rays revealed  -acute fracture involving the distal diaphysis of the right humerus, -communicated mildly displaced acute fracture of the left humeral head -nondisplaced transverse fracture of the left patella.   Admitted to [facility] Orthopedics consulted 12/9, underwent ORIF of right humerus  Subjective: Patient was seen and examined this afternoon.  Pleasant elderly Caucasian female.  Sitting up in recliner.  Not in distress.  Daughter at bedside. Chart reviewed. No fever in last 24 hours.  After an episode of 101 early yesterday morning. Blood pressure in 150s this morning Labs this morning with WC count normal at 8.7, hemoglobin 9.8  Assessment and plan: Right humeral shaft fracture s/p ORIF 12//9 Left humeral neck fracture Left patella nondisplaced fracture Orthopedics follow-up appreciated. Nonsurgical management for left humerus neck and left patella fracture. Weightbearing:  -NWB  RUE and LUE.  WBAT LLE in hinged brace ROM:  -RUE - unrestricted ROM -LUE - maintain sling.  Avoid passive or active shoulder motion for the first 2 weeks. OK for unrestricted elbow/wrist motion.  -LLE - maintain hinged brace locked in extension  Currently pain control with diclofenac 75 mg twice daily, Norco as needed, Tylenol as needed, Robaxin as needed, Dilaudid IV as needed For patella fracture, continue playmaker brace 0 to 60 degrees to be worn when out of bed PT recommended CIR At discharge, plan is to continue hydrocodone and Robaxin for pain control and aspirin 325 mg daily for 30 days   Eyebrow laceration, right Eyebrow laceration was repaired by the ED provider, suture removal was recommended in 7 days   Normocytic anemia Jehovah witness Patient declines blood products, but reports being okay with expanders and iron infusions if needed. H&H stable, will follow CBC Recent Labs    04/25/23 1245 04/26/23 0623 04/27/23 0453 04/28/23 0537 04/29/23 0529  HGB 11.9* 11.0* 10.6* 10.1* 9.8*  MCV 90.9 91.2 92.0 93.5 92.4   Essential hypertension BP controlled on Cozaar hydrochlorothiazide Continue IV hydralazine as needed with parameters   Anxiety and depression Continue Wellbutrin XL   Hyperlipidemia Continue pravastatin    Mobility: PT following CIR recommended  Goals of care   Code Status: Full Code     DVT prophylaxis:  SCDs Start: 04/27/23 1539 enoxaparin (LOVENOX) injection 40 mg Start: 04/25/23 1500   Antimicrobials: None Fluid: Currently on NS at 75 mL/h Consultants: Orthopedics Family Communication: Daughter at bedside  Status: Inpatient Level of care:  Biochemist, clinical  Patient is from: Home Needs to continue in-hospital care: Pending CIR Anticipated d/c to: Pending      Diet:  Diet Order             Diet regular Room service appropriate? Yes; Fluid consistency: Thin  Diet effective now                   Scheduled Meds:   buPROPion  300 mg Oral Daily   cholecalciferol  1,000 Units Oral Daily   diclofenac  75 mg Oral BID   docusate sodium  100 mg Oral BID   enoxaparin (LOVENOX) injection  40 mg Subcutaneous Q24H   escitalopram  20 mg Oral Daily   fentaNYL (SUBLIMAZE) injection  50 mcg Intravenous Once   ferrous sulfate  325 mg Oral Q breakfast   hydrochlorothiazide  12.5 mg Oral Daily   losartan  50 mg Oral Daily   pantoprazole  40 mg Oral Daily   polyethylene glycol  17 g Oral Daily   pravastatin  20 mg Oral q1800   senna-docusate  2 tablet Oral QHS    PRN meds: acetaminophen, bisacodyl, diphenhydrAMINE, hydrALAZINE, HYDROcodone-acetaminophen, HYDROcodone-acetaminophen, HYDROmorphone (DILAUDID) injection, ibuprofen, LORazepam, methocarbamol **OR** methocarbamol (ROBAXIN) injection, metoCLOPramide **OR** metoCLOPramide (REGLAN) injection, ondansetron **OR** ondansetron (ZOFRAN) IV   Infusions:   sodium chloride 75 mL/hr at 04/26/23 1416    Antimicrobials: Anti-infectives (From admission, onward)    Start     Dose/Rate Route Frequency Ordered Stop   04/27/23 2000  ceFAZolin (ANCEF) IVPB 2g/100 mL premix        2 g 200 mL/hr over 30 Minutes Intravenous Every 8 hours 04/27/23 1539 04/28/23 1323   04/27/23 1418  vancomycin (VANCOCIN) powder  Status:  Discontinued          As needed 04/27/23 1418 04/27/23 1428       Objective: Vitals:   04/29/23 0448 04/29/23 0749  BP: (!) 136/93 (!) 155/86  Pulse: 83 84  Resp: 15 18  Temp: (!) 97.4 F (36.3 C) 98.6 F (37 C)  SpO2: 98% 98%    Intake/Output Summary (Last 24 hours) at 04/29/2023 1337 Last data filed at 04/29/2023 0931 Gross per 24 hour  Intake 460 ml  Output 1200 ml  Net -740 ml   Filed Weights   04/27/23 1029  Weight: 102.1 kg   Weight change:  Body mass index is 33.23 kg/m.   Physical Exam: General exam: Pleasant, not in distress Skin: No rashes, lesions or ulcers. HEENT: Atraumatic, normocephalic, no obvious  bleeding Lungs: Clear to auscultation bilaterally CVS: Regular rate and rhythm, no murmur GI/Abd soft, nontender, nondistended, bowel sound present CNS: Alert, awake, oriented x 3 Psychiatry: Mood appropriate Extremities: Left knee on immobilizer, left shoulder on sling  Data Review: I have personally reviewed the laboratory data and studies available.  F/u labs ordered Unresulted Labs (From admission, onward)     Start     Ordered   04/30/23 0500  CBC with Differential/Platelet  Tomorrow morning,   R        04/29/23 1337   04/30/23 0500  Basic metabolic panel  Tomorrow morning,   R        04/29/23 1337           Total time spent in review of labs and imaging, patient evaluation, formulation of plan, documentation and communication with family: 45 minutes  Signed, Lorin Glass, MD [facility] 04/29/2023

## 2023-04-30 DIAGNOSIS — S42351A Displaced comminuted fracture of shaft of humerus, right arm, initial encounter for closed fracture: Secondary | ICD-10-CM | POA: Diagnosis not present

## 2023-04-30 DIAGNOSIS — W19XXXA Unspecified fall, initial encounter: Secondary | ICD-10-CM | POA: Diagnosis not present

## 2023-04-30 LAB — CBC WITH DIFFERENTIAL/PLATELET
Abs Immature Granulocytes: 0.08 10*3/uL — ABNORMAL HIGH (ref 0.00–0.07)
Basophils Absolute: 0.1 10*3/uL (ref 0.0–0.1)
Basophils Relative: 1 %
Eosinophils Absolute: 0.3 10*3/uL (ref 0.0–0.5)
Eosinophils Relative: 3 %
HCT: 28.4 % — ABNORMAL LOW (ref 36.0–46.0)
Hemoglobin: 9.3 g/dL — ABNORMAL LOW (ref 12.0–15.0)
Immature Granulocytes: 1 %
Lymphocytes Relative: 25 %
Lymphs Abs: 2.3 10*3/uL (ref 0.7–4.0)
MCH: 30 pg (ref 26.0–34.0)
MCHC: 32.7 g/dL (ref 30.0–36.0)
MCV: 91.6 fL (ref 80.0–100.0)
Monocytes Absolute: 1 10*3/uL (ref 0.1–1.0)
Monocytes Relative: 11 %
Neutro Abs: 5.5 10*3/uL (ref 1.7–7.7)
Neutrophils Relative %: 59 %
Platelets: 297 10*3/uL (ref 150–400)
RBC: 3.1 MIL/uL — ABNORMAL LOW (ref 3.87–5.11)
RDW: 15 % (ref 11.5–15.5)
WBC: 9.1 10*3/uL (ref 4.0–10.5)
nRBC: 0 % (ref 0.0–0.2)

## 2023-04-30 LAB — BASIC METABOLIC PANEL
Anion gap: 8 (ref 5–15)
BUN: 25 mg/dL — ABNORMAL HIGH (ref 8–23)
CO2: 24 mmol/L (ref 22–32)
Calcium: 9.2 mg/dL (ref 8.9–10.3)
Chloride: 100 mmol/L (ref 98–111)
Creatinine, Ser: 0.84 mg/dL (ref 0.44–1.00)
GFR, Estimated: >60 mL/min (ref >60)
Glucose, Bld: 112 mg/dL — ABNORMAL HIGH (ref 70–99)
Potassium: 3.7 mmol/L (ref 3.5–5.1)
Sodium: 132 mmol/L — ABNORMAL LOW (ref 135–145)

## 2023-04-30 NOTE — Plan of Care (Signed)
  Problem: Education: Goal: Knowledge of General Education information will improve Description: Including pain rating scale, medication(s)/side effects and non-pharmacologic comfort measures Outcome: Progressing   Problem: Health Behavior/Discharge Planning: Goal: Ability to manage health-related needs will improve Outcome: Progressing   Problem: Coping: Goal: Level of anxiety will decrease Outcome: Progressing   Problem: Pain Management: Goal: General experience of comfort will improve Outcome: Progressing

## 2023-04-30 NOTE — Progress Notes (Signed)
Physical Therapy Treatment Patient Details Name: Morgan Potter MRN: [id number] DOB: [birth year] Today's Date: 04/30/2023   History of Present Illness Pt is a 72 y/o female presenting after fall.  Xrays with R UE humeral shaft fx, L UE humeral neck fx and L knee transverse patella fx.  S/P ORIF R humerus 12/9. Non op L UE in sling and L LE in knee brace.  PMH includes: SOB.    PT Comments  Pt received in bed with left sling partially doffed. Readjusted for patient once sitting on edge of bed and encouraged pt to advocate for caregiver or RN to position correctly based on instructions provided by PT/OT. Pt overall continues to mobilize well, needing the most assist currently for transfers. Pt requiring moderate assist for transitions to standing and ambulating short distances in the hallway with minimal assist. Pt requesting to be assisted to bathroom towards end of session and was instructed to call RN when finished. Patient will benefit from intensive inpatient follow up therapy, >3 hours/day to address deficits, maximize functional mobility and decrease caregiver burden.    If plan is discharge home, recommend the following: A lot of help with bathing/dressing/bathroom;A lot of help with walking and/or transfers   Can travel by private vehicle        Equipment Recommendations  Wheelchair (measurements PT);Wheelchair cushion (measurements PT);BSC/3in1    Recommendations for Other Services       Precautions / Restrictions Precautions Precautions: Fall;Shoulder Type of Shoulder Precautions: L shoulder no ROM, ok for elbow/wrist/hand; R UE unrestricted ROM Shoulder Interventions: Shoulder sling/immobilizer;At all times;Off for dressing/bathing/exercises (L UE) Precaution Booklet Issued: No Required Braces or Orthoses: Sling;Other Brace Other Brace: knee brace L LE Restrictions Weight Bearing Restrictions Per Provider Order: Yes RUE Weight Bearing Per Provider Order: Non weight  bearing LUE Weight Bearing Per Provider Order: Non weight bearing LLE Weight Bearing Per Provider Order: Weight bearing as tolerated Other Position/Activity Restrictions: L UE sling and no shoulder ROM, R UE ROM unrestricted; playmakers brace 0-60 L knee     Mobility  Bed Mobility Overal bed mobility: Needs Assistance Bed Mobility: Supine to Sit     Supine to sit: Contact guard     General bed mobility comments: HOB elevated, cues for weightbearing precautions    Transfers Overall transfer level: Needs assistance Equipment used: None Transfers: Sit to/from Stand Sit to Stand: Mod assist           General transfer comment: Heavy modA to transition to standing from edge of bed, increased time to rise, cues to not use her RUE to push up    Ambulation/Gait Ambulation/Gait assistance: Min assist, +2 safety/equipment Gait Distance (Feet): 80 Feet Assistive device: None Gait Pattern/deviations: Step-through pattern, Decreased stance time - left, Antalgic, Decreased weight shift to left Gait velocity: decreased Gait velocity interpretation: <1.31 ft/sec, indicative of household ambulator   General Gait Details: MinA for dynamic balance, antalgic gait pattern   Stairs             Wheelchair Mobility     Tilt Bed    Modified Rankin (Stroke Patients Only)       Balance Overall balance assessment: Needs assistance Sitting-balance support: No upper extremity supported, Feet supported Sitting balance-Leahy Scale: Fair     Standing balance support: No upper extremity supported, During functional activity Standing balance-Leahy Scale: Poor Standing balance comment: relies on external support  Cognition Arousal: Alert Behavior During Therapy: Anxious Overall Cognitive Status: Within Functional Limits for tasks assessed                                          Exercises      General Comments         Pertinent Vitals/Pain Pain Assessment Pain Assessment: Faces Faces Pain Scale: Hurts a little bit Pain Location: L knee, B UEs Pain Descriptors / Indicators: Discomfort, Grimacing, Guarding Pain Intervention(s): Monitored during session    Home Living                          Prior Function            PT Goals (current goals can now be found in the care plan section) Acute Rehab PT Goals Patient Stated Goal: back to independence PT Goal Formulation: With patient Time For Goal Achievement: 05/12/23 Potential to Achieve Goals: Good Progress towards PT goals: Progressing toward goals    Frequency    Min 1X/week      PT Plan      Co-evaluation              AM-PAC PT "6 Clicks" Mobility   Outcome Measure  Help needed turning from your back to your side while in a flat bed without using bedrails?: A Little Help needed moving from lying on your back to sitting on the side of a flat bed without using bedrails?: A Little Help needed moving to and from a bed to a chair (including a wheelchair)?: A Little Help needed standing up from a chair using your arms (e.g., wheelchair or bedside chair)?: A Lot Help needed to walk in hospital room?: A Little Help needed climbing 3-5 steps with a railing? : Total 6 Click Score: 15    End of Session Equipment Utilized During Treatment: Gait belt;Other (comment) (L knee brace, LUE sling) Activity Tolerance: Patient tolerated treatment well Patient left: with call bell/phone within reach;with family/visitor present;Other (comment) (in bathroom) Nurse Communication: Mobility status PT Visit Diagnosis: Pain;Difficulty in walking, not elsewhere classified (R26.2);Other abnormalities of gait and mobility (R26.89) Pain - Right/Left: Left Pain - part of body: Knee;Shoulder     Time: 9562-1308 PT Time Calculation (min) (ACUTE ONLY): 30 min  Charges:    $Therapeutic Activity: 23-37 mins PT General Charges $$ ACUTE PT  VISIT: 1 Visit                     Lillia Pauls, PT, DPT Acute Rehabilitation Services Office [phone]    Norval Morton 04/30/2023, 1:04 PM

## 2023-04-30 NOTE — Progress Notes (Signed)
PROGRESS NOTE  Morgan Potter  DOB: 1951-03-20  PCP: Medicine, [facility] NWG:[id number]  DOA: 04/25/2023  LOS: 5 days  Hospital Day: 6  Brief narrative: Morgan Potter is a 72 y.o. female with PMH significant for HTN, HLD, anxiety, GERD, Jehovah's Witness 12/7, patient presented to ED after mechanical fall. Patient states she was rushing out of her gait with coffee on hand when she tripped over a pot and fell on her face.  No syncopal episode but was unable to get up and remained on the ground for approximately 15 minutes before she was able to notify her family.  Subsequently patient reported having severe pain in both arms, right worse than the left and left knee. Of note, patient is a Jehovah's Witness, declines blood products but is okay for expanders and iron infusions if needed.  Patient was noted to have sustained a laceration which was repaired by the ED provider.  CT imaging of the head and cervical spine did not note any acute intercranial or cervical abnormality.   X-rays revealed  -acute fracture involving the distal diaphysis of the right humerus, -communicated mildly displaced acute fracture of the left humeral head -nondisplaced transverse fracture of the left patella.   Admitted to [facility] consulted 12/9, underwent ORIF of right humerus  Subjective: Patient was seen and examined this afternoon. Lying down in bed.  Not in distress.  No new symptoms.  Feels good with ambulation but bending knee has been a challenge and hence using commode is very difficult.  Assessment and plan: Right humeral shaft fracture s/p ORIF 12//9 Left humeral neck fracture Left patella nondisplaced fracture Orthopedics follow-up appreciated. Nonsurgical management for left humerus neck and left patella fracture. Weightbearing:  -NWB RUE and LUE.  WBAT LLE in hinged brace ROM:  -RUE - unrestricted ROM -LUE - maintain sling.  Avoid passive or active shoulder  motion for the first 2 weeks. OK for unrestricted elbow/wrist motion.  -LLE - maintain hinged brace locked in extension  Currently pain control with diclofenac 75 mg twice daily, Norco as needed, Tylenol as needed, Robaxin as needed, Dilaudid IV as needed For patella fracture, continue playmaker brace 0 to 60 degrees to be worn when out of bed PT recommended CIR At discharge, plan is to continue hydrocodone and Robaxin for pain control and aspirin 325 mg daily for 30 days   Eyebrow laceration, right Eyebrow laceration was repaired by the ED provider, suture removal was recommended in 7 days   Normocytic anemia Jehovah witness Patient declines blood products, but reports being okay with expanders and iron infusions if needed. H&H stable, will follow CBC Recent Labs    04/26/23 0623 04/27/23 0453 04/28/23 0537 04/29/23 0529 04/30/23 0514  HGB 11.0* 10.6* 10.1* 9.8* 9.3*  MCV 91.2 92.0 93.5 92.4 91.6   Essential hypertension BP controlled on Cozaar hydrochlorothiazide Continue IV hydralazine as needed with parameters   Anxiety and depression Continue Wellbutrin XL   Hyperlipidemia Continue pravastatin    Mobility: PT following CIR recommended  Goals of care   Code Status: Full Code     DVT prophylaxis:  SCDs Start: 04/27/23 1539 enoxaparin (LOVENOX) injection 40 mg Start: 04/25/23 1500   Antimicrobials: None Fluid: No need of IV hydration. Consultants: Orthopedics Family Communication: Daughter at bedside  Status: Inpatient Level of care:  Telemetry Medical   Patient is from: Home Needs to continue in-hospital care: Pending CIR Anticipated d/c to: Pending      Diet:  Diet Order             Diet regular Room service appropriate? Yes; Fluid consistency: Thin  Diet effective now                   Scheduled Meds:  buPROPion  300 mg Oral Daily   cholecalciferol  1,000 Units Oral Daily   diclofenac  75 mg Oral BID   docusate sodium  100 mg Oral  BID   enoxaparin (LOVENOX) injection  40 mg Subcutaneous Q24H   escitalopram  20 mg Oral Daily   fentaNYL (SUBLIMAZE) injection  50 mcg Intravenous Once   ferrous sulfate  325 mg Oral Q breakfast   hydrochlorothiazide  12.5 mg Oral Daily   losartan  50 mg Oral Daily   pantoprazole  40 mg Oral Daily   polyethylene glycol  17 g Oral Daily   pravastatin  20 mg Oral q1800   senna-docusate  2 tablet Oral QHS    PRN meds: acetaminophen, bisacodyl, diphenhydrAMINE, hydrALAZINE, HYDROcodone-acetaminophen, HYDROcodone-acetaminophen, HYDROmorphone (DILAUDID) injection, ibuprofen, LORazepam, methocarbamol **OR** methocarbamol (ROBAXIN) injection, metoCLOPramide **OR** metoCLOPramide (REGLAN) injection, ondansetron **OR** ondansetron (ZOFRAN) IV   Infusions:     Antimicrobials: Anti-infectives (From admission, onward)    Start     Dose/Rate Route Frequency Ordered Stop   04/27/23 2000  ceFAZolin (ANCEF) IVPB 2g/100 mL premix        2 g 200 mL/hr over 30 Minutes Intravenous Every 8 hours 04/27/23 1539 04/28/23 1323   04/27/23 1418  vancomycin (VANCOCIN) powder  Status:  Discontinued          As needed 04/27/23 1418 04/27/23 1428       Objective: Vitals:   04/30/23 0416 04/30/23 0839  BP: 139/88 (!) 145/85  Pulse: 90 87  Resp: 18 16  Temp: 98.6 F (37 C) 98 F (36.7 C)  SpO2: 99% 100%    Intake/Output Summary (Last 24 hours) at 04/30/2023 1424 Last data filed at 04/30/2023 0800 Gross per 24 hour  Intake 240 ml  Output 850 ml  Net -610 ml   Filed Weights   04/27/23 1029  Weight: 102.1 kg   Weight change:  Body mass index is 33.23 kg/m.   Physical Exam: General exam: Pleasant, not in distress Skin: No rashes, lesions or ulcers. HEENT: Atraumatic, normocephalic, no obvious bleeding Lungs: Clear to auscultation bilaterally CVS: Regular rate and rhythm, no murmur GI/Abd soft, nontender, nondistended, bowel sound present CNS: Alert, awake, oriented x 3 Psychiatry: Mood  appropriate Extremities: Left knee on immobilizer, left shoulder on sling  Data Review: I have personally reviewed the laboratory data and studies available.  F/u labs ordered Unresulted Labs (From admission, onward)    None      Total time spent in review of labs and imaging, patient evaluation, formulation of plan, documentation and communication with family: 25 minutes  Signed, Lorin Glass, MD [facility] 04/30/2023

## 2023-04-30 NOTE — H&P (Signed)
Physical Medicine and Rehabilitation Admission H&P    Chief Complaint  Patient presents with   Fall  : HPI: Morgan Potter is a 72 year old left-handed female with past medical history of hypertension, anxiety, GERD, hyperlipidemia.  Per chart review patient lives with her family including 4 great-grandchildren from  37-year-old to a  2 year old.  Mobile home with 2 steps to entry.  Independent prior to admission.  She is Jehovah witness.  Presented 04/25/2023 after a fall when she was rushing out of her gait with a coffee in her hand when she tripped over a pot and fell on her face.  She denied loss of consciousness but was unable to get up and remained on the ground for approximately 15 minutes.  Admission chemistries unremarkable except WBC 12,500, CK1 28.  CT of the brain as well as cervical spine negative.  X-rays and imaging revealed acute fracture involving the distal diaphysis of the right humerus, comminuted mildly displaced acute fracture of the left humeral head and nondisplaced transverse fracture left patella.  Underwent open reduction trial fixation of right humeral shaft fracture 04/27/2023 per Dr. Jena Gauss..  Nonoperative left upper extremity placed in a sling as well as left lower extremity knee brace.  Nonweightbearing to right upper extremity with shoulder sling immobilizer at all times off for dressing bathing exercises as well as nonweightbearing to left upper extremity.  Weightbearing as tolerated to left lower extremity.  Acute blood loss anemia 9.3 and monitored.  Mild hyponatremia 132 and monitored.  Placed on Lovenox for DVT prophylaxis.  Therapy evaluations completed due to patient decreased functional mobility was admitted for a comprehensive rehab program.   Pt reports LBM yesterday- could go again today, but concerned/embarrassed since has female nurse.  Pain barely there at rest, but when moves, gets OOB, pain goes to 4-5/10.  Is tired- has been using Purewick, but is spilling  and getting her wet.  Sleeping OK.     Review of Systems  Constitutional:  Negative for chills and fever.  HENT:  Negative for hearing loss.   Eyes:  Negative for blurred vision and double vision.  Respiratory:  Negative for cough.        Shortness of breath with heavy exertion  Cardiovascular:  Negative for chest pain, palpitations and leg swelling.  Gastrointestinal:  Negative for abdominal pain, constipation, heartburn, nausea and vomiting.  Genitourinary:  Negative for dysuria, flank pain and hematuria.  Musculoskeletal:  Positive for joint pain and myalgias.  Skin:  Negative for rash.  Neurological:  Positive for dizziness.       Lightheadedness  Psychiatric/Behavioral:  Positive for depression.        Anxiety  All other systems reviewed and are negative.  Past Medical History:  Diagnosis Date   Dizziness and giddiness    Dyspnea    Fatigue    Lightheadedness    Palpitations    SOB (shortness of breath)    Weakness    Past Surgical History:  Procedure Laterality Date   APPENDECTOMY  1976   CESAREAN SECTION  1981   CESAREAN SECTION  1969   cholecystectomy  1976   ORIF HUMERUS FRACTURE Right 04/27/2023   Procedure: OPEN REDUCTION INTERNAL FIXATION (ORIF) HUMERAL SHAFT FRACTURE;  Surgeon: Roby Lofts, MD;  Location: [facility] OR;  Service: Orthopedics;  Laterality: Right;   Family History  Problem Relation Age of Onset   Arrhythmia Mother    Heart attack Father    Cancer Father    Social  History:  reports that she has never smoked. She has never used smokeless tobacco. No history on file for alcohol use and drug use. Allergies:  Allergies  Allergen Reactions   Codeine    Morphine And Codeine    Sulfa Antibiotics    Medications Prior to Admission  Medication Sig Dispense Refill   buPROPion (WELLBUTRIN XL) 300 MG 24 hr tablet Take 300 mg by mouth daily.     Cholecalciferol 125 MCG (5000 UT) capsule Take 5,000 Units by mouth daily.     [EXPIRED] diclofenac  (VOLTAREN) 75 MG EC tablet Take 75 mg by mouth 2 (two) times daily.     escitalopram (LEXAPRO) 20 MG tablet Take 20 mg by mouth daily.     esomeprazole (NEXIUM) 40 MG capsule Take 40 mg by mouth daily.     ferrous sulfate 325 (65 FE) MG tablet Take 325 mg by mouth daily with breakfast.     LORazepam (ATIVAN) 1 MG tablet Take 1 mg by mouth 2 (two) times daily.     losartan-hydrochlorothiazide (HYZAAR) 50-12.5 MG tablet TAKE 1 TABLET EVERY DAY     montelukast (SINGULAIR) 10 MG tablet Take 10 mg by mouth at bedtime.     pravastatin (PRAVACHOL) 20 MG tablet Take 20 mg by mouth at bedtime.     thiamine (VITAMIN B-1) 100 MG tablet Take 100 mg by mouth daily.        Home: Home Living Family/patient expects to be discharged to:: Private residence Living Arrangements: Children, Other (Comment) (4 great grandchildren 6 y/o-15 y/o) Available Help at Discharge: Family Type of Home: House (mobile home in front and built onto in the back) Home Access: Stairs to enter Secretary/administrator of Steps: 2 Entrance Stairs-Rails: None Home Layout: One level Bathroom Shower/Tub: Health visitor: Handicapped height Bathroom Accessibility: Yes Home Equipment: Information systems manager Additional Comments: possilby has wc   Functional History: Prior Function Prior Level of Function : Independent/Modified Independent, Driving  Functional Status:  Mobility: Bed Mobility Overal bed mobility: Needs Assistance Bed Mobility: Supine to Sit Supine to sit: Contact guard General bed mobility comments: HOB elevated, cues for weightbearing precautions Transfers Overall transfer level: Needs assistance Equipment used: None Transfers: Sit to/from Stand Sit to Stand: Mod assist Bed to/from chair/wheelchair/BSC transfer type:: Step pivot Step pivot transfers: Min assist, +2 physical assistance General transfer comment: Heavy modA to transition to standing from edge of bed, increased time to rise, cues to not  use her RUE to push up Ambulation/Gait Ambulation/Gait assistance: Min assist, +2 safety/equipment Gait Distance (Feet): 80 Feet Assistive device: None Gait Pattern/deviations: Step-through pattern, Decreased stance time - left, Antalgic, Decreased weight shift to left General Gait Details: MinA for dynamic balance, antalgic gait pattern Gait velocity: decreased Gait velocity interpretation: <1.31 ft/sec, indicative of household ambulator    ADL: ADL Overall ADL's : Needs assistance/impaired Eating/Feeding: Supervision/ safety, Bed level Eating/Feeding Details (indicate cue type and reason): pt able to self feed with L hand this morning per report; discussed use of 'sticky' foods for ease/decreased spillage and attempts to use R UE (althought L hand dominent).  She is able to reach to table to retrieve cup with R hand, encouraged only filling up styrofoam cup 1/2 way due to weight Grooming: Sitting, Minimal assistance Upper Body Dressing : Total assistance, Sitting Lower Body Dressing: Total assistance, +2 for physical assistance, Sit to/from stand Toilet Transfer: Maximal assistance, +2 for physical assistance, Stand-pivot Toileting- Clothing Manipulation and Hygiene: Total assistance, +2 for physical  assistance, Sit to/from stand Functional mobility during ADLs: +2 for physical assistance, Maximal assistance, Cueing for safety General ADL Comments: focused on UE exercises today  Cognition: Cognition Overall Cognitive Status: Within Functional Limits for tasks assessed Orientation Level: Oriented X4 Cognition Arousal: Alert Behavior During Therapy: Anxious Overall Cognitive Status: Within Functional Limits for tasks assessed  Physical Exam: Blood pressure 121/68, pulse 96, temperature 97.9 F (36.6 C), temperature source Oral, resp. rate 18, height 5\' 9"  (1.753 m), weight 102.1 kg, SpO2 95%. Physical Exam Vitals and nursing note reviewed.  Constitutional:      Appearance:  Normal appearance. She is obese.     Comments: Asleep initially, woke to verbal stimuli- sitting up in bed; wearing sling on LUE, awake, alert, appropriate afterwards, NAD  HENT:     Head: Normocephalic and atraumatic.     Comments: A little facial bruising- no facial asymmetry    Right Ear: External ear normal.     Left Ear: External ear normal.     Nose: Nose normal. No congestion.     Mouth/Throat:     Mouth: Mucous membranes are dry.     Pharynx: Oropharynx is clear. No oropharyngeal exudate.  Eyes:     General:        Right eye: No discharge.        Left eye: No discharge.     Extraocular Movements: Extraocular movements intact.  Cardiovascular:     Rate and Rhythm: Normal rate and regular rhythm.     Heart sounds: Normal heart sounds. No murmur heard.    No gallop.     Comments: A lot of PVCs on tele.  RRR on exam Pulmonary:     Effort: Pulmonary effort is normal. No respiratory distress.     Breath sounds: Normal breath sounds. No wheezing, rhonchi or rales.  Abdominal:     General: Bowel sounds are normal. There is no distension.     Palpations: Abdomen is soft.     Tenderness: There is no abdominal tenderness.  Genitourinary:    Comments: Purewick in place- cannister almost full Musculoskeletal:     Cervical back: Neck supple. No tenderness.     Comments: Cannot test deltoid or biceps B/L- due to humeral fx's or triceps WE/Grip and FA 5-/5 B/L  LE's- HF 4+/5; KE on R 4+/5; NT on L due to KI; DF/PF 5/5 B/L Wearing sling on LUE- because not allowed to move L shoulder at all- and KI on LLE  Skin:    General: Skin is warm and dry.     Comments: Extensive purple/yellow bruising on B/L Ue's Dressing R bicep- down entire bicep- looks C/D/I KI on LLE-   Neurological:     Mental Status: She is alert.     Comments: Patient is alert no acute distress oriented x 3 and follows commands. Intact to light touch in all 4 extremities  Psychiatric:        Mood and Affect: Mood  normal.        Behavior: Behavior normal.     Comments: A little anxious about great grandkids     Results for orders placed or performed during the hospital encounter of 04/25/23 (from the past 48 hours)  CBC     Status: Abnormal   Collection Time: 04/29/23  5:29 AM  Result Value Ref Range   WBC 8.7 4.0 - 10.5 K/uL   RBC 3.27 (L) 3.87 - 5.11 MIL/uL   Hemoglobin 9.8 (L) 12.0 - 15.0  g/dL   HCT 91.4 (L) 78.2 - 95.6 %   MCV 92.4 80.0 - 100.0 fL   MCH 30.0 26.0 - 34.0 pg   MCHC 32.5 30.0 - 36.0 g/dL   RDW 21.3 08.6 - 57.8 %   Platelets 233 150 - 400 K/uL   nRBC 0.0 0.0 - 0.2 %    Comment: Performed at [facility], [address], [place], [place] [address]  Basic metabolic panel     Status: Abnormal   Collection Time: 04/29/23  5:29 AM  Result Value Ref Range   Sodium 133 (L) 135 - 145 mmol/L   Potassium 3.7 3.5 - 5.1 mmol/L   Chloride 103 98 - 111 mmol/L   CO2 22 22 - 32 mmol/L   Glucose, Bld 110 (H) 70 - 99 mg/dL    Comment: Glucose reference range applies only to samples taken after fasting for at least 8 hours.   BUN 21 8 - 23 mg/dL   Creatinine, Ser 9.52 0.44 - 1.00 mg/dL   Calcium 9.0 8.9 - 84.1 mg/dL   GFR, Estimated >32 >44 mL/min    Comment: (NOTE) Calculated using the CKD-EPI Creatinine Equation (2021)    Anion gap 8 5 - 15    Comment: Performed at [facility], [address] [place], [place] [address]  CBC with Differential/Platelet     Status: Abnormal   Collection Time: 04/30/23  5:14 AM  Result Value Ref Range   WBC 9.1 4.0 - 10.5 K/uL   RBC 3.10 (L) 3.87 - 5.11 MIL/uL   Hemoglobin 9.3 (L) 12.0 - 15.0 g/dL   HCT 25.3 (L) 66.4 - 40.3 %   MCV 91.6 80.0 - 100.0 fL   MCH 30.0 26.0 - 34.0 pg   MCHC 32.7 30.0 - 36.0 g/dL   RDW 47.4 25.9 - 56.3 %   Platelets 297 150 - 400 K/uL   nRBC 0.0 0.0 - 0.2 %   Neutrophils Relative % 59 %   Neutro Abs 5.5 1.7 - 7.7 K/uL   Lymphocytes Relative 25 %   Lymphs Abs 2.3 0.7 - 4.0 K/uL   Monocytes Relative 11  %   Monocytes Absolute 1.0 0.1 - 1.0 K/uL   Eosinophils Relative 3 %   Eosinophils Absolute 0.3 0.0 - 0.5 K/uL   Basophils Relative 1 %   Basophils Absolute 0.1 0.0 - 0.1 K/uL   Immature Granulocytes 1 %   Abs Immature Granulocytes 0.08 (H) 0.00 - 0.07 K/uL    Comment: Performed at [facility], [address], [place], [place] [address]  Basic metabolic panel     Status: Abnormal   Collection Time: 04/30/23  5:14 AM  Result Value Ref Range   Sodium 132 (L) 135 - 145 mmol/L   Potassium 3.7 3.5 - 5.1 mmol/L   Chloride 100 98 - 111 mmol/L   CO2 24 22 - 32 mmol/L   Glucose, Bld 112 (H) 70 - 99 mg/dL    Comment: Glucose reference range applies only to samples taken after fasting for at least 8 hours.   BUN 25 (H) 8 - 23 mg/dL   Creatinine, Ser 3.32 0.44 - 1.00 mg/dL   Calcium 9.2 8.9 - 95.1 mg/dL   GFR, Estimated >88 >41 mL/min    Comment: (NOTE) Calculated using the CKD-EPI Creatinine Equation (2021)    Anion gap 8 5 - 15    Comment: Performed at [facility], [address]  [place], [place] [address]   No results found.    Blood pressure 121/68, pulse 96, temperature 97.9 F (36.6 C), temperature source Oral, resp. rate 18, height 5\' 9"  (1.753 m), weight 102.1 kg, SpO2 95%.  Medical Problem List and Plan: 1. Functional deficits secondary to polytrauma after fall 2012 12/06/2022  -patient may  shower- cover KI and her incisions and L sling- if can take shower, then able to  -ELOS/Goals: 14-16 days- min A  Admit to CIR  NWB B/L UE"s- Sling in LUE with no Shoulder ROM for 2 weeks at all RUE- ROM as tolerated  KI with WBAT on LLE 2.  Antithrombotics: -DVT/anticoagulation:  Pharmaceutical: Lovenox check vascular study  -antiplatelet therapy: N/A 3. Pain Management: Voltaren 75 mg p.o. twice daily Robaxin and hydrocodone as needed, Advil 400 mg every 6 hours as needed 4. Mood/Behavior/Sleep: Lexapro 20 mg daily, Wellbutrin 300 mg daily, Ativan 1 mg twice daily as  needed  -antipsychotic agents: N/A 5. Neuropsych/cognition: This patient is capable of making decisions on her own behalf. 6. Skin/Wound Care: Routine skin checks 7. Fluids/Electrolytes/Nutrition: Routine in and outs with follow-up chemistries 8.  Fracture involving the distal diaphysis of the right humerus.  Status post ORIF of humeral shaft fracture 04/27/2023.  Nonweightbearing 9.  Comminuted mildly displaced fracture of the left humeral head.  Nonoperative.  Nonweightbearing.  Shoulder sling at all times. 10.  Nondisplaced transverse fracture left patella.  Weightbearing as tolerated..  Knee immobilizer at all times 11.  Acute blood loss anemia.  Follow-up CBC.  Continue iron supplement 12.  Hypertension.  HCTZ 12.5 mg daily, Cozaar 50 mg daily.  Monitor with increased mobility 13.  Constipation.  Colace 100 mg twice daily, MiraLAX daily.  Dulcolax suppository as needed 14.  Hyperlipidemia.  Pravachol 15.  GERD.  Protonix   Mcarthur Rossetti Angiulli, PA-C 05/01/2023   I have personally performed a face to face diagnostic evaluation of this patient and formulated the key components of the plan.  Additionally, I have personally reviewed laboratory data, imaging studies, as well as relevant notes and concur with the physician assistant's documentation above.   The patient's status has not changed from the original H&P.  Any changes in documentation from the acute care chart have been noted above.

## 2023-04-30 NOTE — Progress Notes (Signed)
IP rehab admissions - we have approval from [facility] for acute inpatient rehab admission.  Currently rehab beds are full.  I will have my partner follow up tomorrow for bed availability and medical readiness.  Call for questions.  [phone]

## 2023-05-01 DIAGNOSIS — W19XXXA Unspecified fall, initial encounter: Secondary | ICD-10-CM | POA: Diagnosis not present

## 2023-05-01 DIAGNOSIS — S42351A Displaced comminuted fracture of shaft of humerus, right arm, initial encounter for closed fracture: Secondary | ICD-10-CM | POA: Diagnosis not present

## 2023-05-01 MED ORDER — MONTELUKAST SODIUM 10 MG PO TABS
10.0000 mg | ORAL_TABLET | Freq: Every day | ORAL | Status: DC
  Administered 2023-05-01: 10 mg via ORAL
  Filled 2023-05-01: qty 1

## 2023-05-01 MED ORDER — FLUTICASONE PROPIONATE 50 MCG/ACT NA SUSP
1.0000 | Freq: Every day | NASAL | Status: DC
  Administered 2023-05-01 – 2023-05-02 (×2): 1 via NASAL
  Filled 2023-05-01: qty 16

## 2023-05-01 NOTE — Progress Notes (Signed)
PROGRESS NOTE  Morgan Potter  DOB: [birth year]  PCP: Medicine, [facility] WUJ:[id number]  DOA: 04/25/2023  LOS: 6 days  Hospital Day: 7  Brief narrative: Morgan Potter is a 72 y.o. female with PMH significant for HTN, HLD, anxiety, GERD, Jehovah's Witness 12/7, patient presented to ED after mechanical fall. Patient states she was rushing out of her gait with coffee on hand when she tripped over a pot and fell on her face.  No syncopal episode but was unable to get up and remained on the ground for approximately 15 minutes before she was able to notify her family.  Subsequently patient reported having severe pain in both arms, right worse than the left and left knee. Of note, patient is a Jehovah's Witness, declines blood products but is okay for expanders and iron infusions if needed.  Patient was noted to have sustained a laceration which was repaired by the ED provider.  CT imaging of the head and cervical spine did not note any acute intercranial or cervical abnormality.   X-rays revealed  -acute fracture involving the distal diaphysis of the right humerus, -communicated mildly displaced acute fracture of the left humeral head -nondisplaced transverse fracture of the left patella.   Admitted to [facility] Orthopedics consulted 12/9, underwent ORIF of right humerus  Subjective: Patient was seen and examined this morning.  Not in distress but not in symptoms.  Sitting up in recliner.  Pending CIR.  Likely to have bed available tomorrow. Patient reports she was taking allergy medicines at home which she is currently not getting and hence developing nasal congestion and cough.  Assessment and plan: Right humeral shaft fracture s/p ORIF 12//9 Left humeral neck fracture Left patella nondisplaced fracture Orthopedics follow-up appreciated. Nonsurgical management for left humerus neck and left patella fracture. Weightbearing:  -NWB RUE and LUE.  WBAT LLE in hinged  brace ROM:  -RUE - unrestricted ROM -LUE - maintain sling.  Avoid passive or active shoulder motion for the first 2 weeks. OK for unrestricted elbow/wrist motion.  -LLE - maintain hinged brace locked in extension  Currently pain control with diclofenac 75 mg twice daily, Norco as needed, Tylenol as needed, Robaxin as needed, Dilaudid IV as needed For patella fracture, continue playmaker brace 0 to 60 degrees to be worn when out of bed PT recommended CIR At discharge, plan is to continue hydrocodone and Robaxin for pain control and aspirin 325 mg daily for 30 days   Eyebrow laceration, right Eyebrow laceration was repaired by the ED provider, suture removal was recommended in 7 days   Normocytic anemia Jehovah witness Patient declines blood products, but reports being okay with expanders and iron infusions if needed. H&H stable, will follow CBC Recent Labs    04/26/23 0623 04/27/23 0453 04/28/23 0537 04/29/23 0529 04/30/23 0514  HGB 11.0* 10.6* 10.1* 9.8* 9.3*  MCV 91.2 92.0 93.5 92.4 91.6   Essential hypertension BP controlled on Cozaar hydrochlorothiazide Continue IV hydralazine as needed with parameters   Anxiety and depression Continue Wellbutrin XL   Hyperlipidemia Continue pravastatin  Allergies Patient reports she was taking allergy medicines at home which she is currently not getting and hence developing nasal congestion and cough. Resume Singulair and Flonase.    Mobility: PT following CIR recommended  Goals of care   Code Status: Full Code     DVT prophylaxis:  SCDs Start: 04/27/23 1539 enoxaparin (LOVENOX) injection 40 mg Start: 04/25/23 1500   Antimicrobials: None Fluid: No need of  IV hydration. Consultants: Orthopedics Family Communication: Family not at bedside today  Status: Inpatient Level of care:  Telemetry Medical   Patient is from: Home Needs to continue in-hospital care: Pending CIR Anticipated d/c to: Likely  tomorrow      Diet:  Diet Order             Diet regular Room service appropriate? Yes; Fluid consistency: Thin  Diet effective now                   Scheduled Meds:  buPROPion  300 mg Oral Daily   cholecalciferol  1,000 Units Oral Daily   diclofenac  75 mg Oral BID   docusate sodium  100 mg Oral BID   enoxaparin (LOVENOX) injection  40 mg Subcutaneous Q24H   escitalopram  20 mg Oral Daily   fentaNYL (SUBLIMAZE) injection  50 mcg Intravenous Once   ferrous sulfate  325 mg Oral Q breakfast   fluticasone  1 spray Each Nare Daily   hydrochlorothiazide  12.5 mg Oral Daily   losartan  50 mg Oral Daily   montelukast  10 mg Oral QHS   pantoprazole  40 mg Oral Daily   polyethylene glycol  17 g Oral Daily   pravastatin  20 mg Oral q1800   senna-docusate  2 tablet Oral QHS    PRN meds: acetaminophen, bisacodyl, diphenhydrAMINE, hydrALAZINE, HYDROcodone-acetaminophen, HYDROcodone-acetaminophen, HYDROmorphone (DILAUDID) injection, ibuprofen, LORazepam, methocarbamol **OR** methocarbamol (ROBAXIN) injection, metoCLOPramide **OR** metoCLOPramide (REGLAN) injection, ondansetron **OR** ondansetron (ZOFRAN) IV   Infusions:     Antimicrobials: Anti-infectives (From admission, onward)    Start     Dose/Rate Route Frequency Ordered Stop   04/27/23 2000  ceFAZolin (ANCEF) IVPB 2g/100 mL premix        2 g 200 mL/hr over 30 Minutes Intravenous Every 8 hours 04/27/23 1539 04/28/23 1323   04/27/23 1418  vancomycin (VANCOCIN) powder  Status:  Discontinued          As needed 04/27/23 1418 04/27/23 1428       Objective: Vitals:   05/01/23 0445 05/01/23 0832  BP: 121/68 (!) 142/106  Pulse:  (!) 108  Resp:  15  Temp: 97.9 F (36.6 C) 98.5 F (36.9 C)  SpO2:  (!) 88%    Intake/Output Summary (Last 24 hours) at 05/01/2023 1313 Last data filed at 05/01/2023 0800 Gross per 24 hour  Intake 480 ml  Output 2502 ml  Net -2022 ml   Filed Weights   04/27/23 1029  Weight: 102.1  kg   Weight change:  Body mass index is 33.23 kg/m.   Physical Exam: General exam: Pleasant, not in distress Skin: No rashes, lesions or ulcers. HEENT: Atraumatic, normocephalic, no obvious bleeding Lungs: Clear to auscultation bilaterally CVS: Regular rate and rhythm, no murmur GI/Abd soft, nontender, nondistended, bowel sound present CNS: Alert, awake, oriented x 3 Psychiatry: Mood appropriate Extremities: Left knee on immobilizer, left shoulder on sling  Data Review: I have personally reviewed the laboratory data and studies available.  F/u labs ordered Unresulted Labs (From admission, onward)     Start     Ordered   Signed and Held  CBC  (enoxaparin (LOVENOX)    CrCl >/= 30 ml/min)  Once,   R       Comments: Baseline for enoxaparin therapy IF NOT ALREADY DRAWN.  Notify MD if PLT < 100 K.    Signed and Held   Signed and Held  Creatinine, serum  (enoxaparin (LOVENOX)  CrCl >/= 30 ml/min)  Once,   R       Comments: Baseline for enoxaparin therapy IF NOT ALREADY DRAWN.    Signed and Held   Signed and Held  Creatinine, serum  (enoxaparin (LOVENOX)    CrCl >/= 30 ml/min)  Weekly,   R     Comments: while on enoxaparin therapy    Signed and Held   Signed and Held  Comprehensive metabolic panel  Once,   R        Signed and Held   Signed and Held  CBC with Differential/Platelet  Once,   R        Signed and Held           Total time spent in review of labs and imaging, patient evaluation, formulation of plan, documentation and communication with family: 25 minutes  Signed, Lorin Glass, MD [facility] 05/01/2023

## 2023-05-01 NOTE — Plan of Care (Signed)
  Problem: Education: Goal: Knowledge of General Education information will improve Description: Including pain rating scale, medication(s)/side effects and non-pharmacologic comfort measures Outcome: Progressing   Problem: Activity: Goal: Risk for activity intolerance will decrease Outcome: Progressing   Problem: Coping: Goal: Level of anxiety will decrease Outcome: Progressing   Problem: Pain Management: Goal: General experience of comfort will improve Outcome: Progressing   Problem: Safety: Goal: Ability to remain free from injury will improve Outcome: Progressing

## 2023-05-01 NOTE — Progress Notes (Signed)
Inpatient Rehab Admissions Coordinator:  Saw pt at bedside. Informed her that plan to admit her to CIR tomorrow. Dr. Berline Chough will assess pt in the morning. Will continue to follow.   Wolfgang Phoenix, MS, CCC-SLP Admissions Coordinator [phone]

## 2023-05-01 NOTE — Progress Notes (Signed)
Occupational Therapy Treatment Patient Details Name: Morgan Potter MRN: [id number] DOB: 1950/07/25 Today's Date: 05/01/2023   History of present illness Pt is a 72 y/o female presenting after fall.  Xrays with R UE humeral shaft fx, L UE humeral neck fx and L knee transverse patella fx.  S/P ORIF R humerus 12/9. Non op L UE in sling and L LE in knee brace.  PMH includes: SOB.   OT comments  Patient received in bed and had completed breakfast and states she was able to feed self using LUE. Patient was CGA to get to EOB and required assistance to readjust LUE sling. Patient able to transfer to recliner with mod assist to stand and min assist for transfer. Patient performed AAROM to RUE shoulder and AROM to BUE elbow and LUE wrist and hand. Patient asked to use bathroom with mod assist to stand from recliner and min assist to ambulate to bathroom. Nursing assisted patient with completing toileting tasks due to not wanting a man to help. Patient will benefit from intensive inpatient follow up therapy, >3 hours/day to continue to address self care and functional transfers. Acute OT to continue to follow.       If plan is discharge home, recommend the following:  Assistance with cooking/housework;Help with stairs or ramp for entrance;Assist for transportation;A lot of help with walking and/or transfers;A lot of help with bathing/dressing/bathroom   Equipment Recommendations  Other (comment) (defer)    Recommendations for Other Services      Precautions / Restrictions Precautions Precautions: Fall;Shoulder Type of Shoulder Precautions: L shoulder no ROM, ok for elbow/wrist/hand; R UE unrestricted ROM Shoulder Interventions: Shoulder sling/immobilizer;At all times;Off for dressing/bathing/exercises (LUE) Precaution Booklet Issued: No Required Braces or Orthoses: Sling;Other Brace Other Brace: knee brace L LE Restrictions Weight Bearing Restrictions Per Provider Order: Yes RUE Weight Bearing Per  Provider Order: Non weight bearing LUE Weight Bearing Per Provider Order: Non weight bearing LLE Weight Bearing Per Provider Order: Weight bearing as tolerated Other Position/Activity Restrictions: L UE sling and no shoulder ROM, R UE ROM unrestricted; playmakers brace 0-60 L knee       Mobility Bed Mobility Overal bed mobility: Needs Assistance Bed Mobility: Supine to Sit     Supine to sit: Contact guard     General bed mobility comments: cues for WB precautions    Transfers Overall transfer level: Needs assistance Equipment used: None Transfers: Sit to/from Stand, Bed to chair/wheelchair/BSC Sit to Stand: Mod assist     Step pivot transfers: Min assist     General transfer comment: mod assist for sit to stands from EOB and recliner     Balance Overall balance assessment: Needs assistance Sitting-balance support: No upper extremity supported, Feet supported Sitting balance-Leahy Scale: Fair Sitting balance - Comments: EOB   Standing balance support: No upper extremity supported, During functional activity Standing balance-Leahy Scale: Poor Standing balance comment: relies on external support                           ADL either performed or assessed with clinical judgement   ADL Overall ADL's : Needs assistance/impaired Eating/Feeding: Set up;Bed level Eating/Feeding Details (indicate cue type and reason): patient had completed breakfast at bedlevel and patient states she fed self with LUE Grooming: Wash/dry hands;Wash/dry face;Set up Grooming Details (indicate cue type and reason): with RUE         Upper Body Dressing : Moderate assistance;Sitting Upper Body Dressing Details (  indicate cue type and reason): gown to cover back     Toilet Transfer: Moderate assistance;Ambulation;Regular Toilet;BSC/3in1 Statistician Details (indicate cue type and reason): mod assist for sit to stand and assistance to lower to [facility] over toilet Toileting- Clothing  Manipulation and Hygiene: Total assistance;Sit to/from stand         General ADL Comments: Nursing assisted with toilet hygiene due to patient stating she did not want a man to assist    Extremity/Trunk Assessment              Vision       Perception     Praxis      Cognition Arousal: Alert Behavior During Therapy: Anxious Overall Cognitive Status: Within Functional Limits for tasks assessed                                          Exercises Exercises: General Upper Extremity General Exercises - Upper Extremity Elbow Flexion: AROM, Both, 10 reps, Seated Elbow Extension: AROM, Both, 10 reps, Seated Wrist Flexion: AROM, Left, 10 reps, Seated Wrist Extension: AROM, Left, 10 reps, Seated Shoulder Exercises Shoulder Flexion: Right, AAROM, 10 reps (to 60-70degrees) Shoulder Extension: AAROM, 10 reps, Right Shoulder ABduction: AAROM, Right, 10 reps (to 45 degrees) Shoulder External Rotation: AROM, 10 reps, Right    Shoulder Instructions       General Comments      Pertinent Vitals/ Pain       Pain Assessment Pain Assessment: Faces Faces Pain Scale: Hurts a little bit Pain Location: L knee, B UEs Pain Descriptors / Indicators: Discomfort, Grimacing, Guarding Pain Intervention(s): Limited activity within patient's tolerance, Monitored during session, Repositioned  Home Living                                          Prior Functioning/Environment              Frequency  Min 1X/week        Progress Toward Goals  OT Goals(current goals can now be found in the care plan section)  Progress towards OT goals: Progressing toward goals  Acute Rehab OT Goals Patient Stated Goal: get better OT Goal Formulation: With patient Time For Goal Achievement: 05/12/23 Potential to Achieve Goals: Good ADL Goals Pt Will Perform Grooming: with set-up;sitting Pt Will Perform Upper Body Dressing: with mod assist;sitting Pt Will  Perform Lower Body Dressing: sit to/from stand;with mod assist Pt Will Transfer to Toilet: with min assist;bedside commode;ambulating;stand pivot transfer Pt Will Perform Toileting - Clothing Manipulation and hygiene: with mod assist;sit to/from stand;sitting/lateral leans Pt/caregiver will Perform Home Exercise Program: Increased ROM;Right Upper extremity;With written HEP provided  Plan      Co-evaluation                 AM-PAC OT "6 Clicks" Daily Activity     Outcome Measure   Help from another person eating meals?: A Little Help from another person taking care of personal grooming?: A Little Help from another person toileting, which includes using toliet, bedpan, or urinal?: A Lot Help from another person bathing (including washing, rinsing, drying)?: A Lot Help from another person to put on and taking off regular upper body clothing?: A Lot Help from another person to put on and taking off regular  lower body clothing?: Total 6 Click Score: 13    End of Session Equipment Utilized During Treatment: Gait belt;Other (comment) (LUE sling an dLLE brace)  OT Visit Diagnosis: Other abnormalities of gait and mobility (R26.89);Muscle weakness (generalized) (M62.81);History of falling (Z91.81);Pain Pain - Right/Left: Left Pain - part of body: Shoulder;Arm;Knee;Leg   Activity Tolerance Patient tolerated treatment well   Patient Left in chair;with call bell/phone within reach;with chair alarm set   Nurse Communication Mobility status        Time: 1610-9604 OT Time Calculation (min): 32 min  Charges: OT General Charges $OT Visit: 1 Visit OT Treatments $Self Care/Home Management : 8-22 mins $Therapeutic Exercise: 8-22 mins  Alfonse Flavors, OTA Acute Rehabilitation Services  Office [phone]   Dewain Penning 05/01/2023, 9:31 AM

## 2023-05-01 NOTE — Care Management Important Message (Signed)
Important Message  Patient Details  Name: Morgan Potter MRN: [id number] Date of Birth: 06-11-1950   Important Message Given:  Yes - Medicare IM     Sherilyn Banker 05/01/2023, 3:48 PM

## 2023-05-02 ENCOUNTER — Inpatient Hospital Stay (HOSPITAL_COMMUNITY)
Admission: AD | Admit: 2023-05-02 | Discharge: 2023-05-12 | DRG: 560 | Disposition: A | Discharge status: Home Health | Payer: Medicare Other | Source: Intra-hospital | Attending: Physical Medicine and Rehabilitation | Admitting: Physical Medicine and Rehabilitation

## 2023-05-02 DIAGNOSIS — K5900 Constipation, unspecified: Secondary | ICD-10-CM | POA: Diagnosis not present

## 2023-05-02 DIAGNOSIS — E871 Hypo-osmolality and hyponatremia: Secondary | ICD-10-CM | POA: Diagnosis not present

## 2023-05-02 DIAGNOSIS — K219 Gastro-esophageal reflux disease without esophagitis: Secondary | ICD-10-CM | POA: Diagnosis present

## 2023-05-02 DIAGNOSIS — S82002D Unspecified fracture of left patella, subsequent encounter for closed fracture with routine healing: Secondary | ICD-10-CM

## 2023-05-02 DIAGNOSIS — F419 Anxiety disorder, unspecified: Secondary | ICD-10-CM | POA: Diagnosis present

## 2023-05-02 DIAGNOSIS — I10 Essential (primary) hypertension: Secondary | ICD-10-CM | POA: Diagnosis not present

## 2023-05-02 DIAGNOSIS — W010XXD Fall on same level from slipping, tripping and stumbling without subsequent striking against object, subsequent encounter: Secondary | ICD-10-CM | POA: Diagnosis not present

## 2023-05-02 DIAGNOSIS — T07XXXA Unspecified multiple injuries, initial encounter: Secondary | ICD-10-CM | POA: Diagnosis not present

## 2023-05-02 DIAGNOSIS — S01111D Laceration without foreign body of right eyelid and periocular area, subsequent encounter: Secondary | ICD-10-CM | POA: Diagnosis not present

## 2023-05-02 DIAGNOSIS — S82002A Unspecified fracture of left patella, initial encounter for closed fracture: Secondary | ICD-10-CM | POA: Diagnosis not present

## 2023-05-02 DIAGNOSIS — L299 Pruritus, unspecified: Secondary | ICD-10-CM | POA: Diagnosis not present

## 2023-05-02 DIAGNOSIS — Z9049 Acquired absence of other specified parts of digestive tract: Secondary | ICD-10-CM

## 2023-05-02 DIAGNOSIS — E785 Hyperlipidemia, unspecified: Secondary | ICD-10-CM | POA: Diagnosis present

## 2023-05-02 DIAGNOSIS — T402X5A Adverse effect of other opioids, initial encounter: Secondary | ICD-10-CM | POA: Diagnosis not present

## 2023-05-02 DIAGNOSIS — Z882 Allergy status to sulfonamides status: Secondary | ICD-10-CM | POA: Diagnosis not present

## 2023-05-02 DIAGNOSIS — D62 Acute posthemorrhagic anemia: Secondary | ICD-10-CM | POA: Diagnosis present

## 2023-05-02 DIAGNOSIS — Z79899 Other long term (current) drug therapy: Secondary | ICD-10-CM | POA: Diagnosis not present

## 2023-05-02 DIAGNOSIS — Z885 Allergy status to narcotic agent status: Secondary | ICD-10-CM

## 2023-05-02 DIAGNOSIS — S42292A Other displaced fracture of upper end of left humerus, initial encounter for closed fracture: Secondary | ICD-10-CM | POA: Diagnosis not present

## 2023-05-02 DIAGNOSIS — G8918 Other acute postprocedural pain: Secondary | ICD-10-CM | POA: Diagnosis not present

## 2023-05-02 DIAGNOSIS — F418 Other specified anxiety disorders: Secondary | ICD-10-CM | POA: Diagnosis not present

## 2023-05-02 DIAGNOSIS — K5901 Slow transit constipation: Secondary | ICD-10-CM | POA: Diagnosis not present

## 2023-05-02 DIAGNOSIS — S42292D Other displaced fracture of upper end of left humerus, subsequent encounter for fracture with routine healing: Principal | ICD-10-CM

## 2023-05-02 DIAGNOSIS — W19XXXA Unspecified fall, initial encounter: Secondary | ICD-10-CM | POA: Diagnosis not present

## 2023-05-02 DIAGNOSIS — Z8249 Family history of ischemic heart disease and other diseases of the circulatory system: Secondary | ICD-10-CM | POA: Diagnosis not present

## 2023-05-02 DIAGNOSIS — S42351A Displaced comminuted fracture of shaft of humerus, right arm, initial encounter for closed fracture: Secondary | ICD-10-CM | POA: Diagnosis not present

## 2023-05-02 MED ORDER — PANTOPRAZOLE SODIUM 40 MG PO TBEC
40.0000 mg | DELAYED_RELEASE_TABLET | Freq: Every day | ORAL | Status: DC
  Administered 2023-05-03 – 2023-05-12 (×10): 40 mg via ORAL
  Filled 2023-05-02 (×10): qty 1

## 2023-05-02 MED ORDER — FERROUS SULFATE 325 (65 FE) MG PO TABS
325.0000 mg | ORAL_TABLET | Freq: Every day | ORAL | Status: DC
  Administered 2023-05-03 – 2023-05-12 (×10): 325 mg via ORAL
  Filled 2023-05-02 (×10): qty 1

## 2023-05-02 MED ORDER — IBUPROFEN 400 MG PO TABS: 400.0000 mg | ORAL_TABLET | Freq: Four times a day (QID) | ORAL | Status: AC | PRN

## 2023-05-02 MED ORDER — PRAVASTATIN SODIUM 10 MG PO TABS
20.0000 mg | ORAL_TABLET | Freq: Every day | ORAL | Status: DC
  Administered 2023-05-02 – 2023-05-11 (×10): 20 mg via ORAL
  Filled 2023-05-02 (×11): qty 2

## 2023-05-02 MED ORDER — BISACODYL 10 MG RE SUPP: 10.0000 mg | Freq: Every day | RECTAL | Status: DC | PRN

## 2023-05-02 MED ORDER — METHOCARBAMOL 500 MG PO TABS: 500.0000 mg | ORAL_TABLET | Freq: Four times a day (QID) | ORAL | Status: DC | PRN

## 2023-05-02 MED ORDER — SENNOSIDES-DOCUSATE SODIUM 8.6-50 MG PO TABS: 2.0000 | ORAL_TABLET | Freq: Every day | ORAL | Status: DC

## 2023-05-02 MED ORDER — POLYETHYLENE GLYCOL 3350 17 G PO PACK
17.0000 g | PACK | Freq: Every day | ORAL | Status: DC
  Administered 2023-05-02: 17 g via ORAL
  Filled 2023-05-02 (×4): qty 1

## 2023-05-02 MED ORDER — ENOXAPARIN SODIUM 40 MG/0.4ML IJ SOSY: 40.0000 mg | PREFILLED_SYRINGE | INTRAMUSCULAR | Status: DC

## 2023-05-02 MED ORDER — ONDANSETRON HCL 4 MG/2ML IJ SOLN: 4.0000 mg | Freq: Four times a day (QID) | INTRAMUSCULAR | Status: DC | PRN

## 2023-05-02 MED ORDER — LOSARTAN POTASSIUM 50 MG PO TABS
50.0000 mg | ORAL_TABLET | Freq: Every day | ORAL | Status: DC
  Administered 2023-05-03 – 2023-05-12 (×10): 50 mg via ORAL
  Filled 2023-05-02 (×10): qty 1

## 2023-05-02 MED ORDER — ACETAMINOPHEN 500 MG PO TABS: 1000.0000 mg | ORAL_TABLET | Freq: Three times a day (TID) | ORAL | Status: DC

## 2023-05-02 MED ORDER — HYDROCHLOROTHIAZIDE 12.5 MG PO TABS
12.5000 mg | ORAL_TABLET | Freq: Every day | ORAL | Status: DC
  Administered 2023-05-03 – 2023-05-12 (×10): 12.5 mg via ORAL
  Filled 2023-05-02 (×10): qty 1

## 2023-05-02 MED ORDER — IBUPROFEN 400 MG PO TABS
400.0000 mg | ORAL_TABLET | Freq: Four times a day (QID) | ORAL | Status: DC | PRN
  Administered 2023-05-03 – 2023-05-04 (×3): 400 mg via ORAL
  Filled 2023-05-02 (×5): qty 1

## 2023-05-02 MED ORDER — ONDANSETRON HCL 4 MG PO TABS: 4.0000 mg | ORAL_TABLET | Freq: Four times a day (QID) | ORAL | Status: DC | PRN

## 2023-05-02 MED ORDER — METHOCARBAMOL 1000 MG/10ML IJ SOLN: 500.0000 mg | Freq: Four times a day (QID) | INTRAMUSCULAR | Status: DC | PRN

## 2023-05-02 MED ORDER — POLYETHYLENE GLYCOL 3350 17 G PO PACK: 17.0000 g | PACK | Freq: Every day | ORAL | Status: DC | PRN

## 2023-05-02 MED ORDER — ENOXAPARIN SODIUM 40 MG/0.4ML IJ SOSY
40.0000 mg | PREFILLED_SYRINGE | INTRAMUSCULAR | Status: DC
Start: 2023-05-02 — End: 2023-05-11
  Administered 2023-05-02 – 2023-05-10 (×9): 40 mg via SUBCUTANEOUS
  Filled 2023-05-02 (×9): qty 0.4

## 2023-05-02 MED ORDER — ACETAMINOPHEN 500 MG PO TABS
1000.0000 mg | ORAL_TABLET | Freq: Three times a day (TID) | ORAL | Status: DC
  Administered 2023-05-02: 1000 mg via ORAL
  Filled 2023-05-02: qty 2

## 2023-05-02 MED ORDER — DOCUSATE SODIUM 100 MG PO CAPS
100.0000 mg | ORAL_CAPSULE | Freq: Two times a day (BID) | ORAL | Status: DC
  Administered 2023-05-03 – 2023-05-12 (×19): 100 mg via ORAL
  Filled 2023-05-02 (×19): qty 1

## 2023-05-02 MED ORDER — OXYCODONE HCL 5 MG PO TABS: 5.0000 mg | ORAL_TABLET | Freq: Four times a day (QID) | ORAL | Status: DC | PRN

## 2023-05-02 MED ORDER — BUPROPION HCL ER (XL) 300 MG PO TB24
300.0000 mg | ORAL_TABLET | Freq: Every day | ORAL | Status: DC
  Administered 2023-05-03 – 2023-05-12 (×10): 300 mg via ORAL
  Filled 2023-05-02 (×10): qty 1

## 2023-05-02 MED ORDER — LORAZEPAM 0.5 MG PO TABS
1.0000 mg | ORAL_TABLET | Freq: Two times a day (BID) | ORAL | Status: DC | PRN
  Administered 2023-05-02 – 2023-05-03 (×3): 1 mg via ORAL
  Filled 2023-05-02 (×3): qty 2

## 2023-05-02 MED ORDER — VITAMIN D 25 MCG (1000 UNIT) PO TABS
1000.0000 [IU] | ORAL_TABLET | Freq: Every day | ORAL | Status: DC
  Administered 2023-05-03 – 2023-05-12 (×10): 1000 [IU] via ORAL
  Filled 2023-05-02 (×10): qty 1

## 2023-05-02 MED ORDER — FLUTICASONE PROPIONATE 50 MCG/ACT NA SUSP: 1.0000 | Freq: Every day | NASAL | Status: AC

## 2023-05-02 MED ORDER — METHOCARBAMOL 500 MG PO TABS
500.0000 mg | ORAL_TABLET | Freq: Four times a day (QID) | ORAL | Status: DC | PRN
  Administered 2023-05-04 – 2023-05-11 (×13): 500 mg via ORAL
  Filled 2023-05-02 (×13): qty 1

## 2023-05-02 MED ORDER — DIPHENHYDRAMINE HCL 12.5 MG/5ML PO ELIX: 12.5000 mg | ORAL_SOLUTION | ORAL | Status: DC | PRN

## 2023-05-02 MED ORDER — ESCITALOPRAM OXALATE 10 MG PO TABS
20.0000 mg | ORAL_TABLET | Freq: Every day | ORAL | Status: DC
  Administered 2023-05-03 – 2023-05-12 (×10): 20 mg via ORAL
  Filled 2023-05-02 (×10): qty 2

## 2023-05-02 MED ORDER — SENNOSIDES-DOCUSATE SODIUM 8.6-50 MG PO TABS
2.0000 | ORAL_TABLET | Freq: Every day | ORAL | Status: DC
  Administered 2023-05-03 – 2023-05-11 (×8): 2 via ORAL
  Filled 2023-05-02 (×9): qty 2

## 2023-05-02 MED ORDER — HYDROCODONE-ACETAMINOPHEN 5-325 MG PO TABS
1.0000 | ORAL_TABLET | Freq: Four times a day (QID) | ORAL | Status: DC | PRN
  Filled 2023-05-02: qty 1

## 2023-05-02 NOTE — Discharge Summary (Signed)
Physician Discharge Summary  Morgan Potter BJY:[id number] DOB: 1950-05-20 DOA: 04/25/2023  PCP: Medicine, [facility]  Admit date: 04/25/2023 Discharge date: 05/02/2023  Admitted From: Home Discharge disposition: CIR   Brief narrative: Morgan Potter is a 72 y.o. female with PMH significant for HTN, HLD, anxiety, GERD, Jehovah's Witness 12/7, patient presented to ED after mechanical fall. Patient states she was rushing out of her gait with coffee on hand when she tripped over a pot and fell on her face.  No syncopal episode but was unable to get up and remained on the ground for approximately 15 minutes before she was able to notify her family.  Subsequently patient reported having severe pain in both arms, right worse than the left and left knee. Of note, patient is a Jehovah's Witness, declines blood products but is okay for expanders and iron infusions if needed.  Patient was noted to have sustained a laceration which was repaired by the ED provider.  CT imaging of the head and cervical spine did not note any acute intercranial or cervical abnormality.   X-rays revealed  -acute fracture involving the distal diaphysis of the right humerus, -communicated mildly displaced acute fracture of the left humeral head -nondisplaced transverse fracture of the left patella.   Admitted to [facility] consulted 12/9, underwent ORIF of right humerus PT recommended CIR.  Subjective: Patient was seen and examined this morning.  Propped up in bed.  Not in distress.  Ready for CIR today.  I talked to her to avoid scheduled NSAIDs.  Since she is a Jehovah witness, I would suggest her to avoid any medicine that can increase GI bleeding.  Hospital course: Right humeral shaft fracture s/p ORIF 12//9 Left humeral neck fracture Left patella nondisplaced fracture Orthopedics follow-up appreciated. Nonsurgical management for left humerus neck and left patella  fracture. Weightbearing:  -NWB RUE and LUE.  WBAT LLE in hinged brace ROM:  -RUE - unrestricted ROM -LUE - maintain sling.  Avoid passive or active shoulder motion for the first 2 weeks. OK for unrestricted elbow/wrist motion.  -LLE - maintain hinged brace locked in extension  Continue pain control with scheduled Tylenol, oxycodone as needed, Robaxin as needed, Dilaudid IV as needed For patella fracture, continue playmaker brace 0 to 60 degrees to be worn when out of bed PT recommended CIR. At discharge, plan is to continue hydrocodone and Robaxin for pain control and aspirin 325 mg daily for 30 days.   Eyebrow laceration, right Eyebrow laceration was repaired by the ED provider, suture removal was recommended in 7 days   Normocytic anemia Jehovah witness Patient declines blood products, but reports being okay with expanders and iron infusions if needed. H&H somewhat downtrending.  Since she cannot receive blood transfusion, I would stop scheduled diclofenac.  I continue PPI as before Continue iron supplement Recent Labs    04/26/23 0623 04/27/23 0453 04/28/23 0537 04/29/23 0529 04/30/23 0514  HGB 11.0* 10.6* 10.1* 9.8* 9.3*  MCV 91.2 92.0 93.5 92.4 91.6   Essential hypertension BP controlled on losartan, hydrochlorothiazide Continue the same   Anxiety and depression Continue Wellbutrin XL   Hyperlipidemia Continue pravastatin  Allergies Continue Singulair and Flonase.   Goals of care   Code Status: Full Code   Diet:  Diet Order             Diet general           Diet regular Room service appropriate? Yes; Fluid consistency: Thin  Diet effective now                   Nutritional status:  Body mass index is 33.23 kg/m.       Wounds:  - Incision (Closed) 04/27/23 Arm Right (Active)  Date First Assessed/Time First Assessed: 04/27/23 1419   Location: Arm  Location Orientation: Right    Assessments 04/27/2023  2:30 PM 05/02/2023  8:00 AM  Dressing  Type Other (Comment) Gauze (Comment);Transparent dressing  Dressing Clean, Dry, Intact Clean, Dry, Intact  Site / Wound Assessment Dressing in place / Unable to assess Dressing in place / Unable to assess  Drainage Amount None None     No associated orders.    Discharge Exam:   Vitals:   05/01/23 1502 05/01/23 1939 05/02/23 0409 05/02/23 0837  BP: 126/81 129/64 (!) 155/78 (!) 175/95  Pulse: 95 95 76 85  Resp: 17 18 16 16   Temp: 98.4 F (36.9 C) 98.5 F (36.9 C) 98.5 F (36.9 C) 98.4 F (36.9 C)  TempSrc: Oral Oral Oral Oral  SpO2: 98% 97% 96% 97%  Weight:      Height:        Body mass index is 33.23 kg/m.  General exam: Pleasant, not in distress Skin: No rashes, lesions or ulcers. HEENT: Atraumatic, normocephalic, no obvious bleeding Lungs: Clear to auscultation bilaterally CVS: Regular rate and rhythm, no murmur GI/Abd soft, nontender, nondistended, bowel sound present CNS: Alert, awake, oriented x 3 Psychiatry: Mood appropriate Extremities: Left knee on immobilizer, left shoulder on sling  Follow ups:    Follow-up Information     Medicine, [facility].   Specialty: Family Medicine                Discharge Instructions:   Discharge Instructions     Call MD for:  difficulty breathing, headache or visual disturbances   Complete by: As directed    Call MD for:  extreme fatigue   Complete by: As directed    Call MD for:  hives   Complete by: As directed    Call MD for:  persistant dizziness or light-headedness   Complete by: As directed    Call MD for:  persistant nausea and vomiting   Complete by: As directed    Call MD for:  severe uncontrolled pain   Complete by: As directed    Call MD for:  temperature >100.4   Complete by: As directed    Diet general   Complete by: As directed    Discharge wound care:   Complete by: As directed    Increase activity slowly   Complete by: As directed        Discharge  Medications:   Allergies as of 05/02/2023       Reactions   Codeine    Morphine And Codeine    Sulfa Antibiotics         Medication List     STOP taking these medications    diclofenac 75 MG EC tablet Commonly known as: VOLTAREN       TAKE these medications    acetaminophen 500 MG tablet Commonly known as: TYLENOL Take 2 tablets (1,000 mg total) by mouth 3 (three) times daily.   bisacodyl 10 MG suppository Commonly known as: DULCOLAX Place 1 suppository (10 mg total) rectally daily as needed for moderate constipation.   buPROPion 300 MG 24 hr tablet Commonly known as: WELLBUTRIN XL Take 300 mg by mouth  daily.   Cholecalciferol 125 MCG (5000 UT) capsule Take 5,000 Units by mouth daily.   diphenhydrAMINE 12.5 MG/5ML elixir Commonly known as: BENADRYL Take 5-10 mLs (12.5-25 mg total) by mouth every 4 (four) hours as needed for itching.   escitalopram 20 MG tablet Commonly known as: LEXAPRO Take 20 mg by mouth daily.   esomeprazole 40 MG capsule Commonly known as: NEXIUM Take 40 mg by mouth daily.   ferrous sulfate 325 (65 FE) MG tablet Take 325 mg by mouth daily with breakfast.   fluticasone 50 MCG/ACT nasal spray Commonly known as: FLONASE Place 1 spray into both nostrils daily. Start taking on: May 03, 2023   ibuprofen 400 MG tablet Commonly known as: ADVIL Take 1 tablet (400 mg total) by mouth every 6 (six) hours as needed (pain not relieved with tylenol).   LORazepam 1 MG tablet Commonly known as: ATIVAN Take 1 mg by mouth 2 (two) times daily.   losartan-hydrochlorothiazide 50-12.5 MG tablet Commonly known as: HYZAAR TAKE 1 TABLET EVERY DAY   methocarbamol 500 MG tablet Commonly known as: ROBAXIN Take 1 tablet (500 mg total) by mouth every 6 (six) hours as needed for muscle spasms.   montelukast 10 MG tablet Commonly known as: SINGULAIR Take 10 mg by mouth at bedtime.   oxyCODONE 5 MG immediate release tablet Commonly known as:  Roxicodone Take 1 tablet (5 mg total) by mouth every 6 (six) hours as needed.   polyethylene glycol 17 g packet Commonly known as: MIRALAX / GLYCOLAX Take 17 g by mouth daily as needed.   pravastatin 20 MG tablet Commonly known as: PRAVACHOL Take 20 mg by mouth at bedtime.   senna-docusate 8.6-50 MG tablet Commonly known as: Senokot-S Take 2 tablets by mouth at bedtime.   thiamine 100 MG tablet Commonly known as: Vitamin B-1 Take 100 mg by mouth daily.               Discharge Care Instructions  (From admission, onward)           Start     Ordered   05/02/23 0000  Discharge wound care:        05/02/23 1610             The results of significant diagnostics from this hospitalization (including imaging, microbiology, ancillary and laboratory) are listed below for reference.    Procedures and Diagnostic Studies:   DG Humerus Right Result Date: 04/25/2023 CLINICAL DATA:  Fracture EXAM: RIGHT HUMERUS - 2+ VIEW COMPARISON:  04/25/2023 FINDINGS: Frontal and lateral views of the right humerus are obtained. Casting material obscures underlying bony detail. The oblique distal right humeral diaphyseal fracture is again noted, with near anatomic alignment after reduction. Stable chronic posttraumatic and degenerative changes of the right shoulder. There is diffuse soft tissue swelling. IMPRESSION: 1. Interval reduction of the right humeral diaphyseal fracture, with near anatomic alignment. Electronically Signed   By: Sharlet Salina M.D.   On: 04/25/2023 21:20   CT SHOULDER RIGHT WO CONTRAST Result Date: 04/25/2023 CLINICAL DATA:  Assess for fracture. EXAM: CT OF THE UPPER RIGHT EXTREMITY WITHOUT CONTRAST TECHNIQUE: Multidetector CT imaging of the upper right extremity was performed according to the standard protocol. RADIATION DOSE REDUCTION: This exam was performed according to the departmental dose-optimization program which includes automated exposure control, adjustment of  the mA and/or kV according to patient size and/or use of iterative reconstruction technique. COMPARISON:  Current right shoulder radiographs. FINDINGS: Bones/Joint/Cartilage No acute fracture. Significant deformity of  the right humeral head. Findings are suspected to be an old, healed fracture. There is secondary osteoarthritis with marked narrowing of the inferior glenohumeral joint space and glenoid marginal spurring. Humeral articular surface is irregular. Mild AC joint osteoarthritis.  AC joint normally aligned. No bone lesion. Ligaments Suboptimally assessed by CT. Muscles and Tendons No evidence of muscle injury or atrophy. Tendons are grossly intact. Soft tissues No soft tissue edema or hemorrhage.  No mass. IMPRESSION: 1. No acute fracture. 2. Significant deformity of the right humeral head, suspected to be an old, healed fracture. Secondary osteoarthritis of the glenohumeral joint. Electronically Signed   By: Amie Portland M.D.   On: 04/25/2023 18:05   CT Head Wo Contrast Result Date: 04/25/2023 CLINICAL DATA:  Head trauma, moderate-severe; Neck trauma (Age >= 65y) EXAM: CT HEAD WITHOUT CONTRAST CT CERVICAL SPINE WITHOUT CONTRAST TECHNIQUE: Multidetector CT imaging of the head and cervical spine was performed following the standard protocol without intravenous contrast. Multiplanar CT image reconstructions of the cervical spine were also generated. RADIATION DOSE REDUCTION: This exam was performed according to the departmental dose-optimization program which includes automated exposure control, adjustment of the mA and/or kV according to patient size and/or use of iterative reconstruction technique. COMPARISON:  None Available. FINDINGS: CT HEAD FINDINGS Brain: No hemorrhage. No hydrocephalus. No extra-axial fluid collection. No CT evidence of an acute cortical infarct. No mass effect. No mass lesion. Vascular: No hyperdense vessel or unexpected calcification. Skull: Normal. Negative for fracture or  focal lesion. Sinuses/Orbits: No middle ear or mastoid effusion. Paranasal sinuses are clear. Right lens replacement. Orbits are otherwise unremarkable. Other: None. CT CERVICAL SPINE FINDINGS Alignment: Normal. Skull base and vertebrae: Chronic compression deformity of the superior endplate of T1. 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 no acute cervical spine fracture Soft tissues and spinal canal: No prevertebral fluid or swelling. No visible canal hematoma. Disc levels:  No CT evidence of high-grade spinal canal stenosis. Upper chest: Negative. Other: None IMPRESSION: 1. No CT evidence of intracranial injury. 2. No acute cervical spine fracture. 3. Chronic compression deformity of the superior endplate of T1. Electronically Signed   By: Lorenza Cambridge M.D.   On: 04/25/2023 12:57   CT Cervical Spine Wo Contrast Result Date: 04/25/2023 CLINICAL DATA:  Head trauma, moderate-severe; Neck trauma (Age >= 65y) EXAM: CT HEAD WITHOUT CONTRAST CT CERVICAL SPINE WITHOUT CONTRAST TECHNIQUE: Multidetector CT imaging of the head and cervical spine was performed following the standard protocol without intravenous contrast. Multiplanar CT image reconstructions of the cervical spine were also generated. RADIATION DOSE REDUCTION: This exam was performed according to the departmental dose-optimization program which includes automated exposure control, adjustment of the mA and/or kV according to patient size and/or use of iterative reconstruction technique. COMPARISON:  None Available. FINDINGS: CT HEAD FINDINGS Brain: No hemorrhage. No hydrocephalus. No extra-axial fluid collection. No CT evidence of an acute cortical infarct. No mass effect. No mass lesion. Vascular: No hyperdense vessel or unexpected calcification. Skull: Normal. Negative for fracture or focal lesion. Sinuses/Orbits: No middle ear or mastoid effusion. Paranasal sinuses are clear. Right lens replacement. Orbits are otherwise unremarkable. Other: None. CT CERVICAL SPINE  FINDINGS Alignment: Normal. Skull base and vertebrae: Chronic compression deformity of the superior endplate of T1. 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 no acute cervical spine fracture Soft tissues and spinal canal: No prevertebral fluid or swelling. No visible canal hematoma. Disc levels:  No CT evidence of high-grade spinal canal stenosis. Upper chest: Negative. Other: None IMPRESSION: 1. No CT evidence of  intracranial injury. 2. No acute cervical spine fracture. 3. Chronic compression deformity of the superior endplate of T1. Electronically Signed   By: Lorenza Cambridge M.D.   On: 04/25/2023 12:57   DG Pelvis 1-2 Views Result Date: 04/25/2023 CLINICAL DATA:  Status post fall. Facial laceration with bilateral arm and knee pain. EXAM: LEFT KNEE - COMPLETE 4+ VIEW; PELVIS - 1-2 VIEW; RIGHT KNEE - COMPLETE 4+ VIEW; LEFT SHOULDER - 2+ VIEW; RIGHT SHOULDER - 2+ VIEW COMPARISON:  None relevant.  Concurrent chest radiographs. FINDINGS: Right shoulder: Right humeral radiographs are dictated separately. These better demonstrate an acute angulated and mildly comminuted fracture of the distal 3rd of the right humeral diaphysis. The right humeral head is abnormal with fragmentation, irregular sclerosis and possible anterior inferior dislocation. These findings are not favored to be acute and may reflect the sequela of an old or subacute injury. Correlate clinically. The right scapula appears intact. Left shoulder: There is a comminuted and mildly displaced acute fracture of the left humeral neck. The greater tuberosity is mildly displaced. Mild underlying glenohumeral and acromioclavicular degenerative changes. No dislocation. One-view pelvis: 1208 hours. The bones appear mildly demineralized. No evidence of acute fracture or dislocation. Mild degenerative changes of both hips and sacroiliac joints. Surgical clips overlie the right sacroiliac joint. Right knee: The mineralization and alignment are normal. There is no evidence of acute  fracture or dislocation. Mild tricompartmental degenerative changes. 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 No significant knee joint effusion or other focal soft tissue abnormality. Left knee: The mineralization and alignment are normal. There is suspicion of a nondisplaced transverse fracture of the patella, best seen on the externally rotated oblique view. This finding is not well seen in the other projections. No other evidence of acute fracture or dislocation. There are tricompartmental degenerative changes with meniscal chondrocalcinosis and a small knee joint effusion. IMPRESSION: 1. Acute angulated and mildly comminuted fracture of the distal 3rd of the right humeral diaphysis. See separate report for right humerus. 2. Suspected chronic fracture of the right humeral head with fragmentation and possible anterior inferior dislocation. 3. Comminuted and mildly displaced acute fracture of the left humeral neck. 4. No evidence of acute pelvic fracture. 5. Suspected nondisplaced transverse fracture of the left patella. Correlate clinically. No other evidence of acute fracture or dislocation in either knee. 6. 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 Electronically Signed   By: Carey Bullocks M.D.   On: 04/25/2023 12:56   DG Shoulder Right Result Date: 04/25/2023 CLINICAL DATA:  Status post fall. Facial laceration with bilateral arm and knee pain. EXAM: LEFT KNEE - COMPLETE 4+ VIEW; PELVIS - 1-2 VIEW; RIGHT KNEE - COMPLETE 4+ VIEW; LEFT SHOULDER - 2+ VIEW; RIGHT SHOULDER - 2+ VIEW COMPARISON:  None relevant.  Concurrent chest radiographs. FINDINGS: Right shoulder: Right humeral radiographs are dictated separately. These better demonstrate an acute angulated and mildly comminuted fracture of the distal 3rd of the right humeral diaphysis. The right humeral head is abnormal with fragmentation, irregular sclerosis and possible anterior inferior dislocation. These findings are not favored  to be acute and may reflect the sequela of an old or subacute injury. Correlate clinically. The right scapula appears intact. Left shoulder: There is a comminuted and mildly displaced acute fracture of the left humeral neck. The greater tuberosity is mildly displaced. Mild underlying glenohumeral and acromioclavicular degenerative changes. No dislocation. One-view pelvis: 1208 hours. The bones appear mildly demineralized. No evidence of acute fracture or dislocation. Mild degenerative changes of both hips  and sacroiliac joints. Surgical clips overlie the right sacroiliac joint. Right knee: The mineralization and alignment are normal. There is no evidence of acute fracture or dislocation. Mild tricompartmental degenerative changes. 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 No significant knee joint effusion or other focal soft tissue abnormality. Left knee: The mineralization and alignment are normal. There is suspicion of a nondisplaced transverse fracture of the patella, best seen on the externally rotated oblique view. This finding is not well seen in the other projections. No other evidence of acute fracture or dislocation. There are tricompartmental degenerative changes with meniscal chondrocalcinosis and a small knee joint effusion. IMPRESSION: 1. Acute angulated and mildly comminuted fracture of the distal 3rd of the right humeral diaphysis. See separate report for right humerus. 2. Suspected chronic fracture of the right humeral head with fragmentation and possible anterior inferior dislocation. 3. Comminuted and mildly displaced acute fracture of the left humeral neck. 4. No evidence of acute pelvic fracture. 5. Suspected nondisplaced transverse fracture of the left patella. Correlate clinically. No other evidence of acute fracture or dislocation in either knee. 6. 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 Electronically Signed   By: Carey Bullocks M.D.   On: 04/25/2023 12:56   DG  Shoulder Left Result Date: 04/25/2023 CLINICAL DATA:  Status post fall. Facial laceration with bilateral arm and knee pain. EXAM: LEFT KNEE - COMPLETE 4+ VIEW; PELVIS - 1-2 VIEW; RIGHT KNEE - COMPLETE 4+ VIEW; LEFT SHOULDER - 2+ VIEW; RIGHT SHOULDER - 2+ VIEW COMPARISON:  None relevant.  Concurrent chest radiographs. FINDINGS: Right shoulder: Right humeral radiographs are dictated separately. These better demonstrate an acute angulated and mildly comminuted fracture of the distal 3rd of the right humeral diaphysis. The right humeral head is abnormal with fragmentation, irregular sclerosis and possible anterior inferior dislocation. These findings are not favored to be acute and may reflect the sequela of an old or subacute injury. Correlate clinically. The right scapula appears intact. Left shoulder: There is a comminuted and mildly displaced acute fracture of the left humeral neck. The greater tuberosity is mildly displaced. Mild underlying glenohumeral and acromioclavicular degenerative changes. No dislocation. One-view pelvis: 1208 hours. The bones appear mildly demineralized. No evidence of acute fracture or dislocation. Mild degenerative changes of both hips and sacroiliac joints. Surgical clips overlie the right sacroiliac joint. Right knee: The mineralization and alignment are normal. There is no evidence of acute fracture or dislocation. Mild tricompartmental degenerative changes. 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 No significant knee joint effusion or other focal soft tissue abnormality. Left knee: The mineralization and alignment are normal. There is suspicion of a nondisplaced transverse fracture of the patella, best seen on the externally rotated oblique view. This finding is not well seen in the other projections. No other evidence of acute fracture or dislocation. There are tricompartmental degenerative changes with meniscal chondrocalcinosis and a small knee joint effusion. IMPRESSION: 1. Acute  angulated and mildly comminuted fracture of the distal 3rd of the right humeral diaphysis. See separate report for right humerus. 2. Suspected chronic fracture of the right humeral head with fragmentation and possible anterior inferior dislocation. 3. Comminuted and mildly displaced acute fracture of the left humeral neck. 4. No evidence of acute pelvic fracture. 5. Suspected nondisplaced transverse fracture of the left patella. Correlate clinically. No other evidence of acute fracture or dislocation in either knee. 6. 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 Electronically Signed   By: Carey Bullocks M.D.   On: 04/25/2023 12:56   DG Knee Complete  4 Views Right Result Date: 04/25/2023 CLINICAL DATA:  Status post fall. Facial laceration with bilateral arm and knee pain. EXAM: LEFT KNEE - COMPLETE 4+ VIEW; PELVIS - 1-2 VIEW; RIGHT KNEE - COMPLETE 4+ VIEW; LEFT SHOULDER - 2+ VIEW; RIGHT SHOULDER - 2+ VIEW COMPARISON:  None relevant.  Concurrent chest radiographs. FINDINGS: Right shoulder: Right humeral radiographs are dictated separately. These better demonstrate an acute angulated and mildly comminuted fracture of the distal 3rd of the right humeral diaphysis. The right humeral head is abnormal with fragmentation, irregular sclerosis and possible anterior inferior dislocation. These findings are not favored to be acute and may reflect the sequela of an old or subacute injury. Correlate clinically. The right scapula appears intact. Left shoulder: There is a comminuted and mildly displaced acute fracture of the left humeral neck. The greater tuberosity is mildly displaced. Mild underlying glenohumeral and acromioclavicular degenerative changes. No dislocation. One-view pelvis: 1208 hours. The bones appear mildly demineralized. No evidence of acute fracture or dislocation. Mild degenerative changes of both hips and sacroiliac joints. Surgical clips overlie the right sacroiliac joint. Right  knee: The mineralization and alignment are normal. There is no evidence of acute fracture or dislocation. Mild tricompartmental degenerative changes. 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 No significant knee joint effusion or other focal soft tissue abnormality. Left knee: The mineralization and alignment are normal. There is suspicion of a nondisplaced transverse fracture of the patella, best seen on the externally rotated oblique view. This finding is not well seen in the other projections. No other evidence of acute fracture or dislocation. There are tricompartmental degenerative changes with meniscal chondrocalcinosis and a small knee joint effusion. IMPRESSION: 1. Acute angulated and mildly comminuted fracture of the distal 3rd of the right humeral diaphysis. See separate report for right humerus. 2. Suspected chronic fracture of the right humeral head with fragmentation and possible anterior inferior dislocation. 3. Comminuted and mildly displaced acute fracture of the left humeral neck. 4. No evidence of acute pelvic fracture. 5. Suspected nondisplaced transverse fracture of the left patella. Correlate clinically. No other evidence of acute fracture or dislocation in either knee. 6. 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 Electronically Signed   By: Carey Bullocks M.D.   On: 04/25/2023 12:56   DG Knee Complete 4 Views Left Result Date: 04/25/2023 CLINICAL DATA:  Status post fall. Facial laceration with bilateral arm and knee pain. EXAM: LEFT KNEE - COMPLETE 4+ VIEW; PELVIS - 1-2 VIEW; RIGHT KNEE - COMPLETE 4+ VIEW; LEFT SHOULDER - 2+ VIEW; RIGHT SHOULDER - 2+ VIEW COMPARISON:  None relevant.  Concurrent chest radiographs. FINDINGS: Right shoulder: Right humeral radiographs are dictated separately. These better demonstrate an acute angulated and mildly comminuted fracture of the distal 3rd of the right humeral diaphysis. The right humeral head is abnormal with fragmentation,  irregular sclerosis and possible anterior inferior dislocation. These findings are not favored to be acute and may reflect the sequela of an old or subacute injury. Correlate clinically. The right scapula appears intact. Left shoulder: There is a comminuted and mildly displaced acute fracture of the left humeral neck. The greater tuberosity is mildly displaced. Mild underlying glenohumeral and acromioclavicular degenerative changes. No dislocation. One-view pelvis: 1208 hours. The bones appear mildly demineralized. No evidence of acute fracture or dislocation. Mild degenerative changes of both hips and sacroiliac joints. Surgical clips overlie the right sacroiliac joint. Right knee: The mineralization and alignment are normal. There is no evidence of acute fracture or dislocation. Mild tricompartmental degenerative changes. There  is meniscal chondrocalcinosis. No significant knee joint effusion or other focal soft tissue abnormality. Left knee: The mineralization and alignment are normal. There is suspicion of a nondisplaced transverse fracture of the patella, best seen on the externally rotated oblique view. This finding is not well seen in the other projections. No other evidence of acute fracture or dislocation. There are tricompartmental degenerative changes with meniscal chondrocalcinosis and a small knee joint effusion. IMPRESSION: 1. Acute angulated and mildly comminuted fracture of the distal 3rd of the right humeral diaphysis. See separate report for right humerus. 2. Suspected chronic fracture of the right humeral head with fragmentation and possible anterior inferior dislocation. 3. Comminuted and mildly displaced acute fracture of the left humeral neck. 4. No evidence of acute pelvic fracture. 5. Suspected nondisplaced transverse fracture of the left patella. Correlate clinically. No other evidence of acute fracture or dislocation in either knee. 6. Bilateral knee degenerative changes with meniscal  chondrocalcinosis. Electronically Signed   By: Carey Bullocks M.D.   On: 04/25/2023 12:56   DG Humerus Right Result Date: 04/25/2023 CLINICAL DATA:  Status post fall. EXAM: RIGHT HUMERUS - 2+ VIEW COMPARISON:  None Available. FINDINGS: There is an acute fracture deformity involving the distal diaphysis of the right humerus. There is proximal and medial displacement with medial angulation of the distal fracture fragments. Severe degenerative changes identified within the glenohumeral joint. IMPRESSION: Acute fracture deformity involves the distal diaphysis of the right humerus. Electronically Signed   By: Signa Kell M.D.   On: 04/25/2023 12:53   DG Chest Portable 1 View Result Date: 04/25/2023 CLINICAL DATA:  Larey Seat at home. EXAM: PORTABLE CHEST 1 VIEW COMPARISON:  None Available. FINDINGS: Cardiac silhouette borderline enlarged. No mediastinal or hilar masses. Clear lungs. No gross pneumothorax or pleural effusion on this supine exam. Right glenohumeral joint space narrowing. Questionable fracture or chronic findings of the right humeral head. This will be further evaluated with dedicated radiographs. IMPRESSION: 1. No acute cardiopulmonary disease. Electronically Signed   By: Amie Portland M.D.   On: 04/25/2023 12:50     Labs:   Basic Metabolic Panel: Recent Labs  Lab 04/26/23 0623 04/27/23 0453 04/28/23 0537 04/29/23 0529 04/30/23 0514  NA 135 135 136 133* 132*  K 3.9 3.6 3.8 3.7 3.7  CL 102 103 104 103 100  CO2 24 23 24 22 24   GLUCOSE 123* 111* 111* 110* 112*  BUN 21 17 18 21  25*  CREATININE 1.03* 0.88 1.01* 0.73 0.84  CALCIUM 9.1 8.7* 8.7* 9.0 9.2   GFR Estimated Creatinine Clearance: 74.8 mL/min (by C-G formula based on SCr of 0.84 mg/dL). Liver Function Tests: No results for input(s): "AST", "ALT", "ALKPHOS", "BILITOT", "PROT", "ALBUMIN" in the last 168 hours. No results for input(s): "LIPASE", "AMYLASE" in the last 168 hours. No results for input(s): "AMMONIA" in the last  168 hours. Coagulation profile Recent Labs  Lab 04/25/23 1245  INR 1.0    CBC: Recent Labs  Lab 04/25/23 1245 04/26/23 0623 04/27/23 0453 04/28/23 0537 04/29/23 0529 04/30/23 0514  WBC 12.5* 9.7 9.5 10.1 8.7 9.1  NEUTROABS 9.9*  --   --   --   --  5.5  HGB 11.9* 11.0* 10.6* 10.1* 9.8* 9.3*  HCT 36.1 33.3* 32.3* 31.4* 30.2* 28.4*  MCV 90.9 91.2 92.0 93.5 92.4 91.6  PLT 314 281 245 232 233 297   Cardiac Enzymes: Recent Labs  Lab 04/26/23 0623  CKTOTAL 128   BNP: Invalid input(s): "POCBNP" CBG: No results for  input(s): "GLUCAP" in the last 168 hours. D-Dimer No results for input(s): "DDIMER" in the last 72 hours. Hgb A1c No results for input(s): "HGBA1C" in the last 72 hours. Lipid Profile No results for input(s): "CHOL", "HDL", "LDLCALC", "TRIG", "CHOLHDL", "LDLDIRECT" in the last 72 hours. Thyroid function studies No results for input(s): "TSH", "T4TOTAL", "T3FREE", "THYROIDAB" in the last 72 hours.  Invalid input(s): "FREET3" Anemia work up No results for input(s): "VITAMINB12", "FOLATE", "FERRITIN", "TIBC", "IRON", "RETICCTPCT" in the last 72 hours. Microbiology Recent Results (from the past 240 hours)  Surgical pcr screen     Status: None   Collection Time: 04/26/23 10:28 PM   Specimen: Nasal Mucosa; Nasal Swab  Result Value Ref Range Status   MRSA, PCR NEGATIVE NEGATIVE Final   Staphylococcus aureus NEGATIVE NEGATIVE Final    Comment: (NOTE) The Xpert SA Assay (FDA approved for NASAL specimens in patients 22 years of age and older), is one component of a comprehensive surveillance program. It is not intended to diagnose infection nor to guide or monitor treatment. Performed at [facility], [address] [place], [place] [address]     Time coordinating discharge: 45 minutes  Signed: Shaelynn Dragos  [facility] 05/02/2023, 10:31 AM

## 2023-05-02 NOTE — Progress Notes (Signed)
Inpatient Rehab Admissions Coordinator:  There is a bed available for pt in CIR today. Dr. Pola Corn aware and in agreement. Pt, NSG and TOC made aware.   Wolfgang Phoenix, MS, CCC-SLP Admissions Coordinator [phone]

## 2023-05-02 NOTE — H&P (Signed)
Physical Medicine and Rehabilitation Admission H&P        Chief Complaint  Patient presents with   Fall  : HPI: Morgan Potter is a 72 year old left-handed female with past medical history of hypertension, anxiety, GERD, hyperlipidemia.  Per chart review patient lives with her family including 4 great-grandchildren from  21-year-old to a  32 year old.  Mobile home with 2 steps to entry.  Independent prior to admission.  She is Jehovah witness.  Presented 04/25/2023 after a fall when she was rushing out of her gait with a coffee in her hand when she tripped over a pot and fell on her face.  She denied loss of consciousness but was unable to get up and remained on the ground for approximately 15 minutes.  Admission chemistries unremarkable except WBC 12,500, CK1 28.  CT of the brain as well as cervical spine negative.  X-rays and imaging revealed acute fracture involving the distal diaphysis of the right humerus, comminuted mildly displaced acute fracture of the left humeral head and nondisplaced transverse fracture left patella.  Underwent open reduction trial fixation of right humeral shaft fracture 04/27/2023 per Dr. Jena Gauss..  Nonoperative left upper extremity placed in a sling as well as left lower extremity knee brace.  Nonweightbearing to right upper extremity with shoulder sling immobilizer at all times off for dressing bathing exercises as well as nonweightbearing to left upper extremity.  Weightbearing as tolerated to left lower extremity.  Acute blood loss anemia 9.3 and monitored.  Mild hyponatremia 132 and monitored.  Placed on Lovenox for DVT prophylaxis.  Therapy evaluations completed due to patient decreased functional mobility was admitted for a comprehensive rehab program.     Pt reports LBM yesterday- could go again today, but concerned/embarrassed since has female nurse.  Pain barely there at rest, but when moves, gets OOB, pain goes to 4-5/10.  Is tired- has been using Purewick, but is  spilling and getting her wet.  Sleeping OK.        Review of Systems  Constitutional:  Negative for chills and fever.  HENT:  Negative for hearing loss.   Eyes:  Negative for blurred vision and double vision.  Respiratory:  Negative for cough.        Shortness of breath with heavy exertion  Cardiovascular:  Negative for chest pain, palpitations and leg swelling.  Gastrointestinal:  Negative for abdominal pain, constipation, heartburn, nausea and vomiting.  Genitourinary:  Negative for dysuria, flank pain and hematuria.  Musculoskeletal:  Positive for joint pain and myalgias.  Skin:  Negative for rash.  Neurological:  Positive for dizziness.       Lightheadedness  Psychiatric/Behavioral:  Positive for depression.        Anxiety  All other systems reviewed and are negative.       Past Medical History:  Diagnosis Date   Dizziness and giddiness     Dyspnea     Fatigue     Lightheadedness     Palpitations     SOB (shortness of breath)     Weakness               Past Surgical History:  Procedure Laterality Date   APPENDECTOMY   1976   CESAREAN SECTION   1981   CESAREAN SECTION   1969   cholecystectomy   1976   ORIF HUMERUS FRACTURE Right 04/27/2023    Procedure: OPEN REDUCTION INTERNAL FIXATION (ORIF) HUMERAL SHAFT FRACTURE;  Surgeon: Roby Lofts, MD;  Location: [facility]  OR;  Service: Orthopedics;  Laterality: Right;             Family History  Problem Relation Age of Onset   Arrhythmia Mother     Heart attack Father     Cancer Father          Social History:  reports that she has never smoked. She has never used smokeless tobacco. No history on file for alcohol use and drug use. Allergies:  Allergies      Allergies  Allergen Reactions   Codeine     Morphine And Codeine     Sulfa Antibiotics              Medications Prior to Admission  Medication Sig Dispense Refill   buPROPion (WELLBUTRIN XL) 300 MG 24 hr tablet Take 300 mg by mouth daily.        Cholecalciferol 125 MCG (5000 UT) capsule Take 5,000 Units by mouth daily.       [EXPIRED] diclofenac (VOLTAREN) 75 MG EC tablet Take 75 mg by mouth 2 (two) times daily.       escitalopram (LEXAPRO) 20 MG tablet Take 20 mg by mouth daily.       esomeprazole (NEXIUM) 40 MG capsule Take 40 mg by mouth daily.       ferrous sulfate 325 (65 FE) MG tablet Take 325 mg by mouth daily with breakfast.       LORazepam (ATIVAN) 1 MG tablet Take 1 mg by mouth 2 (two) times daily.       losartan-hydrochlorothiazide (HYZAAR) 50-12.5 MG tablet TAKE 1 TABLET EVERY DAY       montelukast (SINGULAIR) 10 MG tablet Take 10 mg by mouth at bedtime.       pravastatin (PRAVACHOL) 20 MG tablet Take 20 mg by mouth at bedtime.       thiamine (VITAMIN B-1) 100 MG tablet Take 100 mg by mouth daily.                  Home: Home Living Family/patient expects to be discharged to:: Private residence Living Arrangements: Children, Other (Comment) (4 great grandchildren 75 y/o-15 y/o) Available Help at Discharge: Family Type of Home: House (mobile home in front and built onto in the back) Home Access: Stairs to enter Secretary/administrator of Steps: 2 Entrance Stairs-Rails: None Home Layout: One level Bathroom Shower/Tub: Health visitor: Handicapped height Bathroom Accessibility: Yes Home Equipment: Information systems manager Additional Comments: possilby has wc   Functional History: Prior Function Prior Level of Function : Independent/Modified Independent, Driving   Functional Status:  Mobility: Bed Mobility Overal bed mobility: Needs Assistance Bed Mobility: Supine to Sit Supine to sit: Contact guard General bed mobility comments: HOB elevated, cues for weightbearing precautions Transfers Overall transfer level: Needs assistance Equipment used: None Transfers: Sit to/from Stand Sit to Stand: Mod assist Bed to/from chair/wheelchair/BSC transfer type:: Step pivot Step pivot transfers: Min assist, +2  physical assistance General transfer comment: Heavy modA to transition to standing from edge of bed, increased time to rise, cues to not use her RUE to push up Ambulation/Gait Ambulation/Gait assistance: Min assist, +2 safety/equipment Gait Distance (Feet): 80 Feet Assistive device: None Gait Pattern/deviations: Step-through pattern, Decreased stance time - left, Antalgic, Decreased weight shift to left General Gait Details: MinA for dynamic balance, antalgic gait pattern Gait velocity: decreased Gait velocity interpretation: <1.31 ft/sec, indicative of household ambulator   ADL: ADL Overall ADL's : Needs assistance/impaired Eating/Feeding: Supervision/ safety, Bed level Eating/Feeding Details (  indicate cue type and reason): pt able to self feed with L hand this morning per report; discussed use of 'sticky' foods for ease/decreased spillage and attempts to use R UE (althought L hand dominent).  She is able to reach to table to retrieve cup with R hand, encouraged only filling up styrofoam cup 1/2 way due to weight Grooming: Sitting, Minimal assistance Upper Body Dressing : Total assistance, Sitting Lower Body Dressing: Total assistance, +2 for physical assistance, Sit to/from stand Toilet Transfer: Maximal assistance, +2 for physical assistance, Stand-pivot Toileting- Clothing Manipulation and Hygiene: Total assistance, +2 for physical assistance, Sit to/from stand Functional mobility during ADLs: +2 for physical assistance, Maximal assistance, Cueing for safety General ADL Comments: focused on UE exercises today   Cognition: Cognition Overall Cognitive Status: Within Functional Limits for tasks assessed Orientation Level: Oriented X4 Cognition Arousal: Alert Behavior During Therapy: Anxious Overall Cognitive Status: Within Functional Limits for tasks assessed   Physical Exam: Blood pressure 121/68, pulse 96, temperature 97.9 F (36.6 C), temperature source Oral, resp. rate 18,  height 5\' 9"  (1.753 m), weight 102.1 kg, SpO2 95%. Physical Exam Vitals and nursing note reviewed.  Constitutional:      Appearance: Normal appearance. She is obese.     Comments: Asleep initially, woke to verbal stimuli- sitting up in bed; wearing sling on LUE, awake, alert, appropriate afterwards, NAD  HENT:     Head: Normocephalic and atraumatic.     Comments: A little facial bruising- no facial asymmetry    Right Ear: External ear normal.     Left Ear: External ear normal.     Nose: Nose normal. No congestion.     Mouth/Throat:     Mouth: Mucous membranes are dry.     Pharynx: Oropharynx is clear. No oropharyngeal exudate.  Eyes:     General:        Right eye: No discharge.        Left eye: No discharge.     Extraocular Movements: Extraocular movements intact.  Cardiovascular:     Rate and Rhythm: Normal rate and regular rhythm.     Heart sounds: Normal heart sounds. No murmur heard.    No gallop.     Comments: A lot of PVCs on tele.  RRR on exam Pulmonary:     Effort: Pulmonary effort is normal. No respiratory distress.     Breath sounds: Normal breath sounds. No wheezing, rhonchi or rales.  Abdominal:     General: Bowel sounds are normal. There is no distension.     Palpations: Abdomen is soft.     Tenderness: There is no abdominal tenderness.  Genitourinary:    Comments: Purewick in place- cannister almost full Musculoskeletal:     Cervical back: Neck supple. No tenderness.     Comments: Cannot test deltoid or biceps B/L- due to humeral fx's or triceps WE/Grip and FA 5-/5 B/L  LE's- HF 4+/5; KE on R 4+/5; NT on L due to KI; DF/PF 5/5 B/L Wearing sling on LUE- because not allowed to move L shoulder at all- and KI on LLE  Skin:    General: Skin is warm and dry.     Comments: Extensive purple/yellow bruising on B/L Ue's Dressing R bicep- down entire bicep- looks C/D/I KI on LLE-   Neurological:     Mental Status: She is alert.     Comments: Patient is alert no  acute distress oriented x 3 and follows commands. Intact to light touch in all  4 extremities  Psychiatric:        Mood and Affect: Mood normal.        Behavior: Behavior normal.     Comments: A little anxious about great grandkids        Lab Results Last 48 Hours        Results for orders placed or performed during the hospital encounter of 04/25/23 (from the past 48 hours)  CBC     Status: Abnormal    Collection Time: 04/29/23  5:29 AM  Result Value Ref Range    WBC 8.7 4.0 - 10.5 K/uL    RBC 3.27 (L) 3.87 - 5.11 MIL/uL    Hemoglobin 9.8 (L) 12.0 - 15.0 g/dL    HCT 40.9 (L) 81.1 - 46.0 %    MCV 92.4 80.0 - 100.0 fL    MCH 30.0 26.0 - 34.0 pg    MCHC 32.5 30.0 - 36.0 g/dL    RDW 91.4 78.2 - 95.6 %    Platelets 233 150 - 400 K/uL    nRBC 0.0 0.0 - 0.2 %      Comment: Performed at [facility], [address], [place], [place] [address]  Basic metabolic panel     Status: Abnormal    Collection Time: 04/29/23  5:29 AM  Result Value Ref Range    Sodium 133 (L) 135 - 145 mmol/L    Potassium 3.7 3.5 - 5.1 mmol/L    Chloride 103 98 - 111 mmol/L    CO2 22 22 - 32 mmol/L    Glucose, Bld 110 (H) 70 - 99 mg/dL      Comment: Glucose reference range applies only to samples taken after fasting for at least 8 hours.    BUN 21 8 - 23 mg/dL    Creatinine, Ser 6.57 0.44 - 1.00 mg/dL    Calcium 9.0 8.9 - 84.6 mg/dL    GFR, Estimated >96 >29 mL/min      Comment: (NOTE) Calculated using the CKD-EPI Creatinine Equation (2021)      Anion gap 8 5 - 15      Comment: Performed at [facility], [address] [place], [place] [address]  CBC with Differential/Platelet     Status: Abnormal    Collection Time: 04/30/23  5:14 AM  Result Value Ref Range    WBC 9.1 4.0 - 10.5 K/uL    RBC 3.10 (L) 3.87 - 5.11 MIL/uL    Hemoglobin 9.3 (L) 12.0 - 15.0 g/dL    HCT 32.4 (L) 40.1 - 46.0 %    MCV 91.6 80.0 - 100.0 fL    MCH 30.0 26.0 - 34.0 pg    MCHC 32.7 30.0 - 36.0 g/dL    RDW 02.7 25.3  - 66.4 %    Platelets 297 150 - 400 K/uL    nRBC 0.0 0.0 - 0.2 %    Neutrophils Relative % 59 %    Neutro Abs 5.5 1.7 - 7.7 K/uL    Lymphocytes Relative 25 %    Lymphs Abs 2.3 0.7 - 4.0 K/uL    Monocytes Relative 11 %    Monocytes Absolute 1.0 0.1 - 1.0 K/uL    Eosinophils Relative 3 %    Eosinophils Absolute 0.3 0.0 - 0.5 K/uL    Basophils Relative 1 %    Basophils Absolute 0.1 0.0 - 0.1 K/uL    Immature Granulocytes 1 %    Abs Immature Granulocytes 0.08 (H) 0.00 -  0.07 K/uL      Comment: Performed at [facility], [address], [place], [place] [address]  Basic metabolic panel     Status: Abnormal    Collection Time: 04/30/23  5:14 AM  Result Value Ref Range    Sodium 132 (L) 135 - 145 mmol/L    Potassium 3.7 3.5 - 5.1 mmol/L    Chloride 100 98 - 111 mmol/L    CO2 24 22 - 32 mmol/L    Glucose, Bld 112 (H) 70 - 99 mg/dL      Comment: Glucose reference range applies only to samples taken after fasting for at least 8 hours.    BUN 25 (H) 8 - 23 mg/dL    Creatinine, Ser 6.57 0.44 - 1.00 mg/dL    Calcium 9.2 8.9 - 84.6 mg/dL    GFR, Estimated >96 >29 mL/min      Comment: (NOTE) Calculated using the CKD-EPI Creatinine Equation (2021)      Anion gap 8 5 - 15      Comment: Performed at [facility], [address] [place], [place] [address]      Imaging Results (Last 48 hours)  No results found.         Blood pressure 121/68, pulse 96, temperature 97.9 F (36.6 C), temperature source Oral, resp. rate 18, height 5\' 9"  (1.753 m), weight 102.1 kg, SpO2 95%.   Medical Problem List and Plan: 1. Functional deficits secondary to polytrauma after fall 2012 12/06/2022             -patient may  shower- cover KI and her incisions and L sling- if can take shower, then able to             -ELOS/Goals: 14-16 days- min A             Admit to CIR             NWB B/L UE"s- Sling in LUE with no Shoulder ROM for 2 weeks at all RUE- ROM as tolerated             KI with WBAT on  LLE 2.  Antithrombotics: -DVT/anticoagulation:  Pharmaceutical: Lovenox check vascular study             -antiplatelet therapy: N/A 3. Pain Management: Voltaren 75 mg p.o. twice daily Robaxin and hydrocodone as needed, Advil 400 mg every 6 hours as needed 4. Mood/Behavior/Sleep: Lexapro 20 mg daily, Wellbutrin 300 mg daily, Ativan 1 mg twice daily as needed             -antipsychotic agents: N/A 5. Neuropsych/cognition: This patient is capable of making decisions on her own behalf. 6. Skin/Wound Care: Routine skin checks 7. Fluids/Electrolytes/Nutrition: Routine in and outs with follow-up chemistries 8.  Fracture involving the distal diaphysis of the right humerus.  Status post ORIF of humeral shaft fracture 04/27/2023.  Nonweightbearing 9.  Comminuted mildly displaced fracture of the left humeral head.  Nonoperative.  Nonweightbearing.  Shoulder sling at all times. 10.  Nondisplaced transverse fracture left patella.  Weightbearing as tolerated..  Knee immobilizer at all times 11.  Acute blood loss anemia.  Follow-up CBC.  Continue iron supplement 12.  Hypertension.  HCTZ 12.5 mg daily, Cozaar 50 mg daily.  Monitor with increased mobility 13.  Constipation.  Colace 100 mg twice daily, MiraLAX daily.  Dulcolax suppository as needed 14.  Hyperlipidemia.  Pravachol 15.  GERD.  Protonix     Reuel Boom  J Angiulli, PA-C 05/01/2023     I have personally performed a face to face diagnostic evaluation of this patient and formulated the key components of the plan.  Additionally, I have personally reviewed laboratory data, imaging studies, as well as relevant notes and concur with the physician assistant's documentation above.   The patient's status has not changed from the original H&P.  Any changes in documentation from the acute care chart have been noted above.

## 2023-05-02 NOTE — Progress Notes (Signed)
Patient transferred to 4W-04.  Called and gave report to nurse Loura Halt.  Questions answered.    Transported patient in bed.

## 2023-05-02 NOTE — Progress Notes (Signed)
Signed     Expand All Collapse All          Physical Medicine and Rehabilitation Consult Reason for Consult: Polytrauma Referring Physician: Seabron Spates, MD     HPI: Morgan Potter is a 72 y.o. female who is 2 days post op from open reduction and internal fixation of right humeral shaft fracture. She also has left proximal humerus fracture and left patellar fracture. She has been wearing hinged knee brace and is NWB is b/l upper extremities. Pain is currently being managed with Dilaudid, hydrocodone, Robaxin, Advil, and Tylenol. Physical Medicine & Rehabilitation was consulted to assess candidacy for CIR.       ROS +postoperative pain     Past Medical History:  Diagnosis Date   Dizziness and giddiness     Dyspnea     Fatigue     Lightheadedness     Palpitations     SOB (shortness of breath)     Weakness               Past Surgical History:  Procedure Laterality Date   APPENDECTOMY   1976   CESAREAN SECTION   1981   CESAREAN SECTION   1969   cholecystectomy   1976   ORIF HUMERUS FRACTURE Right 04/27/2023    Procedure: OPEN REDUCTION INTERNAL FIXATION (ORIF) HUMERAL SHAFT FRACTURE;  Surgeon: Roby Lofts, MD;  Location: [facility] OR;  Service: Orthopedics;  Laterality: Right;             Family History  Problem Relation Age of Onset   Arrhythmia Mother     Heart attack Father     Cancer Father          Social History:  reports that she has never smoked. She has never used smokeless tobacco. No history on file for alcohol use and drug use. Allergies:  Allergies      Allergies  Allergen Reactions   Codeine     Morphine And Codeine     Sulfa Antibiotics              Medications Prior to Admission  Medication Sig Dispense Refill   buPROPion (WELLBUTRIN XL) 300 MG 24 hr tablet Take 300 mg by mouth daily.       Cholecalciferol 125 MCG (5000 UT) capsule Take 5,000 Units by mouth daily.       diclofenac (VOLTAREN) 75 MG EC tablet Take 75 mg by mouth 2 (two) times  daily.       escitalopram (LEXAPRO) 20 MG tablet Take 20 mg by mouth daily.       esomeprazole (NEXIUM) 40 MG capsule Take 40 mg by mouth daily.       ferrous sulfate 325 (65 FE) MG tablet Take 325 mg by mouth daily with breakfast.       LORazepam (ATIVAN) 1 MG tablet Take 1 mg by mouth 2 (two) times daily.       losartan-hydrochlorothiazide (HYZAAR) 50-12.5 MG tablet TAKE 1 TABLET EVERY DAY       montelukast (SINGULAIR) 10 MG tablet Take 10 mg by mouth at bedtime.       pravastatin (PRAVACHOL) 20 MG tablet Take 20 mg by mouth at bedtime.       thiamine (VITAMIN B-1) 100 MG tablet Take 100 mg by mouth daily.              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: Information systems manager Additional Comments: possilby has wc  Functional History: Prior Function Prior Level of Function : Independent/Modified Independent, Driving Functional Status:  Mobility: Bed Mobility Overal bed mobility: Needs Assistance Bed Mobility: Supine to Sit Supine to sit: Min assist General bed mobility comments: HOB elevated, cueing for technique Transfers Overall transfer level: Needs assistance Equipment used: None Transfers: Sit to/from Stand, Bed to chair/wheelchair/BSC Sit to Stand: Mod assist, Max assist, +2 physical assistance Bed to/from chair/wheelchair/BSC transfer type:: Step pivot Step pivot transfers: Min assist, +2 physical assistance General transfer comment: cueing for technique, kicking L LE out due to discomfort; mod-max assist +2 to power up from EOB, recliner and BSC  (easier from slightly elevated EOB) with min assist +2 to step to surface/   ADL: ADL Overall ADL's : Needs assistance/impaired Grooming: Sitting, Minimal assistance Upper Body Dressing : Total assistance, Sitting Lower Body Dressing: Total assistance, +2 for physical assistance, Sit to/from stand Toilet Transfer: Maximal assistance, +2 for physical assistance, Stand-pivot Toileting- Clothing Manipulation and Hygiene: Total assistance, +2 for physical assistance, Sit to/from stand Functional mobility during ADLs: +2 for physical assistance, Maximal assistance, Cueing for safety   Cognition: Cognition Overall Cognitive Status: Within Functional Limits for tasks assessed Orientation Level: Oriented X4 Cognition Arousal: Alert Behavior During Therapy: Anxious Overall Cognitive Status: Within Functional Limits for tasks assessed   Blood pressure (!) 155/86, pulse 84, temperature 98.6 F (37 C), temperature source Oral, resp. rate 18, height 5\' 9"  (1.753 m), weight 102.1 kg, SpO2 98%. Physical Exam Gen: no distress, normal appearing HEENT: oral mucosa pink and moist, NCAT Cardio: Reg rate Chest: normal effort, normal rate of breathing Abd: soft, non-distended Ext: no edema Psych: pleasant, normal affect Skin: intact Neuro: Alert and oriented Musculoskeletal: Sling in place in LUE, distally neurologically intact, RUE dressing c/d/I, distally neurologically intact, LLE with hinged knee brace, distally neurologically intact   Lab Results Last 24 Hours       Results for orders placed or performed during the hospital encounter of 04/25/23 (from the past 24 hour(s))  CBC     Status: Abnormal    Collection Time: 04/29/23  5:29 AM  Result Value Ref Range    WBC 8.7 4.0 - 10.5 K/uL    RBC 3.27 (L) 3.87 - 5.11 MIL/uL    Hemoglobin 9.8 (L) 12.0 - 15.0 g/dL    HCT 91.4 (L) 78.2 - 46.0 %    MCV 92.4 80.0 - 100.0 fL    MCH 30.0 26.0 - 34.0 pg    MCHC 32.5 30.0 - 36.0 g/dL    RDW 95.6  21.3 - 08.6 %    Platelets 233 150 - 400 K/uL    nRBC 0.0 0.0 - 0.2 %  Basic metabolic panel     Status: Abnormal    Collection Time: 04/29/23  5:29 AM  Result Value Ref Range    Sodium 133 (L) 135 - 145 mmol/L    Potassium 3.7 3.5 - 5.1 mmol/L    Chloride 103 98 - 111 mmol/L    CO2 22 22 - 32 mmol/L    Glucose, Bld 110 (H) 70 - 99 mg/dL    BUN 21 8 - 23  mg/dL    Creatinine, Ser 1.61 0.44 - 1.00 mg/dL    Calcium 9.0 8.9 - 09.6 mg/dL    GFR, Estimated >04 >54 mL/min    Anion gap 8 5 - 15       Imaging Results (Last 48 hours)  DG Humerus Right   Result Date: 04/28/2023 CLINICAL DATA:  [id number] Humerus fracture [id number] EXAM: RIGHT HUMERUS - 2+ VIEW COMPARISON:  the previous day's study FINDINGS: Plate and screw fixation of the distal humeral shaft fracture, fragments in near anatomic alignment. Advanced degenerative changes of the glenohumeral articulation as before. IMPRESSION: ORIF distal humeral shaft fracture. Electronically Signed   By: Corlis Leak M.D.   On: 04/28/2023 13:09    DG Humerus Right   Result Date: 04/27/2023 CLINICAL DATA:  ORIF of the right humeral fracture. EXAM: RIGHT HUMERUS - 2+ VIEW COMPARISON:  Radiograph dated 04/25/2023 FINDINGS: Three intraoperative fluoroscopic spot images provided. The total fluoroscopic time is 45.5 seconds with cumulative air kerma of 1.68 mGy. Fixation plate and screws noted. IMPRESSION: Intraoperative fluoroscopic spot images. Electronically Signed   By: Elgie Collard M.D.   On: 04/27/2023 18:04    DG C-Arm 1-60 Min-No Report   Result Date: 04/27/2023 Fluoroscopy was utilized by the requesting physician.  No radiographic interpretation.    DG C-Arm 1-60 Min-No Report   Result Date: 04/27/2023 Fluoroscopy was utilized by the requesting physician.  No radiographic interpretation.       Assessment/Plan: Diagnosis: Critical polytrauma Does the need for close, 24 hr/day medical supervision in concert with the patient's rehab needs make  it unreasonable for this patient to be served in a less intensive setting? Yes Co-Morbidities requiring supervision/potential complications:  1) Obesity: provide dietary education 2) normocytic anemia: continue to monitor HGB 3) leukocytosis: continue to monitor WBC HLD, anxiety and depression Due to bladder management, bowel management, safety, skin/wound care, disease management, medication administration, pain management, and patient education, does the patient require 24 hr/day rehab nursing? Yes Does the patient require coordinated care of a physician, rehab nurse, therapy disciplines of PT, OT to address physical and functional deficits in the context of the above medical diagnosis(es)? Yes Addressing deficits in the following areas: balance, endurance, locomotion, strength, transferring, bowel/bladder control, bathing, dressing, feeding, grooming, toileting, and psychosocial support Can the patient actively participate in an intensive therapy program of at least 3 hrs of therapy per day at least 5 days per week? Yes The potential for patient to make measurable gains while on inpatient rehab is excellent Anticipated functional outcomes upon discharge from inpatient rehab are modified independent  with PT, modified independent with OT, modified independent with SLP. Estimated rehab length of stay to reach the above functional goals is: 7-10 days Anticipated discharge destination: Home Overall Rehab/Functional Prognosis: excellent   POST ACUTE RECOMMENDATIONS: This patient's condition is appropriate for continued rehabilitative care in the following setting: CIR Patient has agreed to participate in recommended program. Yes Note that insurance prior authorization may be required for reimbursement for recommended care.     I have personally performed a face to face diagnostic evaluation of this patient. Additionally, I have examined the patient's medical record including any pertinent labs  and radiographic images. If the physician assistant has documented in this note, I have reviewed and edited or otherwise concur with the physician assistant's documentation.   Thanks,   Horton Chin, MD 04/29/2023

## 2023-05-02 NOTE — Progress Notes (Signed)
Signed     Expand All Collapse All PMR Admission Coordinator Pre-Admission Assessment   Patient: Morgan Potter is an 72 y.o., female MRN: [id number] DOB: 1950-10-06 Height: 5\' 9"  (175.3 cm) Weight: 102.1 kg                                                                                                                                                  Insurance Information HMO:     PPO:      PCP:      IPA:      80/20:      OTHER:  PRIMARY: Blue Medicare      Policy#: [id number]      Subscriber: Pt CM Name: Shanda Bumps      Phone#: [phone]     Fax#:  [phone] Pre-Cert#: [id number] approved on 04/30/23.  Pt approved from 05/02/23-05/08/23. Review date is 05/07/23.      Employer:  Eff Date: 07/18/2022 - 05/19/2023 Deductible: does not have one OOP Max: $3,500 ($348.61 met) CIR: $335/day co-pay for days 1-5, $0 copay for days 6-90 SNF: $0.00 Copayment per day for days 1-20; $203 Copayment per day for days 21-60; $0.00 copayment for days 61-100 Maximum of 100 days/benefit period Outpatient:  $10 copay/visit Home Health:  100% coverage DME: 80% coverage, 20% co-insurance Providers: in network   SECONDARY:      Policy#:       Phone#:    Artist:       Phone#:    The Data processing manager" for patients in Inpatient Rehabilitation Facilities with attached "Privacy Act Statement-Health Care Records" was provided and verbally reviewed with: Patient   Emergency Contact Information Contact Information       Name Relation Home Work Mobile    Atkins,[name] Daughter     [phone]         Other Contacts   None on File      Current Medical History  Patient Admitting Diagnosis: Polytrauma   History of Present Illness:  Morgan Potter is a 72 y.o. female with medical history significant of hypertension, hyperlipidemia, anxiety, GERD, and Jehovah witness who presented to the [facility] on 04/25/23 after having a fall. She was rushing out of  her gate with coffee in hand when she tripped over a pot and fell on her face. She denies loss of consciousness, but was unable to get up and remained on the ground for approximately 15 minutes before she is able to notify another family member to get her daughter.  After a fall patient reported having severe pain in both arms right worse than the left as well as her knee.     In the emergency department patient was noted to be afebrile with blood pressures elevated up to 161/93.  Labs significant for WBC 12.5, hemoglobin 11.9, and glucose  122.  X-rays revealed acute fracture involving the distal diaphysis of the right humerus, communicated mildly displaced acute fracture of the left humeral head, and nondisplaced transverse fracture of the left patella.  CT imaging of the head and cervical spine did not note any acute intercranial or cervical abnormality.  Patient was also noted to have sustained a laceration which was repaired by the ED provider.  Right eyebrow laceration was repaired with sutures by the ED physician.  Dr. Roda Shutters of orthopedics was consulted/ She had been given fentanyl IV without issue while in the emergency department. Pt. Underwent ORIF R humerus on 04/27/23. Pt. Was seen by PT/OT/SLP with recommendations for CIR to assist return to PLOF.  Glasgow Coma Scale Score: 15   Patient's medical record fro [facility]   has been reviewed by the rehabilitation admission coordinator and physician.   Past Medical History      Past Medical History:  Diagnosis Date   Dizziness and giddiness     Dyspnea     Fatigue     Lightheadedness     Palpitations     SOB (shortness of breath)     Weakness            Has the patient had major surgery during 100 days prior to admission? Yes   Family History  family history includes Arrhythmia in her mother; Cancer in her father; Heart attack in her father.     Current Medications   Current Medications    Current  Facility-Administered Medications:    acetaminophen (TYLENOL) tablet 1,000 mg, 1,000 mg, Oral, TID, Dahal, Melina Schools, MD   bisacodyl (DULCOLAX) suppository 10 mg, 10 mg, Rectal, Daily PRN, Rai, Ripudeep K, MD   buPROPion (WELLBUTRIN XL) 24 hr tablet 300 mg, 300 mg, Oral, Daily, West Bali, PA-C, 300 mg at 05/02/23 5284   cholecalciferol (VITAMIN D3) 25 MCG (1000 UNIT) tablet 1,000 Units, 1,000 Units, Oral, Daily, Rai, Ripudeep K, MD, 1,000 Units at 05/02/23 0842   diphenhydrAMINE (BENADRYL) 12.5 MG/5ML elixir 12.5-25 mg, 12.5-25 mg, Oral, Q4H PRN, West Bali, PA-C, 25 mg at 04/29/23 2210   docusate sodium (COLACE) capsule 100 mg, 100 mg, Oral, BID, West Bali, PA-C, 100 mg at 05/01/23 2139   enoxaparin (LOVENOX) injection 40 mg, 40 mg, Subcutaneous, Q24H, McClung, Sarah A, PA-C, 40 mg at 05/01/23 1637   escitalopram (LEXAPRO) tablet 20 mg, 20 mg, Oral, Daily, West Bali, PA-C, 20 mg at 05/02/23 0842   fentaNYL (SUBLIMAZE) injection 50 mcg, 50 mcg, Intravenous, Once, McClung, Sarah A, PA-C   ferrous sulfate tablet 325 mg, 325 mg, Oral, Q breakfast, West Bali, PA-C, 325 mg at 05/02/23 0842   fluticasone (FLONASE) 50 MCG/ACT nasal spray 1 spray, 1 spray, Each Nare, Daily, Dahal, Binaya, MD, 1 spray at 05/02/23 0843   hydrALAZINE (APRESOLINE) injection 10 mg, 10 mg, Intravenous, Q4H PRN, McClung, Sarah A, PA-C   hydrochlorothiazide (HYDRODIURIL) tablet 12.5 mg, 12.5 mg, Oral, Daily, Thyra Breed A, PA-C, 12.5 mg at 05/02/23 0842   HYDROcodone-acetaminophen (NORCO) 7.5-325 MG per tablet 1-2 tablet, 1-2 tablet, Oral, Q6H PRN, [place] Bali, PA-C, 2 tablet at 04/29/23 0950   HYDROcodone-acetaminophen (NORCO/VICODIN) 5-325 MG per tablet 1-2 tablet, 1-2 tablet, Oral, Q6H PRN, West Bali, PA-C, 2 tablet at 04/29/23 1530   HYDROmorphone (DILAUDID) injection 1 mg, 1 mg, Intravenous, Q3H PRN, Thyra Breed A, PA-C, 1 mg at 04/29/23 2346   ibuprofen (ADVIL) tablet 400 mg,  400  mg, Oral, Q6H PRN, West Bali, PA-C   LORazepam (ATIVAN) tablet 1 mg, 1 mg, Oral, BID PRN, West Bali, PA-C, 1 mg at 05/02/23 8295   losartan (COZAAR) tablet 50 mg, 50 mg, Oral, Daily, [place] Bali, PA-C, 50 mg at 05/02/23 6213   methocarbamol (ROBAXIN) tablet 500 mg, 500 mg, Oral, Q6H PRN, 500 mg at 05/01/23 2139 **OR** methocarbamol (ROBAXIN) injection 500 mg, 500 mg, Intravenous, Q6H PRN, McClung, Sarah A, PA-C   metoCLOPramide (REGLAN) tablet 5-10 mg, 5-10 mg, Oral, Q8H PRN **OR** metoCLOPramide (REGLAN) injection 5-10 mg, 5-10 mg, Intravenous, Q8H PRN, McClung, Sarah A, PA-C   montelukast (SINGULAIR) tablet 10 mg, 10 mg, Oral, QHS, Dahal, Binaya, MD, 10 mg at 05/01/23 2139   ondansetron (ZOFRAN) tablet 4 mg, 4 mg, Oral, Q6H PRN **OR** ondansetron (ZOFRAN) injection 4 mg, 4 mg, Intravenous, Q6H PRN, McClung, Sarah A, PA-C   pantoprazole (PROTONIX) EC tablet 40 mg, 40 mg, Oral, Daily, Thyra Breed A, PA-C, 40 mg at 05/02/23 0843   polyethylene glycol (MIRALAX / GLYCOLAX) packet 17 g, 17 g, Oral, Daily, Rai, Ripudeep K, MD, 17 g at 04/30/23 0914   pravastatin (PRAVACHOL) tablet 20 mg, 20 mg, Oral, q1800, McClung, Sarah A, PA-C, 20 mg at 05/01/23 1817   senna-docusate (Senokot-S) tablet 2 tablet, 2 tablet, Oral, QHS, Rai, Ripudeep K, MD, 2 tablet at 05/01/23 2140     Patients Current Diet:  Diet Order                  Diet general             Diet regular Room service appropriate? Yes; Fluid consistency: Thin  Diet effective now                         Precautions / Restrictions Precautions Precautions: Fall, Shoulder Type of Shoulder Precautions: L shoulder no ROM, ok for elbow/wrist/hand; R UE unrestricted ROM Precaution Booklet Issued: No Other Brace: knee brace L LE Restrictions Weight Bearing Restrictions Per Provider Order: Yes RUE Weight Bearing Per Provider Order: Non weight bearing LUE Weight Bearing Per Provider Order: Non weight bearing LLE  Weight Bearing Per Provider Order: Weight bearing as tolerated Other Position/Activity Restrictions: L UE sling and no shoulder ROM, R UE ROM unrestricted; playmakers brace 0-60 L knee    Has the patient had 2 or more falls or a fall with injury in the past year?Yes   Prior Activity Level Community (5-7x/wk): Pt. active in the community PTA   Prior Functional Level Prior Function Prior Level of Function : Independent/Modified Independent, Driving   Self Care: Did the patient need help bathing, dressing, using the toilet or eating?  Independent   Indoor Mobility: Did the patient need assistance with walking from room to room (with or without device)? Independent   Stairs: Did the patient need assistance with internal or external stairs (with or without device)? Independent   Functional Cognition: Did the patient need help planning regular tasks such as shopping or remembering to take medications? Independent   Patient Information Are you of Hispanic, Latino/a,or Spanish origin?: A. No, not of Hispanic, Latino/a, or Spanish origin What is your race?: A. White Do you need or want an interpreter to communicate with a doctor or health care staff?: 0. No   Patient's Response To:  Health Literacy and Transportation Is the patient able to respond to health literacy and transportation needs?: Yes Health Literacy - How  often do you need to have someone help you when you read instructions, pamphlets, or other written material from your doctor or pharmacy?: Never In the past 12 months, has lack of transportation kept you from medical appointments or from getting medications?: No In the past 12 months, has lack of transportation kept you from meetings, work, or from getting things needed for daily living?: No   Home Assistive Devices / Equipment Home Equipment: Shower seat   Prior Device Use: Indicate devices/aids used by the patient prior to current illness, exacerbation or injury? None of  the above   Current Functional Level Cognition   Overall Cognitive Status: Within Functional Limits for tasks assessed Orientation Level: Oriented X4    Extremity Assessment (includes Sensation/Coordination)   Upper Extremity Assessment: Defer to OT evaluation RUE Deficits / Details: s/p ORIF to humerus, unresticted ROM but nerve block in place.  PROM limited to elbow and shoulder, AROM supination and hand.  Decreased sensation RUE: Unable to fully assess due to pain RUE Sensation: decreased light touch RUE Coordination: decreased fine motor, decreased gross motor LUE Deficits / Details: in sling, no ROM at shoulder.  not formally assessed due to pain. Elbow ROM to wash face and WFL hand movement LUE: Unable to fully assess due to pain, Unable to fully assess due to immobilization LUE Sensation: WNL LUE Coordination: decreased gross motor  Lower Extremity Assessment: LLE deficits/detail LLE Deficits / Details: Patella fx with Playmaker brace donned; set 0-60 deg.     ADLs   Overall ADL's : Needs assistance/impaired Eating/Feeding: Set up, Bed level Eating/Feeding Details (indicate cue type and reason): patient had completed breakfast at bedlevel and patient states she fed self with LUE Grooming: Wash/dry hands, Wash/dry face, Set up Grooming Details (indicate cue type and reason): with RUE Upper Body Dressing : Moderate assistance, Sitting Upper Body Dressing Details (indicate cue type and reason): gown to cover back Lower Body Dressing: Total assistance, +2 for physical assistance, Sit to/from stand Toilet Transfer: Moderate assistance, Ambulation, Regular Toilet, BSC/3in1 Toilet Transfer Details (indicate cue type and reason): mod assist for sit to stand and assistance to lower to [facility] over toilet Toileting- Clothing Manipulation and Hygiene: Total assistance, Sit to/from stand Functional mobility during ADLs: +2 for physical assistance, Maximal assistance, Cueing for  safety General ADL Comments: Nursing assisted with toilet hygiene due to patient stating she did not want a man to assist     Mobility   Overal bed mobility: Needs Assistance Bed Mobility: Supine to Sit Supine to sit: Contact guard General bed mobility comments: cues for WB precautions     Transfers   Overall transfer level: Needs assistance Equipment used: None Transfers: Sit to/from Stand, Bed to chair/wheelchair/BSC Sit to Stand: Mod assist Bed to/from chair/wheelchair/BSC transfer type:: Step pivot Step pivot transfers: Min assist General transfer comment: mod assist for sit to stands from EOB and recliner     Ambulation / Gait / Stairs / Wheelchair Mobility   Ambulation/Gait Ambulation/Gait assistance: Min assist, +2 safety/equipment Gait Distance (Feet): 80 Feet Assistive device: None Gait Pattern/deviations: Step-through pattern, Decreased stance time - left, Antalgic, Decreased weight shift to left General Gait Details: MinA for dynamic balance, antalgic gait pattern Gait velocity: decreased Gait velocity interpretation: <1.31 ft/sec, indicative of household ambulator     Posture / Balance Dynamic Sitting Balance Sitting balance - Comments: EOB Balance Overall balance assessment: Needs assistance Sitting-balance support: No upper extremity supported, Feet supported Sitting balance-Leahy Scale: Fair Sitting balance - Comments:  EOB Standing balance support: No upper extremity supported, During functional activity Standing balance-Leahy Scale: Poor Standing balance comment: relies on external support     Special needs/care consideration Skin Abrasion: arm, leg, knee/left; Surgical Incision: arm/right         Previous Home Environment (from acute therapy documentation) Living Arrangements: Children, Other (Comment) (4 great grandchildren 6 y/o-15 y/o) Available Help at Discharge: Family Type of Home: House (mobile home in front and built onto in the back) Home  Layout: One level Home Access: Stairs to enter Entrance Stairs-Rails: None Secretary/administrator of Steps: 2 Bathroom Shower/Tub: Health visitor: Handicapped height Bathroom Accessibility: Yes How Accessible: Accessible via walker, Accessible via wheelchair Additional Comments: possilby has wc   Discharge Living Setting Plans for Discharge Living Setting: Patient's home Type of Home at Discharge: House Discharge Home Layout: One level Discharge Home Access: Stairs to enter Entrance Stairs-Rails: None Entrance Stairs-Number of Steps: 2 Discharge Bathroom Shower/Tub: Walk-in shower Discharge Bathroom Toilet: Handicapped height Discharge Bathroom Accessibility: No   Social/Family/Support Systems Patient Roles: Other (Comment) Contact Information: [phone] Anticipated Caregiver: 24/7 Anticipated Caregiver's Contact Information: Darel Hong (daughter) home most the time. Other family can assist at d/c Caregiver Availability: 24/7 Discharge Plan Discussed with Primary Caregiver: Yes Is Caregiver In Agreement with Plan?: Yes Does Caregiver/Family have Issues with Lodging/Transportation while Pt is in Rehab?: Yes     Goals Patient/Family Goal for Rehab: PT/OT: Supervision-Min A Expected length of stay: 14-16 days Pt/Family Agrees to Admission and willing to participate: Yes Program Orientation Provided & Reviewed with Pt/Caregiver Including Roles  & Responsibilities: No     Decrease burden of Care through IP rehab admission: not anticipated     Possible need for SNF placement upon discharge:not anticipated     Patient Condition: This patient's medical and functional status has changed since the consult dated: 04/29/23 in which the Rehabilitation Physician determined and documented that the patient's condition is appropriate for intensive rehabilitative care in an inpatient rehabilitation facility. See "History of Present Illness" (above) for medical update.  Functional changes are: Pt has progressed to Min A +2-Mod A with mobility and Mod A with basic ADLs. Patient's medical and functional status update has been discussed with the Rehabilitation physician and patient remains appropriate for inpatient rehabilitation. Will admit to inpatient rehab today.   Preadmission Screen Completed By:  Trish Mage, RN, with updates by Wolfgang Phoenix 05/02/2023 9:02 AM ______________________________________________________________________   Discussed status with Dr. Berline Chough on 05/02/23 at 9:02 AM and received approval for admission today.   Admission Coordinator:  Trish Mage, time 9:02 AM/Date 05/02/23

## 2023-05-02 NOTE — Progress Notes (Addendum)
Inpatient Rehabilitation Admission Medication Review by a Pharmacist  A complete drug regimen review was completed for this patient to identify any potential clinically significant medication issues.  High Risk Drug Classes Is patient taking? Indication by Medication  Antipsychotic No   Anticoagulant Yes Enoxaparin - VTE prophylaxis  Antibiotic No   Opioid Yes PRN Norco - moderate pain  Antiplatelet No   Hypoglycemics/insulin No   Vasoactive Medication Yes Losartan, hydrochlorothiazide - hypertension  Chemotherapy No   Other Yes Bupropion XL, escitalopram - anxiety/depression Pantoprazole - GERD Pravastatin - hyperlipidemia Ferrous sulfate, Vitamin D, thiamine - supplements Montelukast, Fluticasone nasal - allergies Docusate BID, Miralax, senna-docusate hs - laxatives  PRNs: Ibuprofen - mild main Lorazepam - anxiety Methocarbamol - muscle spasms Ondansetron - nausea Bisacodyl - constipation     Type of Medication Issue Identified Description of Issue Recommendation(s)  Drug Interaction(s) (clinically significant)     Duplicate Therapy     Allergy     No Medication Administration End Date     Incorrect Dose     Additional Drug Therapy Needed     Significant med changes from prior encounter (inform family/care partners about these prior to discharge). Schedule diclofenac discontinued.  Montelukast (as at home) and Fluticasone nasal (new) added 12/13 > not continued on transfer.  Also off home Thiamine.   PRN ibuprofen for mild pain.  Follow up for continuing Fluticasone nasal, Montelukast.   Resume while in CIR or at discharge.  Communicate changes with patient/family prior to discharge.  Other  Hyzaar 50-12.5 mg prior to admit > using individual components. Pantoprazole for home Esomeprazole per hospital substitution. Resume home Hyzaar and Esomeprazole at discharge.    Clinically significant medication issues were identified that warrant physician  communication and completion of prescribed/recommended actions by midnight of the next day:  No  Name of provider notified for urgent issues identified:   Provider Method of Notification:   Pharmacist comments:   - Non-urgent, but will follow up for continuing Montelukast and Fluticasone nasal for allergies. Both added 12/13.  Addendum: 12/15 11:35am - Okay to resume/continue Montelukast, Fluticasone nasal and Thiamine per [address], PA-C  Time spent performing this drug regimen review (minutes):  20   Dennie Fetters, [place] 05/02/2023 5:44 PM  Addendum: 05/03/2023 11:37 AM

## 2023-05-03 ENCOUNTER — Inpatient Hospital Stay (HOSPITAL_BASED_OUTPATIENT_CLINIC_OR_DEPARTMENT_OTHER): Payer: Medicare Other

## 2023-05-03 DIAGNOSIS — S42292A Other displaced fracture of upper end of left humerus, initial encounter for closed fracture: Secondary | ICD-10-CM

## 2023-05-03 DIAGNOSIS — K5901 Slow transit constipation: Secondary | ICD-10-CM

## 2023-05-03 DIAGNOSIS — I10 Essential (primary) hypertension: Secondary | ICD-10-CM | POA: Diagnosis not present

## 2023-05-03 DIAGNOSIS — T07XXXA Unspecified multiple injuries, initial encounter: Secondary | ICD-10-CM | POA: Diagnosis not present

## 2023-05-03 MED ORDER — DIPHENHYDRAMINE-ZINC ACETATE 2-0.1 % EX CREA
TOPICAL_CREAM | Freq: Three times a day (TID) | CUTANEOUS | Status: DC | PRN
Start: 2023-05-03 — End: 2023-05-12
  Administered 2023-05-03: 1 via TOPICAL
  Filled 2023-05-03: qty 28

## 2023-05-03 MED ORDER — MONTELUKAST SODIUM 10 MG PO TABS
10.0000 mg | ORAL_TABLET | Freq: Every day | ORAL | Status: DC
  Administered 2023-05-03 – 2023-05-12 (×10): 10 mg via ORAL
  Filled 2023-05-03 (×10): qty 1

## 2023-05-03 MED ORDER — THIAMINE MONONITRATE 100 MG PO TABS
100.0000 mg | ORAL_TABLET | Freq: Every day | ORAL | Status: DC
  Administered 2023-05-03 – 2023-05-06 (×4): 100 mg via ORAL
  Filled 2023-05-03 (×4): qty 1

## 2023-05-03 MED ORDER — FLUTICASONE PROPIONATE 50 MCG/ACT NA SUSP
1.0000 | Freq: Every day | NASAL | Status: DC
Start: 2023-05-03 — End: 2023-05-12
  Administered 2023-05-03 – 2023-05-12 (×10): 1 via NASAL
  Filled 2023-05-03: qty 16

## 2023-05-03 NOTE — Evaluation (Signed)
Physical Therapy Assessment and Plan  Patient Details  Name: Morgan Potter MRN: [id number] Date of Birth: December 17, 1950  PT Diagnosis: Abnormality of gait, Difficulty walking, Impaired sensation, and Muscle weakness Rehab Potential: Fair ELOS: 12 to 14 days   Today's Date: 05/03/2023 PT Individual Time: 0802-0917 PT Individual Time Calculation (min): 75 min    Hospital Problem: Principal Problem:   Critical polytrauma   Past Medical History:  Past Medical History:  Diagnosis Date   Dizziness and giddiness    Dyspnea    Fatigue    Lightheadedness    Palpitations    SOB (shortness of breath)    Weakness    Past Surgical History:  Past Surgical History:  Procedure Laterality Date   APPENDECTOMY  1976   CESAREAN SECTION  1981   CESAREAN SECTION  1969   cholecystectomy  1976   ORIF HUMERUS FRACTURE Right 04/27/2023   Procedure: OPEN REDUCTION INTERNAL FIXATION (ORIF) HUMERAL SHAFT FRACTURE;  Surgeon: Roby Lofts, MD;  Location: [facility] OR;  Service: Orthopedics;  Laterality: Right;    Assessment & Plan Clinical Impression: Patient is a 72 year old left-handed female with past medical history of hypertension, anxiety, GERD, hyperlipidemia. Per chart review patient lives with her family including 4 great-grandchildren from 45-year-old to a 78 year old. Mobile home with 2 steps to entry. Independent prior to admission. She is Jehovah witness. Presented 04/25/2023 after a fall when she was rushing out of her gait with a coffee in her hand when she tripped over a pot and fell on her face. She denied loss of consciousness but was unable to get up and remained on the ground for approximately 15 minutes. Admission chemistries unremarkable except WBC 12,500, CK1 28. CT of the brain as well as cervical spine negative. X-rays and imaging revealed acute fracture involving the distal diaphysis of the right humerus, comminuted mildly displaced acute fracture of the left humeral head and nondisplaced  transverse fracture left patella. Underwent open reduction trial fixation of right humeral shaft fracture 04/27/2023 per Dr. Jena Gauss.. Nonoperative left upper extremity placed in a sling as well as left lower extremity knee brace. Nonweightbearing to right upper extremity with shoulder sling immobilizer at all times off for dressing bathing exercises as well as nonweightbearing to left upper extremity. Weightbearing as tolerated to left lower extremity. Acute blood loss anemia 9.3 and monitored. Mild hyponatremia 132 and monitored. Placed on Lovenox for DVT prophylaxis. Therapy evaluations completed due to patient decreased functional mobility was admitted for a comprehensive rehab program.    Patient transferred to CIR on 05/02/2023 .   Patient currently requires mod with mobility secondary to muscle weakness and decreased cardiorespiratoy endurance.  Prior to hospitalization, patient was independent  with mobility and lived with Family, Daughter (4 great grand childred (40 to 83 years old)) in a House (mobilie home with a home built on the back) home.  Home access is 2Stairs to enter.  Patient will benefit from skilled PT intervention to maximize safe functional mobility, minimize fall risk, and decrease caregiver burden for planned discharge home with 24 hour assist.  Anticipate patient will benefit from follow up [facility] at discharge.  PT - End of Session Activity Tolerance: Tolerates 30+ min activity with multiple rests Endurance Deficit: Yes PT Assessment Rehab Potential (ACUTE/IP ONLY): Fair PT Barriers to Discharge: Inaccessible home environment;Decreased caregiver support PT Barriers to Discharge Comments: 2 steps with no rails to enter and 1 step no rails in house to get to main level, pt also doesn't  24/7 S/assistance PT Patient demonstrates impairments in the following area(s): Balance;Endurance;Safety PT Transfers Functional Problem(s): Bed Mobility;Bed to Chair;Car PT Locomotion Functional  Problem(s): Ambulation;Stairs;Wheelchair Mobility PT Plan PT Intensity: Minimum of 1-2 x/day ,45 to 90 minutes PT Frequency: 5 out of 7 days PT Duration Estimated Length of Stay: 12 to 14 days PT Treatment/Interventions: Ambulation/gait training;Balance/vestibular training;Discharge planning;Functional electrical stimulation;DME/adaptive equipment instruction;Functional mobility training;Neuromuscular re-education;Patient/family education;Stair training;Splinting/orthotics;Therapeutic Activities;UE/LE Strength taining/ROM;UE/LE Coordination activities;Wheelchair propulsion/positioning;Therapeutic Exercise PT Transfers Anticipated Outcome(s): contact gaurd transfers PT Locomotion Anticipated Outcome(s): contact gaurd gait, min A stairs, mod I w/c mobility PT Recommendation Recommendations for Other Services: None Follow Up Recommendations: Home health PT;Outpatient PT Patient destination: Home Equipment Recommended: To be determined   PT Evaluation Precautions/Restrictions Precautions Precautions: Shoulder;Fall Type of Shoulder Precautions: L UE avoid passive and active shoulder ROM x 2 weeks, OK unrestricted elbow/wrist ROM Shoulder Interventions: Shoulder sling/immobilizer Precaution Booklet Issued: No Precaution Comments: R UE unrestricted ROM, L LE OK for for 0 to 60 degrees knee flexion in hinged brace Required Braces or Orthoses: Sling;Other Brace Other Brace: L LE hinged knee brace Restrictions Weight Bearing Restrictions Per Provider Order: Yes RUE Weight Bearing Per Provider Order: Non weight bearing LUE Weight Bearing Per Provider Order: Non weight bearing LLE Weight Bearing Per Provider Order: Weight bearing as tolerated General Chart Reviewed: Yes Family/Caregiver Present: No Vital Signs  Pain Pt c/o 1/10 pain all over(arthritic) Pain Interference Pain Interference Pain Effect on Sleep: 1. Rarely or not at all Pain Interference with Therapy Activities: 2.  Occasionally Pain Interference with Day-to-Day Activities: 2. Occasionally Home Living/Prior Functioning Home Living Type of Home: House (mobilie home with a home built on the back) Home Access: Stairs to enter Secretary/administrator of Steps: 2 Entrance Stairs-Rails: None Home Layout: One level  Lives With: Family;Daughter (4 great grand childred (55 to 12 years old)) Prior Function Level of Independence: Independent with gait;Independent with transfers  Able to Take Stairs?: Reciprically Driving: Yes Vocation: Retired Vision/Perception  Pt wears reading glasses, hx cataract Preception: WFLs Cognition Overall Cognitive Status: Within Functional Limits for tasks assessed Arousal/Alertness: Awake/alert Orientation Level: Oriented X4 Year: 2024 Month: December Day of Week: Correct Memory: Appears intact Awareness: Appears intact Problem Solving: Appears intact Safety/Judgment: Appears intact Sensation Sensation Light Touch: Impaired Detail Peripheral sensation comments: impaired light touch B feet Light Touch Impaired Details: Impaired LLE;Impaired RLE Coordination Gross Motor Movements are Fluid and Coordinated: No Motor  Motor Motor: Within Functional Limits Motor - Skilled Clinical Observations: generalized weakness  Bed Mobility Bed Mobility: Supine to Sit;Sit to Supine Supine to Sit: Moderate Assistance - Patient 50-74%;Minimal Assistance - Patient > 75% Sit to Supine: Minimal Assistance - Patient > 75%;Moderate Assistance - Patient 50-74% Transfers Transfers: Sit to Stand;Stand to Sit;Stand Pivot Transfers Sit to Stand: Moderate Assistance - Patient 50-74%;Minimal Assistance - Patient > 75% Stand to Sit: Minimal Assistance - Patient > 75%;Moderate Assistance - Patient 50-74% Stand Pivot Transfers: Moderate Assistance - Patient 50 - 74%;Minimal Assistance - Patient > 75% Transfer (Assistive device): None Gait Ambulation: Yes Gait Assistance: Minimal Assistance -  Patient > 75%;Moderate Assistance - Patient 50-74% Gait Distance (Feet): 50 Feet Assistive device: None Gait Gait: Yes Gait velocity: decreased Stairs / Additional Locomotion Stairs: Yes (to be assessed when appropriate) Wheelchair Mobility Wheelchair Mobility: Yes Wheelchair Assistance: Dependent - Patient 0% Distance: 150 Trunk/Postural Assessment  Cervical Assessment Cervical Assessment: Exceptions to [facility] (forward head posture) Thoracic Assessment Thoracic Assessment: Exceptions to [facility] (rounded shoulders) Lumbar Assessment Lumbar Assessment: Exceptions  to [facility] Postural Control Postural Control: Within Functional Limits  Balance Balance Balance Assessed: Yes Static Sitting Balance Static Sitting - Balance Support: Feet supported Static Sitting - Level of Assistance: 5: Stand by assistance Static Standing Balance Static Standing - Balance Support: During functional activity;No upper extremity supported Static Standing - Level of Assistance: 4: Min assist Dynamic Standing Balance Dynamic Standing - Balance Support: During functional activity;No upper extremity supported Dynamic Standing - Level of Assistance: 3: Mod assist Extremity Assessment  B UEs as per OT evaluation.  RLE Assessment Passive Range of Motion (PROM) Comments: WFLs General Strength Comments: grossly 3+/5 to 4/5 LLE Assessment LLE Assessment: Exceptions to [facility] Passive Range of Motion (PROM) Comments: WFLs except knee flexion limited 0 to 60 degrees (hinged knee brace) General Strength Comments: grossly 2+/5 to 3-/5  Care Tool Care Tool Bed Mobility Roll left and right activity Roll left and right activity did not occur: Safety/medical concerns      Sit to lying activity   Sit to lying assist level: Moderate Assistance - Patient 50 - 74%    Lying to sitting on side of bed activity   Lying to sitting on side of bed assist level: the ability to move from lying on the back to sitting on the side of the  bed with no back support.: Moderate Assistance - Patient 50 - 74%     Care Tool Transfers Sit to stand transfer   Sit to stand assist level: Moderate Assistance - Patient 50 - 74%    Chair/bed transfer   Chair/bed transfer assist level: Moderate Assistance - Patient 50 - 74%    Car transfer   Car transfer assist level: Moderate Assistance - Patient 50 - 74%      Care Tool Locomotion Ambulation   Assist level: Moderate Assistance - Patient 50 - 74% Assistive device: No Device Max distance: 50  Walk 10 feet activity   Assist level: Moderate Assistance - Patient - 50 - 74% Assistive device: No Device   Walk 50 feet with 2 turns activity   Assist level: Moderate Assistance - Patient - 50 - 74% Assistive device: No Device  Walk 150 feet activity Walk 150 feet activity did not occur: Safety/medical concerns      Walk 10 feet on uneven surfaces activity Walk 10 feet on uneven surfaces activity did not occur: Safety/medical concerns      Stairs Stair activity did not occur: Safety/medical concerns        Walk up/down 1 step activity Walk up/down 1 step or curb (drop down) activity did not occur: Safety/medical concerns      Walk up/down 4 steps activity Walk up/down 4 steps activity did not occur: Safety/medical concerns      Walk up/down 12 steps activity Walk up/down 12 steps activity did not occur: Safety/medical concerns      Pick up small objects from floor Pick up small object from the floor (from standing position) activity did not occur: Safety/medical concerns      Wheelchair Is the patient using a wheelchair?: Yes Type of Wheelchair: Manual   Wheelchair assist level: Dependent - Patient 0% Max wheelchair distance: 150  Wheel 50 feet with 2 turns activity   Assist Level: Dependent - Patient 0%  Wheel 150 feet activity   Assist Level: Dependent - Patient 0%    Refer to Care Plan for Long Term Goals  SHORT TERM GOAL WEEK 1 PT Short Term Goal 1 (Week 1): Pt  will increase  bed mobility to min A PT Short Term Goal 2 (Week 1): Pt will increase transfers to min A. PT Short Term Goal 3 (Week 1): Pt will ambulate without assistive device about 100 feet with min A PT Short Term Goal 4 (Week 1): Pt will ascend/descend 1 to 2 steps with no rails and min A PT Short Term Goal 5 (Week 1): Pt will propel w/c about 50 feet with B LEs and S  Recommendations for other services: None   Skilled Therapeutic Intervention PT evaluation completed and treatment plan initiated. Pt's transfers fluctuated between min to mod A with increased assistance required as pt fatigue or from lower surfaces. Pt requires verbal cues throughout treatment to maintain NWB R UE as pt wants to assist with R UE. Pt returned to room following treatment and left sitting up in bed with bed alarm and all needs within reach.  Discharge Criteria: Patient will be discharged from PT if patient refuses treatment 3 consecutive times without medical reason, if treatment goals not met, if there is a change in medical status, if patient makes no progress towards goals or if patient is discharged from hospital.  The above assessment, treatment plan, treatment alternatives and goals were discussed and mutually agreed upon: by patient  Rayford Halsted 05/03/2023, 12:59 PM

## 2023-05-03 NOTE — Discharge Instructions (Signed)
Inpatient Rehab Discharge Instructions  Morgan Potter Discharge date and time: No discharge date for patient encounter.   Activities/Precautions/ Functional Status: Activity: Weightbearing as tolerated left lower extremity with knee immobilizer.  Nonweightbearing bilateral upper extremities Diet: Regular Wound Care: Routine skin checks Functional status:  ___ No restrictions     ___ Walk up steps independently ___ 24/7 supervision/assistance   ___ Walk up steps with assistance ___ Intermittent supervision/assistance  ___ Bathe/dress independently ___ Walk with walker     _x__ Bathe/dress with assistance ___ Walk Independently    ___ Shower independently ___ Walk with assistance    ___ Shower with assistance ___ No alcohol     ___ Return to work/school ________  Special Instructions: No driving smoking or alcohol  COMMUNITY REFERRALS UPON DISCHARGE:    Home Health:   P T   &   OT                Agency: [facility] Phone: [phone]  Medical Equipment/Items Ordered:NEEDS WIDE BEDSIDE COMMODE BUT WILL GET ON OWN DUE TO NOT COVERED BY INSURANCE                                                 Agency/Supplier:NA  My questions have been answered and I understand these instructions. I will adhere to these goals and the provided educational materials after my discharge from the hospital.  Patient/Caregiver Signature _______________________________ Date __________  Clinician Signature _______________________________________ Date __________  Please bring this form and your medication list with you to all your follow-up doctor's appointments.

## 2023-05-03 NOTE — Evaluation (Signed)
Occupational Therapy Assessment and Plan  Patient Details  Name: Morgan Potter MRN: [id number] Date of Birth: 1950-11-27  OT Diagnosis: muscle weakness (generalized) Rehab Potential: Rehab Potential (ACUTE ONLY): Good ELOS: 12-14  weeks   Today's Date: 05/03/2023 OT Individual Time: 0100-0215 OT Individual Time Calculation (min): 75 min     Hospital Problem: Principal Problem:   Critical polytrauma   Past Medical History:  Past Medical History:  Diagnosis Date   Dizziness and giddiness    Dyspnea    Fatigue    Lightheadedness    Palpitations    SOB (shortness of breath)    Weakness    Past Surgical History:  Past Surgical History:  Procedure Laterality Date   APPENDECTOMY  1976   CESAREAN SECTION  1981   CESAREAN SECTION  1969   cholecystectomy  1976   ORIF HUMERUS FRACTURE Right 04/27/2023   Procedure: OPEN REDUCTION INTERNAL FIXATION (ORIF) HUMERAL SHAFT FRACTURE;  Surgeon: Roby Lofts, MD;  Location: [facility] OR;  Service: Orthopedics;  Laterality: Right;    Assessment & Plan Clinical Impression: Morgan Potter is a 72 year old left-handed female with past medical history of hypertension, anxiety, GERD, hyperlipidemia. Per chart review patient lives with her family including 4 great-grandchildren from 56-year-old to a 64 year old. Mobile home with 2 steps to entry. Independent prior to admission. She is Jehovah witness. Presented 04/25/2023 after a fall when she was rushing out of her gait with a coffee in her hand when she tripped over a pot and fell on her face. She denied loss of consciousness but was unable to get up and remained on the ground for approximately 15 minutes. Admission chemistries unremarkable except WBC 12,500, CK1 28. CT of the brain as well as cervical spine negative. X-rays and imaging revealed acute fracture involving the distal diaphysis of the right humerus, comminuted mildly displaced acute fracture of the left humeral head and nondisplaced transverse  fracture left patella. Underwent open reduction trial fixation of right humeral shaft fracture 04/27/2023 per Dr. Jena Gauss.. Nonoperative left upper extremity placed in a sling as well as left lower extremity knee brace. Nonweightbearing to right upper extremity with shoulder sling immobilizer at all times off for dressing bathing exercises as well as nonweightbearing to left upper extremity. Weightbearing as tolerated to left lower extremity. Acute blood loss anemia 9.3 and monitored. Mild hyponatremia 132 and monitored. Placed on Lovenox for DVT prophylaxis. Therapy evaluations completed due to patient decreased functional mobility was admitted for a comprehensive rehab program.  Patient transferred to CIR on 05/02/2023 .    Patient currently requires mod to MaxA with basic self-care skills secondary to muscle weakness.  Prior to hospitalization, patient could complete BADL's with independent .  Patient will benefit from skilled intervention to increase independence with basic self-care skills prior to discharge home with care partner.  Anticipate patient will require 24 hour supervision and follow up home health.  OT - End of Session Activity Tolerance: Tolerates 30+ min activity with multiple rests Endurance Deficit: Yes OT Assessment Rehab Potential (ACUTE ONLY): Good OT Patient demonstrates impairments in the following area(s): Balance;Endurance;Pain;Motor OT Basic ADL's Functional Problem(s): Grooming;Bathing;Dressing;Toileting OT Additional Impairment(s): Fuctional Use of Upper Extremity OT Plan OT Intensity: Minimum of 1-2 x/day, 45 to 90 minutes OT Frequency: Total of 15 hours over 7 days of combined therapies;5 out of 7 days OT Duration/Estimated Length of Stay: 12-14  weeks OT Treatment/Interventions: DME/adaptive equipment instruction;Therapeutic Activities;UE/LE Strength taining/ROM;Therapeutic Exercise;Self Care/advanced ADL retraining;Balance/vestibular training;UE/LE Coordination  activities OT  Self Feeding Anticipated Outcome(s): s/u OT Basic Self-Care Anticipated Outcome(s): ModI to MinA OT Toileting Anticipated Outcome(s): ModI to [employer] OT Bathroom Transfers Anticipated Outcome(s): ModI OT Recommendation Equipment Recommended: 3 in 1 bedside comode   OT Evaluation Precautions/Restrictions  Precautions Precautions: Shoulder;Fall Type of Shoulder Precautions: L UE avoid passive and active shoulder ROM x 2 weeks, OK unrestricted elbow/wrist ROM Shoulder Interventions: Shoulder sling/immobilizer Precaution Booklet Issued: No Precaution Comments: R UE unrestricted ROM, L LE OK for for 0 to 60 degrees knee flexion in hinged brace Required Braces or Orthoses: Sling;Other Brace Other Brace: L LE hinged knee brace Restrictions Weight Bearing Restrictions Per Provider Order: Yes RUE Weight Bearing Per Provider Order: Non weight bearing LUE Weight Bearing Per Provider Order: Non weight bearing LLE Weight Bearing Per Provider Order: Weight bearing as tolerated Other Position/Activity Restrictions: L UE sling and no shoulder ROM, R UE ROM unrestricted; playmakers brace 0-60 L knee General Chart Reviewed: Yes Family/Caregiver Present: No Vital Signs Therapy Vitals Temp: (!) 97.5 F (36.4 C) Pulse Rate: 95 Resp: 16 BP: 122/67 Patient Position (if appropriate): Lying Oxygen Therapy SpO2: 95 % O2 Device: Room Air Pain Pain Assessment Pain Scale:  (2-3 on 0-10 for L shld and R arm) Pain Score: 3  Pain Type: Acute pain Pain Location: Arm Pain Orientation: Right;Left Home Living/Prior Functioning Home Living Family/patient expects to be discharged to:: Private residence Living Arrangements: Children Available Help at Discharge: Family Type of Home: House Home Access: Stairs to enter (2 stairs at front on the home leading to the sunroom and 1 step into the main living area) Secretary/administrator of Steps: 2 Entrance Stairs-Rails: None Home Layout: One  level Bathroom Shower/Tub: Walk-in shower (2 restrooms, she uses the one with the shower and shower bench.) Additional Comments: she has Sports administrator, tub bench and raised commode  Lives With: Family, Daughter IADL History Homemaking Responsibilities:  (family support) Prior Function Level of Independence: Independent with gait, Independent with transfers  Able to Take Stairs?: Reciprically Driving: Yes Vocation: Retired Administrator, sports Baseline Vision/History: 4 Cataracts;1 Wears glasses (patient indicate that she wear reader) Ability to See in Adequate Light: 0 Adequate Patient Visual Report: No change from baseline Perception  Perception: Within Functional Limits Praxis   Cognition Cognition Overall Cognitive Status: Within Functional Limits for tasks assessed Arousal/Alertness: Awake/alert Memory: Appears intact Awareness: Appears intact Problem Solving: Appears intact Safety/Judgment: Appears intact Brief Interview for Mental Status (BIMS) Repetition of Three Words (First Attempt): 3 Temporal Orientation: Year: Correct Temporal Orientation: Month: Accurate within 5 days Temporal Orientation: Day: Correct Recall: "Sock": Yes, no cue required Recall: "Blue": Yes, no cue required Recall: "Bed": Yes, no cue required BIMS Summary Score: 15 Sensation Sensation Light Touch: Impaired Detail Peripheral sensation comments: impaired light touch B feet Light Touch Impaired Details: Impaired LLE;Impaired RLE Coordination Gross Motor Movements are Fluid and Coordinated: No Fine Motor Movements are Fluid and Coordinated: No (patient presents a NWB with BUE) Motor  Motor Motor: Within Functional Limits Motor - Skilled Clinical Observations: generalized weakness  Trunk/Postural Assessment  Cervical Assessment Cervical Assessment: Exceptions to [facility] Thoracic Assessment Thoracic Assessment: Exceptions to [facility] Lumbar Assessment Lumbar Assessment: Exceptions to [facility] Postural Control Postural  Control: Within Functional Limits  Balance Static Sitting Balance Static Sitting - Balance Support: Feet supported Static Sitting - Level of Assistance: 5: Stand by assistance Dynamic Sitting Balance Sitting balance - Comments: EOB Static Standing Balance Static Standing - Balance Support: During functional activity;No upper extremity supported Static Standing - Level of  Assistance: 4: Min assist Extremity/Trunk Assessment RUE Assessment Active Range of Motion (AROM) Comments: non restrictive AROM with RUE General Strength Comments: 3-/5 MMT LUE Assessment Active Range of Motion (AROM) Comments: Maintain LUE in sling at all times General Strength Comments: 3-/5 MMT  Care Tool Care Tool Self Care Eating   Eating Assist Level: Set up assist    Oral Care    Oral Care Assist Level: Set up assist    Bathing   Body parts bathed by patient: Left arm;Chest;Abdomen;Face Body parts bathed by helper: Right arm;Front perineal area;Buttocks;Right upper leg;Left upper leg;Left lower leg;Right lower leg   Assist Level: Maximal Assistance - Patient 24 - 49%    Upper Body Dressing(including orthotics)   What is the patient wearing?: Hospital gown only   Assist Level: Moderate Assistance - Patient 50 - 74%    Lower Body Dressing (excluding footwear)   What is the patient wearing?: Hospital gown only Assist for lower body dressing: Maximal Assistance - Patient 25 - 49% (MaxA to Dep)    Putting on/Taking off footwear     Assist for footwear: Total Assistance - Patient < 25%       Care Tool Toileting Toileting activity   Assist for toileting: Dependent - Patient 0%     Care Tool Bed Mobility Roll left and right activity Roll left and right activity did not occur: Safety/medical concerns      Sit to lying activity   Sit to lying assist level: Moderate Assistance - Patient 50 - 74%    Lying to sitting on side of bed activity   Lying to sitting on side of bed assist level: the  ability to move from lying on the back to sitting on the side of the bed with no back support.: Moderate Assistance - Patient 50 - 74%     Care Tool Transfers Sit to stand transfer   Sit to stand assist level: Moderate Assistance - Patient 50 - 74%    Chair/bed transfer   Chair/bed transfer assist level: Moderate Assistance - Patient 50 - 74%     Toilet transfer   Assist Level: Moderate Assistance - Patient 50 - 74%     Care Tool Cognition  Expression of Ideas and Wants Expression of Ideas and Wants: 4. Without difficulty (complex and basic) - expresses complex messages without difficulty and with speech that is clear and easy to understand  Understanding Verbal and Non-Verbal Content Understanding Verbal and Non-Verbal Content: 4. Understands (complex and basic) - clear comprehension without cues or repetitions   Memory/Recall Ability Memory/Recall Ability : Current season;Location of own room;That he or she is in a hospital/hospital unit   Refer to Care Plan for Long Term Goals  SHORT TERM GOAL WEEK 1 OT Short Term Goal 1 (Week 1): The pt will complete UB BADL task  in bathing/dress with  CGA/MinA at 95% safe using modified technique OT Short Term Goal 2 (Week 1): The pt will complete LB BADL in bathing/dressing with Mod/ MinA at 95% safe using AE at 95% safe OT Short Term Goal 3 (Week 1): The pt will tolerate 45 minutes of activity @95 % safe with intermittent rest breaks. OT Short Term Goal 4 (Week 1): The pt will improve core strength to 3+/5MMT at 95% safe for gains in functional performance.  Recommendations for other services: None    Skilled Therapeutic Intervention  Patient seen this day for OT assessment. Patient presents with a diagnosis of :Critical polytrauma impracting the way  she approaches her natural environment. The pt presents as Mod/MaxA for UB function secondary to NWB status and she is MaxA to Dep  with LB task performance in relation to BADL related task  performance. The pt worked on bathing, dressing, and toileting and would benefit from continued instruction in task modification as well as with AE to improve her functional independence for a safe and independent return home and to reduce the burden of care for the care provider.  The pt is recommended for OT services for 12-14 weeks to improve upon goal attainment .  The pt is in agreement with a POC focused on funcitonal balance, core strength, toileting UB/LB BADL related task, activity tolerance, and instruction in AE as needed.  Modification in the POC to be made as needed. Patient to receive OT 12-14 wks in duration. .  ADL ADL Equipment Provided:  (none) Eating: Set up Where Assessed-Eating:  (based on observation) Grooming: Setup Where Assessed-Grooming: Sitting at sink Upper Body Bathing: Minimal assistance;Moderate assistance (secondary to patient understading of NWB) Where Assessed-Upper Body Bathing: Sitting at sink Lower Body Bathing: Maximal assistance;Dependent Where Assessed-Lower Body Bathing: Sitting at sink Upper Body Dressing: Moderate assistance Where Assessed-Upper Body Dressing: Sitting at sink Lower Body Dressing: Maximal assistance;Dependent Where Assessed-Lower Body Dressing: Sitting at sink Toileting: Dependent Where Assessed-Toileting: Teacher, adult education: Curator Method: Stand Wellsite geologist: Bedside commode (over the commode) Tub/Shower Transfer: Moderate assistance;Maximal assistance (based on performance.) Tub/Shower Transfer Method: Sit pivot Film/video editor: Minimal assistance (based on performance) Film/video editor Method: Warden/ranger: Shower seat with back (based on performance.) Mobility  Bed Mobility Bed Mobility: Supine to Sit;Sit to Supine Supine to Sit: Moderate Assistance - Patient 50-74%;Minimal Assistance - Patient > 75% Sit to Supine: Minimal Assistance -  Patient > 75%;Moderate Assistance - Patient 50-74% Transfers Sit to Stand: Moderate Assistance - Patient 50-74%;Minimal Assistance - Patient > 75% Stand to Sit: Minimal Assistance - Patient > 75%;Moderate Assistance - Patient 50-74%   Discharge Criteria: Patient will be discharged from OT if patient refuses treatment 3 consecutive times without medical reason, if treatment goals not met, if there is a change in medical status, if patient makes no progress towards goals or if patient is discharged from hospital.  The above assessment, treatment plan, treatment alternatives and goals were discussed and mutually agreed upon: by patient  Lavona Mound 05/03/2023, 6:04 PM

## 2023-05-03 NOTE — Progress Notes (Signed)
PROGRESS NOTE   Subjective/Complaints:   Pt doing well today, working with PT this morning. Slept fine. Pain doing ok, doesn't really need pain meds, does well with using tylenol and ativan. States she takes ativan regularly at home-- sees psych as well. LBM last night and this morning! Urinating fine. Denies any other complaints or concerns today.    ROS: as per HPI. Denies CP, SOB, abd pain, N/V/D/C, or any other complaints at this time.    Objective:   No results found. No results for input(s): "WBC", "HGB", "HCT", "PLT" in the last 72 hours. No results for input(s): "NA", "K", "CL", "CO2", "GLUCOSE", "BUN", "CREATININE", "CALCIUM" in the last 72 hours.    Intake/Output Summary (Last 24 hours) at 05/03/2023 1039 Last data filed at 05/03/2023 0700 Gross per 24 hour  Intake 354 ml  Output --  Net 354 ml        Physical Exam: Vital Signs Blood pressure (!) 154/83, pulse 87, temperature 98.4 F (36.9 C), resp. rate 18, height 5\' 9"  (1.753 m), weight 100.1 kg, SpO2 96%.  Constitutional:      Appearance: Normal appearance. She is obese.     Comments: sitting up in bed with PT at bedside, wearing sling on LUE, awake, alert, appropriate, NAD  HENT:     Head: Normocephalic and atraumatic.     Comments: A little facial bruising- no facial asymmetry. R eyebrow sutured and healing well    Right Ear: External ear normal.     Left Ear: External ear normal.     Nose: Nose normal. No congestion.     Mouth/Throat:     Mouth: Mucous membranes are dry.     Pharynx: Oropharynx is clear. No oropharyngeal exudate.  Eyes:     General:        Right eye: No discharge.        Left eye: No discharge.     Extraocular Movements: Extraocular movements intact.  Cardiovascular:     Rate and Rhythm: Normal rate and regular rhythm.     Heart sounds: Normal heart sounds. No murmur heard.    No gallop.     Comments: RRR no m/r/g  appreciated Pulmonary:     Effort: Pulmonary effort is normal. No respiratory distress.     Breath sounds: Normal breath sounds. No wheezing, rhonchi or rales.  Abdominal:     General: Bowel sounds are normal. There is no distension.     Palpations: Abdomen is soft.     Tenderness: There is no abdominal tenderness.  Skin:    General: Skin is warm and dry.     Comments: Extensive purple/yellow bruising on B/L Ue's Dressing R bicep- down entire bicep- looks C/D/I KI on LLE-   Neurological:     Mental Status: She is alert.     Comments: Patient is alert no acute distress oriented x 3 and follows commands. Intact to light touch in all 4 extremities  Psychiatric:        Mood and Affect: Mood normal.        Behavior: Behavior normal.     Comments: anxious  PRIOR EXAMS: Musculoskeletal:  Cervical back: Neck supple. No tenderness.     Comments: Cannot test deltoid or biceps B/L- due to humeral fx's or triceps WE/Grip and FA 5-/5 B/L  LE's- HF 4+/5; KE on R 4+/5; NT on L due to KI; DF/PF 5/5 B/L Wearing sling on LUE- because not allowed to move L shoulder at all- and KI on LLE      Assessment/Plan: 1. Functional deficits which require 3+ hours per day of interdisciplinary therapy in a comprehensive inpatient rehab setting. Physiatrist is providing close team supervision and 24 hour management of active medical problems listed below. Physiatrist and rehab team continue to assess barriers to discharge/monitor patient progress toward functional and medical goals  Care Tool:  Bathing              Bathing assist       Upper Body Dressing/Undressing Upper body dressing        Upper body assist      Lower Body Dressing/Undressing Lower body dressing            Lower body assist       Toileting Toileting    Toileting assist       Transfers Chair/bed transfer  Transfers assist           Locomotion Ambulation   Ambulation assist               Walk 10 feet activity   Assist           Walk 50 feet activity   Assist           Walk 150 feet activity   Assist           Walk 10 feet on uneven surface  activity   Assist           Wheelchair     Assist               Wheelchair 50 feet with 2 turns activity    Assist            Wheelchair 150 feet activity     Assist          Blood pressure (!) 154/83, pulse 87, temperature 98.4 F (36.9 C), resp. rate 18, height 5\' 9"  (1.753 m), weight 100.1 kg, SpO2 96%.  Medical Problem List and Plan: 1. Functional deficits secondary to polytrauma after fall 2012 12/06/2022 -patient may  shower- cover KI and her incisions and L sling- if can take shower, then able to             -ELOS/Goals: 14-16 days- min A -Continue CIR -NWB B/L UE's- Sling in LUE with no Shoulder ROM for 2 weeks at all -RUE- ROM as tolerated -KI with WBAT on LLE 2.  Antithrombotics: -DVT/anticoagulation:  Pharmaceutical: Lovenox 40mg  daily check vascular study             -antiplatelet therapy: N/A 3. Pain Management: Voltaren 75 mg p.o. twice daily, Robaxin and hydrocodone as needed, Advil 400 mg every 6 hours as needed -05/03/23 voltaren not on orders, but pt doing fairly well with just tylenol and ativan-- monitor needs, may be able to clean up her pain regimen this week 4. Mood/Behavior/Sleep: Lexapro 20 mg daily, Wellbutrin 300 mg daily, Ativan 1 mg twice daily as needed             -antipsychotic agents: N/A 5. Neuropsych/cognition: This patient is capable of making decisions on her own behalf. 6. Skin/Wound Care:  Routine skin checks. R eyebrow sutured 12/7, likely ready to come out 12/17 or sooner 7. Fluids/Electrolytes/Nutrition: Routine in and outs with follow-up chemistries, continue vitamins 8.  Fracture involving the distal diaphysis of the right humerus.  Status post ORIF of humeral shaft fracture 04/27/2023.  Nonweightbearing, ROM as tolerated 9.   Comminuted mildly displaced fracture of the left humeral head.  Nonoperative.  Nonweightbearing.  Shoulder sling at all times, no shoulder ROM for 2wks. 10.  Nondisplaced transverse fracture left patella.  Weightbearing as tolerated..  Knee immobilizer at all times 11.  Acute blood loss anemia.  Follow-up CBC.  Continue iron supplement 12.  Hypertension.  HCTZ 12.5 mg daily, Cozaar 50 mg daily.  Monitor with increased mobility  -05/03/23 BPs a little up but monitor trend before adjustments made Vitals:   05/02/23 1510 05/02/23 2036 05/03/23 0325  BP: (!) 140/69 125/72 (!) 154/83    13.  Constipation.  Colace 100 mg twice daily, MiraLAX daily.  Senokot S 2 tabs nightly. Dulcolax suppository as needed  -05/03/23 LBM last night and this AM, monitor 14.  Hyperlipidemia.  Pravachol 20mg  daily 15.  GERD.  Protonix 40mg  daily.     LOS: 1 days A FACE TO FACE EVALUATION WAS PERFORMED  [address] 05/03/2023, 10:39 AM

## 2023-05-03 NOTE — Progress Notes (Signed)
VASCULAR LAB    Bilateral lower extremity venous duplex has been performed.  See CV proc for preliminary results.   Mikayla Chiusano, RVT 05/03/2023, 12:07 PM

## 2023-05-03 NOTE — Progress Notes (Signed)
Occupational Therapy Session Note  Patient Details  Name: Morgan Potter MRN: [id number] Date of Birth: 1950/05/22  {CHL IP REHAB OT TIME CALCULATIONS:[id number]}   Short Term Goals: Week 1:  OT Short Term Goal 1 (Week 1): The pt will complete UB BADL task  in bathing/dress with  CGA/MinA at 95% safe using modified technique OT Short Term Goal 2 (Week 1): The pt will complete LB BADL in bathing/dressing with Mod/ MinA at 95% safe using AE at 95% safe OT Short Term Goal 3 (Week 1): The pt will tolerate 45 minutes of activity @95 % safe with intermittent rest breaks. OT Short Term Goal 4 (Week 1): The pt will improve core strength to 3+/5MMT at 95% safe for gains in functional performance.  Skilled Therapeutic Interventions/Progress Updates:  Patient agreeable to participate in OT session. Reports *** pain level.   Patient participated in skilled OT session focusing on ***. Therapist facilitated/assessed/developed/educated/integrated/elicited *** in order to improve/facilitate/promote    Therapy Documentation Precautions:  Precautions Precautions: Shoulder, Fall Type of Shoulder Precautions: L UE avoid passive and active shoulder ROM x 2 weeks, OK unrestricted elbow/wrist ROM Shoulder Interventions: Shoulder sling/immobilizer Precaution Booklet Issued: No Precaution Comments: R UE unrestricted ROM, L LE OK for for 0 to 60 degrees knee flexion in hinged brace Required Braces or Orthoses: Sling, Other Brace Other Brace: L LE hinged knee brace Restrictions Weight Bearing Restrictions Per Provider Order: Yes RUE Weight Bearing Per Provider Order: Non weight bearing LUE Weight Bearing Per Provider Order: Non weight bearing LLE Weight Bearing Per Provider Order: Weight bearing as tolerated Other Position/Activity Restrictions: L UE sling and no shoulder ROM, R UE ROM unrestricted; playmakers brace 0-60 L knee  Therapy/Group: Individual Therapy  Limmie Patricia, OTR/L,CBIS  Supplemental  OT - [facility] and [facility] Secure Chat Preferred   05/03/2023, 6:56 PM

## 2023-05-03 NOTE — Discharge Summary (Signed)
Physician Discharge Summary  Patient ID: Morgan Potter MRN: [id number] DOB/AGE: [birth year] 72 y.o.  Admit date: 05/02/2023 Discharge date: 05/11/2024  Discharge Diagnoses:  Principal Problem:   Critical polytrauma DVT prophylaxis Pain management Fracture involving the distal diaphysis of the right humerus Comminuted mildly displaced fracture of left humeral head Nondisplaced transverse fracture left patella Acute blood loss anemia Hypertension Constipation Hyperlipidemia GERD Anxiety  Discharged Condition: Stable  Significant Diagnostic Studies: VAS [place] LOWER EXTREMITY VENOUS (DVT) Result Date: 05/04/2023  Lower Venous DVT Study Patient Name:  Morgan Potter  Date of Exam:   05/03/2023 Medical Rec #: [id number]       Accession #:    [id number] Date of Birth: 08-24-1950        Patient Gender: F Patient Age:   72 years Exam Location:  [facility] Procedure:      VAS [place] LOWER EXTREMITY VENOUS (DVT) Referring Phys: Mariam Dollar --------------------------------------------------------------------------------  Indications: Status post fall resulting in acute fracture of the right humerus, fracture of the left humeral head, and nondisplaced transverse fracture of the left patella.  Risk Factors: Immobility. Limitations: Orthopaedic appliance. Comparison Study: No prior study on file Performing Technologist: Sherren Kerns RVS  Examination Guidelines: A complete evaluation includes B-mode imaging, spectral Doppler, color Doppler, and power Doppler as needed of all accessible portions of each vessel. Bilateral testing is considered an integral part of a complete examination. Limited examinations for reoccurring indications may be performed as noted. The reflux portion of the exam is performed with the patient in reverse Trendelenburg.  +---------+---------------+---------+-----------+----------+--------------+ RIGHT    CompressibilityPhasicitySpontaneityPropertiesThrombus Aging  +---------+---------------+---------+-----------+----------+--------------+ CFV      Full           Yes      Yes                                 +---------+---------------+---------+-----------+----------+--------------+ SFJ      Full                                                        +---------+---------------+---------+-----------+----------+--------------+ FV Prox  Full                                                        +---------+---------------+---------+-----------+----------+--------------+ FV Mid   Full                                                        +---------+---------------+---------+-----------+----------+--------------+ FV Distal               Yes      Yes                                 +---------+---------------+---------+-----------+----------+--------------+ PFV      Full                                                        +---------+---------------+---------+-----------+----------+--------------+  POP      Full           Yes      Yes                                 +---------+---------------+---------+-----------+----------+--------------+ PTV      Full                                                        +---------+---------------+---------+-----------+----------+--------------+ PERO     Full                                                        +---------+---------------+---------+-----------+----------+--------------+   +---------+---------------+---------+-----------+----------+--------------+ LEFT     CompressibilityPhasicitySpontaneityPropertiesThrombus Aging +---------+---------------+---------+-----------+----------+--------------+ CFV      Full           Yes      Yes                                 +---------+---------------+---------+-----------+----------+--------------+ SFJ      Full                                                         +---------+---------------+---------+-----------+----------+--------------+ FV Prox  Full                                                        +---------+---------------+---------+-----------+----------+--------------+ FV Mid   Full                                                        +---------+---------------+---------+-----------+----------+--------------+ FV DistalFull                                                        +---------+---------------+---------+-----------+----------+--------------+ PFV      Full                                                        +---------+---------------+---------+-----------+----------+--------------+ POP      Full           Yes      Yes                                 +---------+---------------+---------+-----------+----------+--------------+  PTV      Full                                                        +---------+---------------+---------+-----------+----------+--------------+ PERO     Full                                                        +---------+---------------+---------+-----------+----------+--------------+ Gastroc  Full                                                        +---------+---------------+---------+-----------+----------+--------------+     Summary: RIGHT: - There is no evidence of deep vein thrombosis in the lower extremity.  - No cystic structure found in the popliteal fossa.  LEFT: - There is no evidence of deep vein thrombosis in the lower extremity. However, portions of this examination were limited- see technologist comments above.  - No cystic structure found in the popliteal fossa.  *See table(s) above for measurements and observations. Electronically signed by Heath Lark on 05/04/2023 at 7:34:34 AM.    Final    DG Humerus Right Result Date: 04/28/2023 CLINICAL DATA:  [id number] Humerus fracture [id number] EXAM: RIGHT HUMERUS - 2+ VIEW COMPARISON:  the previous day's study  FINDINGS: Plate and screw fixation of the distal humeral shaft fracture, fragments in near anatomic alignment. Advanced degenerative changes of the glenohumeral articulation as before. IMPRESSION: ORIF distal humeral shaft fracture. Electronically Signed   By: Corlis Leak M.D.   On: 04/28/2023 13:09   DG Humerus Right Result Date: 04/27/2023 CLINICAL DATA:  ORIF of the right humeral fracture. EXAM: RIGHT HUMERUS - 2+ VIEW COMPARISON:  Radiograph dated 04/25/2023 FINDINGS: Three intraoperative fluoroscopic spot images provided. The total fluoroscopic time is 45.5 seconds with cumulative air kerma of 1.68 mGy. Fixation plate and screws noted. IMPRESSION: Intraoperative fluoroscopic spot images. Electronically Signed   By: Elgie Collard M.D.   On: 04/27/2023 18:04   DG C-Arm 1-60 Min-No Report Result Date: 04/27/2023 Fluoroscopy was utilized by the requesting physician.  No radiographic interpretation.   DG C-Arm 1-60 Min-No Report Result Date: 04/27/2023 Fluoroscopy was utilized by the requesting physician.  No radiographic interpretation.   CT SHOULDER LEFT WO CONTRAST Result Date: 04/27/2023 CLINICAL DATA:  Status post fall.  Left humeral neck fracture. EXAM: CT OF THE UPPER LEFT EXTREMITY WITHOUT CONTRAST TECHNIQUE: Multidetector CT imaging of the left shoulder was performed according to the standard protocol. RADIATION DOSE REDUCTION: This exam was performed according to the departmental dose-optimization program which includes automated exposure control, adjustment of the mA and/or kV according to patient size and/or use of iterative reconstruction technique. COMPARISON:  Left shoulder radiographs 04/25/2023 FINDINGS: Bones/Joint/Cartilage Acute fracture of the left humeral neck is mildly comminuted and mildly displaced. There is a 3.4 cm fragment involving the greater tuberosity which is mildly displaced posterolaterally and superiorly. Fracture extends into the lesser tuberosity. There is no  involvement of the humeral head articular surface. Mild underlying glenohumeral degenerative changes are  present with a small shoulder joint effusion. There are mild acromioclavicular degenerative changes. The left scapula and clavicle appear intact. Ligaments Suboptimally assessed by CT. Muscles and Tendons No evidence of focal rotator cuff muscular atrophy or hematoma. There is some edema anteriorly in the deltoid muscle. No gross rotator cuff impingement. Soft tissues No evidence of periarticular hematoma, foreign body or soft tissue emphysema. There is moderate subcutaneous edema surrounding the shoulder, greatest anteriorly and superiorly. IMPRESSION: 1. Acute mildly comminuted and mildly displaced fracture of the left humeral neck as described. No involvement of the humeral head articular surface. 2. No evidence of focal periarticular hematoma or soft tissue emphysema. 3. Mild underlying glenohumeral and acromioclavicular degenerative changes. 4. No significant focal rotator cuff muscular atrophy. Electronically Signed   By: Carey Bullocks M.D.   On: 04/27/2023 13:47   DG Humerus Right Result Date: 04/25/2023 CLINICAL DATA:  Fracture EXAM: RIGHT HUMERUS - 2+ VIEW COMPARISON:  04/25/2023 FINDINGS: Frontal and lateral views of the right humerus are obtained. Casting material obscures underlying bony detail. The oblique distal right humeral diaphyseal fracture is again noted, with near anatomic alignment after reduction. Stable chronic posttraumatic and degenerative changes of the right shoulder. There is diffuse soft tissue swelling. IMPRESSION: 1. Interval reduction of the right humeral diaphyseal fracture, with near anatomic alignment. Electronically Signed   By: Sharlet Salina M.D.   On: 04/25/2023 21:20   CT SHOULDER RIGHT WO CONTRAST Result Date: 04/25/2023 CLINICAL DATA:  Assess for fracture. EXAM: CT OF THE UPPER RIGHT EXTREMITY WITHOUT CONTRAST TECHNIQUE: Multidetector CT imaging of the upper  right extremity was performed according to the standard protocol. RADIATION DOSE REDUCTION: This exam was performed according to the departmental dose-optimization program which includes automated exposure control, adjustment of the mA and/or kV according to patient size and/or use of iterative reconstruction technique. COMPARISON:  Current right shoulder radiographs. FINDINGS: Bones/Joint/Cartilage No acute fracture. Significant deformity of the right humeral head. Findings are suspected to be an old, healed fracture. There is secondary osteoarthritis with marked narrowing of the inferior glenohumeral joint space and glenoid marginal spurring. Humeral articular surface is irregular. Mild AC joint osteoarthritis.  AC joint normally aligned. No bone lesion. Ligaments Suboptimally assessed by CT. Muscles and Tendons No evidence of muscle injury or atrophy. Tendons are grossly intact. Soft tissues No soft tissue edema or hemorrhage.  No mass. IMPRESSION: 1. No acute fracture. 2. Significant deformity of the right humeral head, suspected to be an old, healed fracture. Secondary osteoarthritis of the glenohumeral joint. Electronically Signed   By: Amie Portland M.D.   On: 04/25/2023 18:05   CT Head Wo Contrast Result Date: 04/25/2023 CLINICAL DATA:  Head trauma, moderate-severe; Neck trauma (Age >= 65y) EXAM: CT HEAD WITHOUT CONTRAST CT CERVICAL SPINE WITHOUT CONTRAST TECHNIQUE: Multidetector CT imaging of the head and cervical spine was performed following the standard protocol without intravenous contrast. Multiplanar CT image reconstructions of the cervical spine were also generated. RADIATION DOSE REDUCTION: This exam was performed according to the departmental dose-optimization program which includes automated exposure control, adjustment of the mA and/or kV according to patient size and/or use of iterative reconstruction technique. COMPARISON:  None Available. FINDINGS: CT HEAD FINDINGS Brain: No hemorrhage. No  hydrocephalus. No extra-axial fluid collection. No CT evidence of an acute cortical infarct. No mass effect. No mass lesion. Vascular: No hyperdense vessel or unexpected calcification. Skull: Normal. Negative for fracture or focal lesion. Sinuses/Orbits: No middle ear or mastoid effusion. Paranasal sinuses are clear. Right  lens replacement. Orbits are otherwise unremarkable. Other: None. CT CERVICAL SPINE FINDINGS Alignment: Normal. Skull base and vertebrae: Chronic compression deformity of the superior endplate of T1. 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 no acute cervical spine fracture Soft tissues and spinal canal: No prevertebral fluid or swelling. No visible canal hematoma. Disc levels:  No CT evidence of high-grade spinal canal stenosis. Upper chest: Negative. Other: None IMPRESSION: 1. No CT evidence of intracranial injury. 2. No acute cervical spine fracture. 3. Chronic compression deformity of the superior endplate of T1. Electronically Signed   By: Lorenza Cambridge M.D.   On: 04/25/2023 12:57   CT Cervical Spine Wo Contrast Result Date: 04/25/2023 CLINICAL DATA:  Head trauma, moderate-severe; Neck trauma (Age >= 65y) EXAM: CT HEAD WITHOUT CONTRAST CT CERVICAL SPINE WITHOUT CONTRAST TECHNIQUE: Multidetector CT imaging of the head and cervical spine was performed following the standard protocol without intravenous contrast. Multiplanar CT image reconstructions of the cervical spine were also generated. RADIATION DOSE REDUCTION: This exam was performed according to the departmental dose-optimization program which includes automated exposure control, adjustment of the mA and/or kV according to patient size and/or use of iterative reconstruction technique. COMPARISON:  None Available. FINDINGS: CT HEAD FINDINGS Brain: No hemorrhage. No hydrocephalus. No extra-axial fluid collection. No CT evidence of an acute cortical infarct. No mass effect. No mass lesion. Vascular: No hyperdense vessel or unexpected calcification.  Skull: Normal. Negative for fracture or focal lesion. Sinuses/Orbits: No middle ear or mastoid effusion. Paranasal sinuses are clear. Right lens replacement. Orbits are otherwise unremarkable. Other: None. CT CERVICAL SPINE FINDINGS Alignment: Normal. Skull base and vertebrae: Chronic compression deformity of the superior endplate of T1. 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 no acute cervical spine fracture Soft tissues and spinal canal: No prevertebral fluid or swelling. No visible canal hematoma. Disc levels:  No CT evidence of high-grade spinal canal stenosis. Upper chest: Negative. Other: None IMPRESSION: 1. No CT evidence of intracranial injury. 2. No acute cervical spine fracture. 3. Chronic compression deformity of the superior endplate of T1. Electronically Signed   By: Lorenza Cambridge M.D.   On: 04/25/2023 12:57   DG Pelvis 1-2 Views Result Date: 04/25/2023 CLINICAL DATA:  Status post fall. Facial laceration with bilateral arm and knee pain. EXAM: LEFT KNEE - COMPLETE 4+ VIEW; PELVIS - 1-2 VIEW; RIGHT KNEE - COMPLETE 4+ VIEW; LEFT SHOULDER - 2+ VIEW; RIGHT SHOULDER - 2+ VIEW COMPARISON:  None relevant.  Concurrent chest radiographs. FINDINGS: Right shoulder: Right humeral radiographs are dictated separately. These better demonstrate an acute angulated and mildly comminuted fracture of the distal 3rd of the right humeral diaphysis. The right humeral head is abnormal with fragmentation, irregular sclerosis and possible anterior inferior dislocation. These findings are not favored to be acute and may reflect the sequela of an old or subacute injury. Correlate clinically. The right scapula appears intact. Left shoulder: There is a comminuted and mildly displaced acute fracture of the left humeral neck. The greater tuberosity is mildly displaced. Mild underlying glenohumeral and acromioclavicular degenerative changes. No dislocation. One-view pelvis: 1208 hours. The bones appear mildly demineralized. No evidence of acute  fracture or dislocation. Mild degenerative changes of both hips and sacroiliac joints. Surgical clips overlie the right sacroiliac joint. Right knee: The mineralization and alignment are normal. There is no evidence of acute fracture or dislocation. Mild tricompartmental degenerative changes. 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 No significant knee joint effusion or other focal soft tissue abnormality. Left knee: The mineralization and alignment are normal. There is suspicion  of a nondisplaced transverse fracture of the patella, best seen on the externally rotated oblique view. This finding is not well seen in the other projections. No other evidence of acute fracture or dislocation. There are tricompartmental degenerative changes with meniscal chondrocalcinosis and a small knee joint effusion. IMPRESSION: 1. Acute angulated and mildly comminuted fracture of the distal 3rd of the right humeral diaphysis. See separate report for right humerus. 2. Suspected chronic fracture of the right humeral head with fragmentation and possible anterior inferior dislocation. 3. Comminuted and mildly displaced acute fracture of the left humeral neck. 4. No evidence of acute pelvic fracture. 5. Suspected nondisplaced transverse fracture of the left patella. Correlate clinically. No other evidence of acute fracture or dislocation in either knee. 6. 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 Electronically Signed   By: Carey Bullocks M.D.   On: 04/25/2023 12:56   DG Shoulder Right Result Date: 04/25/2023 CLINICAL DATA:  Status post fall. Facial laceration with bilateral arm and knee pain. EXAM: LEFT KNEE - COMPLETE 4+ VIEW; PELVIS - 1-2 VIEW; RIGHT KNEE - COMPLETE 4+ VIEW; LEFT SHOULDER - 2+ VIEW; RIGHT SHOULDER - 2+ VIEW COMPARISON:  None relevant.  Concurrent chest radiographs. FINDINGS: Right shoulder: Right humeral radiographs are dictated separately. These better demonstrate an acute angulated and  mildly comminuted fracture of the distal 3rd of the right humeral diaphysis. The right humeral head is abnormal with fragmentation, irregular sclerosis and possible anterior inferior dislocation. These findings are not favored to be acute and may reflect the sequela of an old or subacute injury. Correlate clinically. The right scapula appears intact. Left shoulder: There is a comminuted and mildly displaced acute fracture of the left humeral neck. The greater tuberosity is mildly displaced. Mild underlying glenohumeral and acromioclavicular degenerative changes. No dislocation. One-view pelvis: 1208 hours. The bones appear mildly demineralized. No evidence of acute fracture or dislocation. Mild degenerative changes of both hips and sacroiliac joints. Surgical clips overlie the right sacroiliac joint. Right knee: The mineralization and alignment are normal. There is no evidence of acute fracture or dislocation. Mild tricompartmental degenerative changes. 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 No significant knee joint effusion or other focal soft tissue abnormality. Left knee: The mineralization and alignment are normal. There is suspicion of a nondisplaced transverse fracture of the patella, best seen on the externally rotated oblique view. This finding is not well seen in the other projections. No other evidence of acute fracture or dislocation. There are tricompartmental degenerative changes with meniscal chondrocalcinosis and a small knee joint effusion. IMPRESSION: 1. Acute angulated and mildly comminuted fracture of the distal 3rd of the right humeral diaphysis. See separate report for right humerus. 2. Suspected chronic fracture of the right humeral head with fragmentation and possible anterior inferior dislocation. 3. Comminuted and mildly displaced acute fracture of the left humeral neck. 4. No evidence of acute pelvic fracture. 5. Suspected nondisplaced transverse fracture of the left patella. Correlate  clinically. No other evidence of acute fracture or dislocation in either knee. 6. 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 Electronically Signed   By: Carey Bullocks M.D.   On: 04/25/2023 12:56   DG Shoulder Left Result Date: 04/25/2023 CLINICAL DATA:  Status post fall. Facial laceration with bilateral arm and knee pain. EXAM: LEFT KNEE - COMPLETE 4+ VIEW; PELVIS - 1-2 VIEW; RIGHT KNEE - COMPLETE 4+ VIEW; LEFT SHOULDER - 2+ VIEW; RIGHT SHOULDER - 2+ VIEW COMPARISON:  None relevant.  Concurrent chest radiographs. FINDINGS: Right shoulder: Right humeral  radiographs are dictated separately. These better demonstrate an acute angulated and mildly comminuted fracture of the distal 3rd of the right humeral diaphysis. The right humeral head is abnormal with fragmentation, irregular sclerosis and possible anterior inferior dislocation. These findings are not favored to be acute and may reflect the sequela of an old or subacute injury. Correlate clinically. The right scapula appears intact. Left shoulder: There is a comminuted and mildly displaced acute fracture of the left humeral neck. The greater tuberosity is mildly displaced. Mild underlying glenohumeral and acromioclavicular degenerative changes. No dislocation. One-view pelvis: 1208 hours. The bones appear mildly demineralized. No evidence of acute fracture or dislocation. Mild degenerative changes of both hips and sacroiliac joints. Surgical clips overlie the right sacroiliac joint. Right knee: The mineralization and alignment are normal. There is no evidence of acute fracture or dislocation. Mild tricompartmental degenerative changes. 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 No significant knee joint effusion or other focal soft tissue abnormality. Left knee: The mineralization and alignment are normal. There is suspicion of a nondisplaced transverse fracture of the patella, best seen on the externally rotated oblique view. This finding is  not well seen in the other projections. No other evidence of acute fracture or dislocation. There are tricompartmental degenerative changes with meniscal chondrocalcinosis and a small knee joint effusion. IMPRESSION: 1. Acute angulated and mildly comminuted fracture of the distal 3rd of the right humeral diaphysis. See separate report for right humerus. 2. Suspected chronic fracture of the right humeral head with fragmentation and possible anterior inferior dislocation. 3. Comminuted and mildly displaced acute fracture of the left humeral neck. 4. No evidence of acute pelvic fracture. 5. Suspected nondisplaced transverse fracture of the left patella. Correlate clinically. No other evidence of acute fracture or dislocation in either knee. 6. 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 Electronically Signed   By: Carey Bullocks M.D.   On: 04/25/2023 12:56   DG Knee Complete 4 Views Right Result Date: 04/25/2023 CLINICAL DATA:  Status post fall. Facial laceration with bilateral arm and knee pain. EXAM: LEFT KNEE - COMPLETE 4+ VIEW; PELVIS - 1-2 VIEW; RIGHT KNEE - COMPLETE 4+ VIEW; LEFT SHOULDER - 2+ VIEW; RIGHT SHOULDER - 2+ VIEW COMPARISON:  None relevant.  Concurrent chest radiographs. FINDINGS: Right shoulder: Right humeral radiographs are dictated separately. These better demonstrate an acute angulated and mildly comminuted fracture of the distal 3rd of the right humeral diaphysis. The right humeral head is abnormal with fragmentation, irregular sclerosis and possible anterior inferior dislocation. These findings are not favored to be acute and may reflect the sequela of an old or subacute injury. Correlate clinically. The right scapula appears intact. Left shoulder: There is a comminuted and mildly displaced acute fracture of the left humeral neck. The greater tuberosity is mildly displaced. Mild underlying glenohumeral and acromioclavicular degenerative changes. No dislocation.  One-view pelvis: 1208 hours. The bones appear mildly demineralized. No evidence of acute fracture or dislocation. Mild degenerative changes of both hips and sacroiliac joints. Surgical clips overlie the right sacroiliac joint. Right knee: The mineralization and alignment are normal. There is no evidence of acute fracture or dislocation. Mild tricompartmental degenerative changes. 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 No significant knee joint effusion or other focal soft tissue abnormality. Left knee: The mineralization and alignment are normal. There is suspicion of a nondisplaced transverse fracture of the patella, best seen on the externally rotated oblique view. This finding is not well seen in the other projections. No other evidence of acute fracture or dislocation. There  are tricompartmental degenerative changes with meniscal chondrocalcinosis and a small knee joint effusion. IMPRESSION: 1. Acute angulated and mildly comminuted fracture of the distal 3rd of the right humeral diaphysis. See separate report for right humerus. 2. Suspected chronic fracture of the right humeral head with fragmentation and possible anterior inferior dislocation. 3. Comminuted and mildly displaced acute fracture of the left humeral neck. 4. No evidence of acute pelvic fracture. 5. Suspected nondisplaced transverse fracture of the left patella. Correlate clinically. No other evidence of acute fracture or dislocation in either knee. 6. 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 Electronically Signed   By: Carey Bullocks M.D.   On: 04/25/2023 12:56   DG Knee Complete 4 Views Left Result Date: 04/25/2023 CLINICAL DATA:  Status post fall. Facial laceration with bilateral arm and knee pain. EXAM: LEFT KNEE - COMPLETE 4+ VIEW; PELVIS - 1-2 VIEW; RIGHT KNEE - COMPLETE 4+ VIEW; LEFT SHOULDER - 2+ VIEW; RIGHT SHOULDER - 2+ VIEW COMPARISON:  None relevant.  Concurrent chest radiographs. FINDINGS: Right shoulder:  Right humeral radiographs are dictated separately. These better demonstrate an acute angulated and mildly comminuted fracture of the distal 3rd of the right humeral diaphysis. The right humeral head is abnormal with fragmentation, irregular sclerosis and possible anterior inferior dislocation. These findings are not favored to be acute and may reflect the sequela of an old or subacute injury. Correlate clinically. The right scapula appears intact. Left shoulder: There is a comminuted and mildly displaced acute fracture of the left humeral neck. The greater tuberosity is mildly displaced. Mild underlying glenohumeral and acromioclavicular degenerative changes. No dislocation. One-view pelvis: 1208 hours. The bones appear mildly demineralized. No evidence of acute fracture or dislocation. Mild degenerative changes of both hips and sacroiliac joints. Surgical clips overlie the right sacroiliac joint. Right knee: The mineralization and alignment are normal. There is no evidence of acute fracture or dislocation. Mild tricompartmental degenerative changes. 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 No significant knee joint effusion or other focal soft tissue abnormality. Left knee: The mineralization and alignment are normal. There is suspicion of a nondisplaced transverse fracture of the patella, best seen on the externally rotated oblique view. This finding is not well seen in the other projections. No other evidence of acute fracture or dislocation. There are tricompartmental degenerative changes with meniscal chondrocalcinosis and a small knee joint effusion. IMPRESSION: 1. Acute angulated and mildly comminuted fracture of the distal 3rd of the right humeral diaphysis. See separate report for right humerus. 2. Suspected chronic fracture of the right humeral head with fragmentation and possible anterior inferior dislocation. 3. Comminuted and mildly displaced acute fracture of the left humeral neck. 4. No evidence of acute  pelvic fracture. 5. Suspected nondisplaced transverse fracture of the left patella. Correlate clinically. No other evidence of acute fracture or dislocation in either knee. 6. 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 Electronically Signed   By: Carey Bullocks M.D.   On: 04/25/2023 12:56   DG Humerus Right Result Date: 04/25/2023 CLINICAL DATA:  Status post fall. EXAM: RIGHT HUMERUS - 2+ VIEW COMPARISON:  None Available. FINDINGS: There is an acute fracture deformity involving the distal diaphysis of the right humerus. There is proximal and medial displacement with medial angulation of the distal fracture fragments. Severe degenerative changes identified within the glenohumeral joint. IMPRESSION: Acute fracture deformity involves the distal diaphysis of the right humerus. Electronically Signed   By: Signa Kell M.D.   On: 04/25/2023 12:53   DG Chest Portable 1 View  Result Date: 04/25/2023 CLINICAL DATA:  Larey Seat at home. EXAM: PORTABLE CHEST 1 VIEW COMPARISON:  None Available. FINDINGS: Cardiac silhouette borderline enlarged. No mediastinal or hilar masses. Clear lungs. No gross pneumothorax or pleural effusion on this supine exam. Right glenohumeral joint space narrowing. Questionable fracture or chronic findings of the right humeral head. This will be further evaluated with dedicated radiographs. IMPRESSION: 1. No acute cardiopulmonary disease. Electronically Signed   By: Amie Portland M.D.   On: 04/25/2023 12:50    Labs:  Basic Metabolic Panel: Recent Labs  Lab 05/11/23 0516  NA 139  K 3.9  CL 106  CO2 22  GLUCOSE 108*  BUN 17  CREATININE 0.97  CALCIUM 9.1     CBC: Recent Labs  Lab 05/11/23 0516  WBC 6.0  HGB 10.2*  HCT 31.5*  MCV 92.9  PLT 553*     CBG: No results for input(s): "GLUCAP" in the last 168 hours.  Family history.  Mother with arrhythmia father with myocardial infarction.  Denies any colon cancer esophageal cancer or rectal  cancer  Brief HPI:   Morgan Potter is a 72 y.o. right-handed female with history of anxiety, hypertension, GERD, hyperlipidemia.  Per chart review patient lives with family including 4 great-grandchildren.  Mobile home with 2 steps to entry.  Independent prior to admission.  She is Jehovah witness.  Presented 04/25/2023 after a fall when she was rushing out of her house with a coffee in her hand when she tripped over a pot and fell on her face.  She denied a loss of consciousness but was unable to get up and remained on the ground for approximately 15 minutes.  Admission chemistries unremarkable except WBC 12,500 CK1 128.  CT of the brain as well as cervical spine negative.  X-rays and imaging revealed acute fractures involving distal diaphysis of the right humerus, comminuted mildly displaced acute fracture of the left humeral head and nondisplaced transverse fracture of the left patella.  Underwent open reduction internal fixation of right humeral shaft fracture 04/27/2023 per Dr. Jena Gauss.  Nonoperative left upper extremity placed in a sling as well as left upper extremity knee brace at all times.  Nonweightbearing to right upper extremity shoulder sling immobilizer at all times and nonweightbearing left upper extremity.  Weightbearing as tolerated left lower extremity with knee immobilizer.  Acute blood loss anemia 9.3 and monitored.  Lovenox added for DVT prophylaxis.  Therapy evaluations completed due to patient decreased functional mobility was admitted for a comprehensive rehab program.   Hospital Course: Morgan Potter was admitted to rehab 05/02/2023 for inpatient therapies to consist of PT, ST and OT at least three hours five days a week. Past admission physiatrist, therapy team and rehab RN have worked together to provide customized collaborative inpatient rehab.  Pertaining to patient's polytrauma after fall 2012 12/06/2022.  Fracture involving distal diaphysis of the right humerus status post ORIF of  humeral shaft fracture 04/27/2023 nonweightbearing.  Comminuted mildly displaced fracture left humeral head nonoperative nonweightbearing shoulder sling at all times.  Nondisplaced transverse fracture left patella weightbearing as tolerated knee immobilizer at all times.  Maintain on Lovenox for DVT prophylaxis x 30 days venous Doppler studies negative and can transition to Eliquis if needed on discharge to simplify care.  Pain management with the use of tramadol for breakthrough pain.  Mood stabilization with Lexapro and Wellbutrin as advised emotional support provided and she was attending full therapies.  Acute blood loss anemia stable continue on iron  supplement no bleeding episodes.  Hypertension controlled with hydrochlorothiazide as well as Cozaar will need outpatient follow-up.  Pravachol ongoing for hyperlipidemia.  Protonix for GERD no nausea vomiting.  Bouts of constipation resolved with laxative assistance.   Blood pressures were monitored on TID basis and controlled     Rehab course: During patient's stay in rehab weekly team conferences were held to monitor patient's progress, set goals and discuss barriers to discharge. At admission, patient required minimal assist 80 feet without assistive device minimal assist step pivot transfers  Physical exam.  Blood pressure 128/68 pulse 96 temperature 97.9 respirations 18 oxygen saturation is 95% room air Constitutional.  No acute distress HEENT Head.  Normocephalic and atraumatic Eyes.  Pupils round and reactive to light no discharge without nystagmus Neck.  Supple nontender no JVD without thyromegaly Cardiac regular rate and rhythm without any extra sounds or murmur heard Abdomen.  Soft nontender positive bowel sounds without rebound Respiratory effort normal no respiratory distress without wheeze Musculoskeletal Comments.  Cannot test deltoid biceps B/L due to humeral fractures or triceps Wrist extension/grip and FA 5 -/5B/L Lower  extremities hip flexors 4+/5 KE on right 4+/5 NT on left due to KI, DF/PF 5/5B/L Wearing sling left upper extremity Neurologic.  Alert and oriented x 3  He/She  has had improvement in activity tolerance, balance, postural control as well as ability to compensate for deficits. He/She has had improvement in functional use RUE/LUE  and RLE/LLE as well as improvement in awareness.  Patient ambulated 200 feet sessions contact-guard no assistive device with Bledsoe brace and shoulder sling.  Navigates 3 inch steps with contact-guard.  Bed mobility without assistance.  Supine sitting edge of bed supervision.  Sit to stand from slightly elevated edge of bed without assistive device contact-guard.  She does maintain her nonweightbearing status bilateral upper extremities.  Seated at sink patient needing moderate assist for set up electric toothbrush and toothpaste with minimal assist for combing back of hair and donning and ponytail.       Disposition:  Discharge disposition: 06-Home-Health Care Svc        Diet: Regular  Special Instructions: No driving smoking or alcohol  Nonweightbearing bilateral upper extremities.  Weightbearing as tolerated left lower extremity with knee immobilizer.  Sling to left upper extremity no range of motion at the shoulder for 2 weeks  Medications at discharge. 1.  Tylenol as needed 2.  Wellbutrin 300 mg p.o. daily 3.  Vitamin D %000 units p.o. daily 4.  Senokot 2 tablets bedtime 5.  Lexapro 20 mg p.o. daily 6.  Ferrous sulfate 325 mg p.o. daily 7.  Hyzaar 50-12.5 mg daily 8.  Tramadol 50 mg every 6 hours as needed 9.  Advil 400 mg p.o. every 6 hours as needed 10.  Ativan 1 mg p.o. twice daily  11.  B complex with vitamin C daily 12.  Robaxin 500 mg every 6 hours as needed muscle spasms 13.  Singulair 10 mg p.o. daily 14.  Nexium 40 mg p.o. daily 15.  Pravachol 20 mg p.o. daily 16.  Thiamine 100 mg p.o. daily 17.  Eliquis 2.5 mg p.o. twice daily times  until 05/28/2023 and stop 18.  Flonase 1 spray into both nostrils daily  30-35 minutes were spent completing discharge summary and discharge planning   Allergies as of 05/12/2023       Reactions   Codeine    Morphine And Codeine    Sulfa Antibiotics  Medication List     STOP taking these medications    acetaminophen 500 MG tablet Commonly known as: TYLENOL   bisacodyl 10 MG suppository Commonly known as: DULCOLAX   Cholecalciferol 125 MCG (5000 UT) capsule Replaced by: Cholecalciferol 125 MCG (5000 UT) Tabs   diphenhydrAMINE 12.5 MG/5ML elixir Commonly known as: BENADRYL   oxyCODONE 5 MG immediate release tablet Commonly known as: Roxicodone   polyethylene glycol 17 g packet Commonly known as: MIRALAX / GLYCOLAX   thiamine 100 MG tablet Commonly known as: Vitamin B-1 Replaced by: thiamine 100 MG tablet       TAKE these medications    apixaban 2.5 MG Tabs tablet Commonly known as: ELIQUIS Continue Eliquis 2.5 mg by mouth twice daily through 05/28/2023 and stop   B-complex with vitamin C tablet Take 1 tablet by mouth daily.   buPROPion 300 MG 24 hr tablet Commonly known as: WELLBUTRIN XL Take 1 tablet (300 mg total) by mouth daily.   Cholecalciferol 125 MCG (5000 UT) Tabs Take 1 tablet (5,000 Units total) by mouth daily. Replaces: Cholecalciferol 125 MCG (5000 UT) capsule   escitalopram 20 MG tablet Commonly known as: LEXAPRO Take 1 tablet (20 mg total) by mouth daily.   esomeprazole 40 MG capsule Commonly known as: NEXIUM Take 1 capsule (40 mg total) by mouth daily.   ferrous sulfate 325 (65 FE) MG tablet Take 1 tablet (325 mg total) by mouth daily with breakfast.   fluticasone 50 MCG/ACT nasal spray Commonly known as: FLONASE Place 1 spray into both nostrils daily.   ibuprofen 400 MG tablet Commonly known as: ADVIL Take 1 tablet (400 mg total) by mouth every 6 (six) hours as needed (pain not relieved with tylenol).   LORazepam 1 MG  tablet Commonly known as: ATIVAN Take 1 tablet (1 mg total) by mouth 2 (two) times daily.   losartan-hydrochlorothiazide 50-12.5 MG tablet Commonly known as: HYZAAR TAKE 1 TABLET EVERY DAY   methocarbamol 500 MG tablet Commonly known as: ROBAXIN Take 1 tablet (500 mg total) by mouth every 6 (six) hours as needed for muscle spasms.   montelukast 10 MG tablet Commonly known as: SINGULAIR Take 1 tablet (10 mg total) by mouth at bedtime.   pravastatin 20 MG tablet Commonly known as: PRAVACHOL Take 1 tablet (20 mg total) by mouth at bedtime.   senna-docusate 8.6-50 MG tablet Commonly known as: Senokot-S Take 2 tablets by mouth at bedtime.   thiamine 100 MG tablet Commonly known as: VITAMIN B1 Take 1 tablet (100 mg total) by mouth daily. Replaces: thiamine 100 MG tablet   traMADol 50 MG tablet Commonly known as: ULTRAM Take 1 tablet (50 mg total) by mouth every 6 (six) hours as needed for severe pain (pain score 7-10).        Follow-up Information     Raulkar, Drema Pry, MD Follow up.   Specialty: Physical Medicine and Rehabilitation Why: No formal follow-up needed Contact information: [address] [place] [address] [phone]         Roby Lofts, MD Follow up.   Specialty: Orthopedic Surgery Why: Call for appointment Contact information: [address] [place] [address] [phone]                 Signed: Mcarthur Rossetti Tiara Maultsby 05/12/2023, 5:09 AM

## 2023-05-04 DIAGNOSIS — T07XXXA Unspecified multiple injuries, initial encounter: Secondary | ICD-10-CM | POA: Diagnosis not present

## 2023-05-04 LAB — COMPREHENSIVE METABOLIC PANEL
ALT: 42 U/L (ref 0–44)
AST: 32 U/L (ref 15–41)
Albumin: 3 g/dL — ABNORMAL LOW (ref 3.5–5.0)
Alkaline Phosphatase: 84 U/L (ref 38–126)
Anion gap: 8 (ref 5–15)
BUN: 23 mg/dL (ref 8–23)
CO2: 26 mmol/L (ref 22–32)
Calcium: 9.5 mg/dL (ref 8.9–10.3)
Chloride: 102 mmol/L (ref 98–111)
Creatinine, Ser: 0.87 mg/dL (ref 0.44–1.00)
GFR, Estimated: >60 mL/min (ref >60)
Glucose, Bld: 117 mg/dL — ABNORMAL HIGH (ref 70–99)
Potassium: 4 mmol/L (ref 3.5–5.1)
Sodium: 136 mmol/L (ref 135–145)
Total Bilirubin: 0.5 mg/dL (ref <1.2)
Total Protein: 6.5 g/dL (ref 6.5–8.1)

## 2023-05-04 LAB — CBC WITH DIFFERENTIAL/PLATELET
Abs Immature Granulocytes: 0.16 10*3/uL — ABNORMAL HIGH (ref 0.00–0.07)
Basophils Absolute: 0.1 10*3/uL (ref 0.0–0.1)
Basophils Relative: 1 %
Eosinophils Absolute: 0.4 10*3/uL (ref 0.0–0.5)
Eosinophils Relative: 5 %
HCT: 31.4 % — ABNORMAL LOW (ref 36.0–46.0)
Hemoglobin: 10.2 g/dL — ABNORMAL LOW (ref 12.0–15.0)
Immature Granulocytes: 2 %
Lymphocytes Relative: 26 %
Lymphs Abs: 2.1 10*3/uL (ref 0.7–4.0)
MCH: 29.9 pg (ref 26.0–34.0)
MCHC: 32.5 g/dL (ref 30.0–36.0)
MCV: 92.1 fL (ref 80.0–100.0)
Monocytes Absolute: 0.9 10*3/uL (ref 0.1–1.0)
Monocytes Relative: 11 %
Neutro Abs: 4.4 10*3/uL (ref 1.7–7.7)
Neutrophils Relative %: 55 %
Platelets: 461 10*3/uL — ABNORMAL HIGH (ref 150–400)
RBC: 3.41 MIL/uL — ABNORMAL LOW (ref 3.87–5.11)
RDW: 15.5 % (ref 11.5–15.5)
WBC: 8 10*3/uL (ref 4.0–10.5)
nRBC: 0 % (ref 0.0–0.2)

## 2023-05-04 LAB — MAGNESIUM: Magnesium: 1.8 mg/dL (ref 1.7–2.4)

## 2023-05-04 MED ORDER — MAGNESIUM GLUCONATE 500 MG PO TABS
250.0000 mg | ORAL_TABLET | Freq: Every day | ORAL | Status: DC
  Administered 2023-05-04 – 2023-05-06 (×3): 250 mg via ORAL
  Filled 2023-05-04 (×3): qty 1

## 2023-05-04 MED ORDER — DIPHENHYDRAMINE HCL 25 MG PO CAPS: 25.0000 mg | ORAL_CAPSULE | Freq: Four times a day (QID) | ORAL | Status: DC | PRN

## 2023-05-04 MED ORDER — LORAZEPAM 0.5 MG PO TABS
1.0000 mg | ORAL_TABLET | Freq: Two times a day (BID) | ORAL | Status: DC
  Administered 2023-05-04 – 2023-05-12 (×17): 1 mg via ORAL
  Filled 2023-05-04 (×17): qty 2

## 2023-05-04 MED ORDER — B COMPLEX-C PO TABS
1.0000 | ORAL_TABLET | Freq: Every day | ORAL | Status: DC
  Administered 2023-05-04 – 2023-05-12 (×9): 1 via ORAL
  Filled 2023-05-04 (×9): qty 1

## 2023-05-04 MED ORDER — TRAMADOL HCL 50 MG PO TABS
50.0000 mg | ORAL_TABLET | Freq: Four times a day (QID) | ORAL | Status: DC | PRN
  Administered 2023-05-04 – 2023-05-10 (×10): 50 mg via ORAL
  Filled 2023-05-04 (×10): qty 1

## 2023-05-04 MED ORDER — IBUPROFEN 400 MG PO TABS
400.0000 mg | ORAL_TABLET | Freq: Three times a day (TID) | ORAL | Status: DC
  Administered 2023-05-04 – 2023-05-12 (×22): 400 mg via ORAL
  Filled 2023-05-04 (×25): qty 1

## 2023-05-04 MED ORDER — L-METHYLFOLATE-B6-B12 3-35-2 MG PO TABS
1.0000 | ORAL_TABLET | Freq: Every day | ORAL | Status: DC
  Administered 2023-05-04 – 2023-05-12 (×9): 1 via ORAL
  Filled 2023-05-04 (×9): qty 1

## 2023-05-04 NOTE — Progress Notes (Signed)
Inpatient Rehabilitation  Patient information reviewed and entered into eRehab system by Demarrio Menges Gentry, OTR/L, Rehab Quality Coordinator.   Information including medical coding, functional ability and quality indicators will be reviewed and updated through discharge.   

## 2023-05-04 NOTE — Progress Notes (Signed)
Inpatient Rehabilitation Center Individual Statement of Services  Patient Name:  Morgan Potter  Date:  05/04/2023  Welcome to the Inpatient Rehabilitation Center.  Our goal is to provide you with an individualized program based on your diagnosis and situation, designed to meet your specific needs.  With this comprehensive rehabilitation program, you will be expected to participate in at least 3 hours of rehabilitation therapies Monday-Friday, with modified therapy programming on the weekends.  Your rehabilitation program will include the following services:  Physical Therapy (PT), Occupational Therapy (OT), 24 hour per day rehabilitation nursing, Therapeutic Recreaction (TR), Neuropsychology, Care Coordinator, Rehabilitation Medicine, Nutrition Services, and Pharmacy Services  Weekly team conferences will be held on Wednesday to discuss your progress.  Your Inpatient Rehabilitation Care Coordinator will talk with you frequently to get your input and to update you on team discussions.  Team conferences with you and your family in attendance may also be held.  Expected length of stay: 12-14 days  Overall anticipated outcome: CGA level  Depending on your progress and recovery, your program may change. Your Inpatient Rehabilitation Care Coordinator will coordinate services and will keep you informed of any changes. Your Inpatient Rehabilitation Care Coordinator's name and contact numbers are listed  below.  The following services may also be recommended but are not provided by the Inpatient Rehabilitation Center:  Driving Evaluations Home Health Rehabiltiation Services Outpatient Rehabilitation Services    Arrangements will be made to provide these services after discharge if needed.  Arrangements include referral to agencies that provide these services.  Your insurance has been verified to be:  [employer] Your primary doctor is:  Fran Lowes  Pertinent information will be shared  with your doctor and your insurance company.  Inpatient Rehabilitation Care Coordinator:  Dossie Der, [place] [phone] or Luna Glasgow  Information discussed with and copy given to patient by: Lucy Chris, 05/04/2023, 10:01 AM

## 2023-05-04 NOTE — Progress Notes (Signed)
Physical Therapy Session Note  Patient Details  Name: Morgan Potter MRN: [id number] Date of Birth: [birth year]  Today's Date: 05/04/2023 PT Individual Time: 0930-1027 + 1502-1530 PT Individual Time Calculation (min): 57 min  + 28 min  Short Term Goals: Week 1:  PT Short Term Goal 1 (Week 1): Pt will increase bed mobility to min A PT Short Term Goal 2 (Week 1): Pt will increase transfers to min A. PT Short Term Goal 3 (Week 1): Pt will ambulate without assistive device about 100 feet with min A PT Short Term Goal 4 (Week 1): Pt will ascend/descend 1 to 2 steps with no rails and min A PT Short Term Goal 5 (Week 1): Pt will propel w/c about 50 feet with B LEs and S  Skilled Therapeutic Interventions/Progress Updates:      1st session: Pt sitting in wheelchair to start. Reports generalized pain, received pain Rx recently and not due for any.   Pt asking if her Ativan and Ibuprofen can be changed from PRN to scheduled - medical team messaged via secure chat.   Pt highly anxious, tangential, and hyper verbose during her treatment. Gently redirected as needed but this behavior is likely her personality and baseline. Would benefit from neuropsych assessment.  Transported to main rehab gym at wheel chair level. Sit<>stand with min/modA from w/c while instructed to avoid using BUE due to NWB status. Pt reports baseline difficulty of standing without pushing from arm's from surfaces. Also reports she was going to OPPT at [facility] for knee pain and hip weakness treatment.   Pt ambulated 367ft + 217ft with CGA/minA and no AD in rehab spaces, gait slightly antalgic on L but no knee buckling or LOB observed. Bledsoe brace noted to begin to slide down her leg while ambulating.   Pt assisted into supine position on mat table to reposition and tighten Bledsoe.  Mat table there-ex: -1x30 ankle pumps -1x12 SLR bilaterally -1x12 hip abd/add bilaterally -1x15 glut sets -1x15 quad sets -1x15 SAQ with  bolster *cues for sequencing, dosage, and technique for all there-ex.   Pt ended treatment in her room with seat belt alarm on, LUE supported with pillow, and call bell within reach. LPN present for medications.     2nd session: Pt lying in bed to start, has no c/o pain, just fatigued from busy day of therapies. She requests to go to the bathroom before leaving her room. Supine<>sitting EOB without assist using hospital bed features, cues for NWB BUE precautions.   Sit<>Stand from EOB with minA for powering to rise. Ambulates with minA and no AD into the bathroom and needs minimal assist for lowering pants in standing. Poor controlled lowering to sit. Pt continent of bladder void. Ambulated with minA and no AD to the sink for her to complete hand hygiene - CGA for static standing balance.   Ambulated ~144ft with no AD and CGA/minA from her room to day room rehab gym. Cautious gait pattern, decreased gait speed. Bledsoe brace slidding down her L leg. Readjusting and repositioning once in the gym.   Completed seated there-ex with 2# ankle weight on LLE - LAQ and hip marches 3x15, as well as seated heel raises/ankle pumps 2x25.   Ambulated back to her room with similar assist and cues. Assisted to bed, able to lift LLE into bed without assist. All needs met.       Therapy Documentation Precautions:  Precautions Precautions: Shoulder, Fall Type of Shoulder Precautions: L UE avoid  passive and active shoulder ROM x 2 weeks, OK unrestricted elbow/wrist ROM Shoulder Interventions: Shoulder sling/immobilizer Precaution Booklet Issued: No Precaution Comments: R UE unrestricted ROM, L LE OK for for 0 to 60 degrees knee flexion in hinged brace Required Braces or Orthoses: Sling, Other Brace Other Brace: L LE hinged knee brace Restrictions Weight Bearing Restrictions Per Provider Order: Yes RUE Weight Bearing Per Provider Order: Non weight bearing LUE Weight Bearing Per Provider Order: Non  weight bearing LLE Weight Bearing Per Provider Order: Weight bearing as tolerated Other Position/Activity Restrictions: L UE sling and no shoulder ROM, R UE ROM unrestricted; playmakers brace 0-60 L knee General:     Therapy/Group: Individual Therapy  Malaki Koury P Meiling Hendriks  PT, DPT, CSRS  05/04/2023, 7:44 AM

## 2023-05-04 NOTE — Progress Notes (Signed)
Inpatient Rehabilitation Care Coordinator Assessment and Plan Patient Details  Name: Morgan Potter MRN: [id number] Date of Birth: [birth year]  Today's Date: 05/04/2023  Hospital Problems: Principal Problem:   Critical polytrauma  Past Medical History:  Past Medical History:  Diagnosis Date   Dizziness and giddiness    Dyspnea    Fatigue    Lightheadedness    Palpitations    SOB (shortness of breath)    Weakness    Past Surgical History:  Past Surgical History:  Procedure Laterality Date   APPENDECTOMY  1976   CESAREAN SECTION  1981   CESAREAN SECTION  1969   cholecystectomy  1976   ORIF HUMERUS FRACTURE Right 04/27/2023   Procedure: OPEN REDUCTION INTERNAL FIXATION (ORIF) HUMERAL SHAFT FRACTURE;  Surgeon: Roby Lofts, MD;  Location: [facility] OR;  Service: Orthopedics;  Laterality: Right;   Social History:  reports that she has never smoked. She has never used smokeless tobacco. No history on file for alcohol use and drug use.  Family / Support Systems Marital Status: Widow/Widower How Long?: 05/2022 Patient Roles: Parent, Caregiver Children: Morgan Potter-daughter [phone] Other Supports: Friends Anticipated Caregiver: Morgan Potter depends upon daughter's schedule Ability/Limitations of Caregiver: Daughter works Medical laboratory scientific officer: Other (Comment) (needs to come up with a definte plan) Family Dynamics: Close with daughter who moved in with pt in 05/2022 when he passed. Morgan Potter has custody of her four grandchildren-4 yo-16 yo  Social History Preferred language: English Religion: Jehovah's Witness Cultural Background: Air traffic controller Witness Education: HS Health Literacy - How often do you need to have someone help you when you read instructions, pamphlets, or other written material from your doctor or pharmacy?: Never Writes: Yes Employment Status: Retired Marine scientist Issues: No issues Guardian/Conservator: None-according to MD pt is capable of making her own decisions while  here   Abuse/Neglect Abuse/Neglect Assessment Can Be Completed: Yes Physical Abuse: Denies Verbal Abuse: Denies Sexual Abuse: Denies Exploitation of patient/patient's resources: Denies Self-Neglect: Denies  Patient response to: Social Isolation - How often do you feel lonely or isolated from those around you?: Never  Emotional Status Pt's affect, behavior and adjustment status: Pt is motivated to do well but feels limited due to WB issues. She was independent and assisted with her great grandchildren along with daughter. Recent Psychosocial Issues: other health issues-coping from husband's death in jan Psychiatric History: History of depression/anxiety takes medications and has a psychiatrist she see's since husband's death Substance Abuse History: No issues  Patient / Family Perceptions, Expectations & Goals Pt/Family understanding of illness & functional limitations: Pt is having difficulty with her WB issues and injuries, she does feel she can not do much, due to this. She does talk with the MD's involved and will do what she can with her injuries Premorbid pt/family roles/activities: mom, grandmother, great grandmother, retiree, etc Anticipated changes in roles/activities/participation: resume Pt/family expectations/goals: Pt states: " I want to do for myself but I am so limited with my injuries."  [employer]: None Premorbid Home Care/DME Agencies: None Transportation available at discharge: daughter Is the patient able to respond to transportation needs?: Yes In the past 12 months, has lack of transportation kept you from medical appointments or from getting medications?: No In the past 12 months, has lack of transportation kept you from meetings, work, or from getting things needed for daily living?: No Resource referrals recommended: Neuropsychology  Discharge Planning Living Arrangements: Children, Other relatives Support Systems: Children,  Other relatives, Friends/neighbors Type of Residence: Private residence [employer]  Resources: Media planner (specify) Passenger transport manager) Financial Resources: Social Security Financial Screen Referred: No Living Expenses: Own Money Management: Patient Does the patient have any problems obtaining your medications?: No Home Management: self and daughter Patient/Family Preliminary Plans: Return home with daughter hopefully be available to assist her and all of the grandchildren. Pt will need assist and is telling her daughter she needs her to be there. Will continue to work on discharge needs Care Coordinator Barriers to Discharge: Insurance for SNF coverage, Decreased caregiver support, Weight bearing restrictions Care Coordinator Anticipated Follow Up Needs: HH/OP  Clinical Impression Pleasant upset pt who really wants to do well but feels very limited due to her weight bearing issues and injuries. She is talking with her daughter regarding her schedule and will need to be there and care for her grandchildren even though pt assisted with them when able. Have placed on neuro-psych list to be seen this week  Modine Oppenheimer, Lemar Livings 05/04/2023, 10:13 AM

## 2023-05-04 NOTE — Progress Notes (Signed)
PROGRESS NOTE   Subjective/Complaints: +anxiety, was on ativan scheduled at home, schedule here as per her request Requests ibuprofen be scheduled and tramadol added for pain  ROS: +pain    Objective:   VAS [place] LOWER EXTREMITY VENOUS (DVT) Result Date: 05/04/2023  Lower Venous DVT Study Patient Name:  LYNDON FISS Stukes  Date of Exam:   05/03/2023 Medical Rec #: [id number]       Accession #:    [id number] Date of Birth: [birth year]        Patient Gender: F Patient Age:   72 years Exam Location:  [facility] Procedure:      VAS [place] LOWER EXTREMITY VENOUS (DVT) Referring Phys: Mariam Dollar --------------------------------------------------------------------------------  Indications: Status post fall resulting in acute fracture of the right humerus, fracture of the left humeral head, and nondisplaced transverse fracture of the left patella.  Risk Factors: Immobility. Limitations: Orthopaedic appliance. Comparison Study: No prior study on file Performing Technologist: Sherren Kerns RVS  Examination Guidelines: A complete evaluation includes B-mode imaging, spectral Doppler, color Doppler, and power Doppler as needed of all accessible portions of each vessel. Bilateral testing is considered an integral part of a complete examination. Limited examinations for reoccurring indications may be performed as noted. The reflux portion of the exam is performed with the patient in reverse Trendelenburg.  +---------+---------------+---------+-----------+----------+--------------+ RIGHT    CompressibilityPhasicitySpontaneityPropertiesThrombus Aging +---------+---------------+---------+-----------+----------+--------------+ CFV      Full           Yes      Yes                                 +---------+---------------+---------+-----------+----------+--------------+ SFJ      Full                                                         +---------+---------------+---------+-----------+----------+--------------+ FV Prox  Full                                                        +---------+---------------+---------+-----------+----------+--------------+ FV Mid   Full                                                        +---------+---------------+---------+-----------+----------+--------------+ FV Distal               Yes      Yes                                 +---------+---------------+---------+-----------+----------+--------------+ PFV      Full                                                        +---------+---------------+---------+-----------+----------+--------------+  POP      Full           Yes      Yes                                 +---------+---------------+---------+-----------+----------+--------------+ PTV      Full                                                        +---------+---------------+---------+-----------+----------+--------------+ PERO     Full                                                        +---------+---------------+---------+-----------+----------+--------------+   +---------+---------------+---------+-----------+----------+--------------+ LEFT     CompressibilityPhasicitySpontaneityPropertiesThrombus Aging +---------+---------------+---------+-----------+----------+--------------+ CFV      Full           Yes      Yes                                 +---------+---------------+---------+-----------+----------+--------------+ SFJ      Full                                                        +---------+---------------+---------+-----------+----------+--------------+ FV Prox  Full                                                        +---------+---------------+---------+-----------+----------+--------------+ FV Mid   Full                                                         +---------+---------------+---------+-----------+----------+--------------+ FV DistalFull                                                        +---------+---------------+---------+-----------+----------+--------------+ PFV      Full                                                        +---------+---------------+---------+-----------+----------+--------------+ POP      Full           Yes      Yes                                 +---------+---------------+---------+-----------+----------+--------------+  PTV      Full                                                        +---------+---------------+---------+-----------+----------+--------------+ PERO     Full                                                        +---------+---------------+---------+-----------+----------+--------------+ Gastroc  Full                                                        +---------+---------------+---------+-----------+----------+--------------+     Summary: RIGHT: - There is no evidence of deep vein thrombosis in the lower extremity.  - No cystic structure found in the popliteal fossa.  LEFT: - There is no evidence of deep vein thrombosis in the lower extremity. However, portions of this examination were limited- see technologist comments above.  - No cystic structure found in the popliteal fossa.  *See table(s) above for measurements and observations. Electronically signed by Heath Lark on 05/04/2023 at 7:34:34 AM.    Final    Recent Labs    05/04/23 0514  WBC 8.0  HGB 10.2*  HCT 31.4*  PLT 461*   Recent Labs    05/04/23 0514  NA 136  K 4.0  CL 102  CO2 26  GLUCOSE 117*  BUN 23  CREATININE 0.87  CALCIUM 9.5    Intake/Output Summary (Last 24 hours) at 05/04/2023 1047 Last data filed at 05/04/2023 0729 Gross per 24 hour  Intake 720 ml  Output 1225 ml  Net -505 ml        Physical Exam: Vital Signs Blood pressure (!) 148/83, pulse 77, temperature 98 F  (36.7 C), resp. rate 18, height 5\' 9"  (1.753 m), weight 100.1 kg, SpO2 95%. Gen: no distress, normal appearing HEENT: oral mucosa pink and moist, NCAT Cardio: Reg rate Chest: normal effort, normal rate of breathing Abd: soft, non-distended Ext: no edema Psych: pleasant, normal affect Skin:    General: Skin is warm and dry.     Comments: Extensive purple/yellow bruising on B/L Ue's Dressing R bicep- down entire bicep- looks C/D/I KI on LLE-   Neurological:     Mental Status: She is alert.     Comments: Patient is alert no acute distress oriented x 3 and follows commands. Intact to light touch in all 4 extremities  Psychiatric:        Mood and Affect: Mood normal.        Behavior: Behavior normal.     Comments: anxious   PRIOR EXAMS: Musculoskeletal:     Cervical back: Neck supple. No tenderness.     Comments: Cannot test deltoid or biceps B/L- due to humeral fx's or triceps WE/Grip and FA 5-/5 B/L  LE's- HF 4+/5; KE on R 4+/5; NT on L due to KI; DF/PF 5/5 B/L Wearing sling on LUE- because not allowed to move L shoulder at all- and KI on LLE  Assessment/Plan: 1. Functional deficits which require 3+ hours per day of interdisciplinary therapy in a comprehensive inpatient rehab setting. Physiatrist is providing close team supervision and 24 hour management of active medical problems listed below. Physiatrist and rehab team continue to assess barriers to discharge/monitor patient progress toward functional and medical goals  Care Tool:  Bathing    Body parts bathed by patient: Left arm, Chest, Abdomen, Face   Body parts bathed by helper: Right arm, Front perineal area, Buttocks, Right upper leg, Left upper leg, Left lower leg, Right lower leg     Bathing assist Assist Level: Maximal Assistance - Patient 24 - 49%     Upper Body Dressing/Undressing Upper body dressing   What is the patient wearing?: Hospital gown only    Upper body assist Assist Level: Moderate  Assistance - Patient 50 - 74%    Lower Body Dressing/Undressing Lower body dressing      What is the patient wearing?: Hospital gown only     Lower body assist Assist for lower body dressing: Maximal Assistance - Patient 25 - 49% (MaxA to Dep)     Toileting Toileting    Toileting assist Assist for toileting: Dependent - Patient 0%     Transfers Chair/bed transfer  Transfers assist     Chair/bed transfer assist level: Moderate Assistance - Patient 50 - 74%     Locomotion Ambulation   Ambulation assist      Assist level: Moderate Assistance - Patient 50 - 74% Assistive device: No Device Max distance: 50   Walk 10 feet activity   Assist     Assist level: Moderate Assistance - Patient - 50 - 74% Assistive device: No Device   Walk 50 feet activity   Assist    Assist level: Moderate Assistance - Patient - 50 - 74% Assistive device: No Device    Walk 150 feet activity   Assist Walk 150 feet activity did not occur: Safety/medical concerns         Walk 10 feet on uneven surface  activity   Assist Walk 10 feet on uneven surfaces activity did not occur: Safety/medical concerns         Wheelchair     Assist Is the patient using a wheelchair?: Yes Type of Wheelchair: Manual    Wheelchair assist level: Dependent - Patient 0% Max wheelchair distance: 150    Wheelchair 50 feet with 2 turns activity    Assist        Assist Level: Dependent - Patient 0%   Wheelchair 150 feet activity     Assist      Assist Level: Dependent - Patient 0%   Blood pressure (!) 148/83, pulse 77, temperature 98 F (36.7 C), resp. rate 18, height 5\' 9"  (1.753 m), weight 100.1 kg, SpO2 95%.    Medical Problem List and Plan: 1. Functional deficits secondary to polytrauma after fall 2012 12/06/2022 -patient may  shower- cover KI and her incisions and L sling- if can take shower, then able to             -ELOS/Goals: 14-16 days- min A -Continue  CIR -NWB B/L UE's- Sling in LUE with no Shoulder ROM for 2 weeks at all -RUE- ROM as tolerated -KI with WBAT on LLE Vitamin D, B, and C supplements started 2.  Antithrombotics: -DVT/anticoagulation:  Pharmaceutical: Lovenox 40mg  daily check vascular study             -antiplatelet therapy: N/A 3. Pain Management: d/c  hydrocodone given pruritus. Advil 400 mg scheduled TID  5. Neuropsych/cognition: This patient is capable of making decisions on her own behalf. 6. Skin/Wound Care: Routine skin checks. R eyebrow sutured 12/7, likely ready to come out 12/17 or sooner 7. Fluids/Electrolytes/Nutrition: Routine in and outs with follow-up chemistries, continue vitamins 8.  Fracture involving the distal diaphysis of the right humerus.  Status post ORIF of humeral shaft fracture 04/27/2023.  Nonweightbearing, ROM as tolerated 9.  Comminuted mildly displaced fracture of the left humeral head.  Nonoperative.  Nonweightbearing.  Shoulder sling at all times, no shoulder ROM for 2wks. 10.  Nondisplaced transverse fracture left patella.  Weightbearing as tolerated..  Knee immobilizer at all times 11.  Acute blood loss anemia.  Follow-up CBC.  Continue iron supplement 12.  Hypertension.  HCTZ 12.5 mg daily, Cozaar 50 mg daily.  Monitor with increased mobility             -05/03/23 BPs a little up but monitor trend before adjustments made      Vitals:    05/02/23 1510 05/02/23 2036 05/03/23 0325  BP: (!) 140/69 125/72 (!) 154/83    13.  Constipation.  Colace 100 mg twice daily, MiraLAX daily.  Senokot S 2 tabs nightly. Dulcolax suppository as needed             -05/03/23 LBM last night and this AM, monitor 14.  Hyperlipidemia.  Pravachol 20mg  daily 15.  GERD.  Protonix 40mg  daily. Add magnesium supplement HS as can be depleted by Protonix.   16. Pruritus: benadryl prn ordered  17. Anxiety: continue wellbutrin and scheduled Ativan  LOS: 2 days A FACE TO FACE EVALUATION WAS PERFORMED  Drema Pry  Nickholas Goldston 05/04/2023, 10:47 AM

## 2023-05-04 NOTE — Progress Notes (Signed)
Occupational Therapy Session Note  Patient Details  Name: Morgan Potter MRN: [id number] Date of Birth: 1950/05/28  Today's Date: 05/04/2023 OT Individual Time: 1105-1210 OT Individual Time Calculation (min): 65 min    Short Term Goals: Week 1:  OT Short Term Goal 1 (Week 1): The pt will complete UB BADL task  in bathing/dress with  CGA/MinA at 95% safe using modified technique OT Short Term Goal 2 (Week 1): The pt will complete LB BADL in bathing/dressing with Mod/ MinA at 95% safe using AE at 95% safe OT Short Term Goal 3 (Week 1): The pt will tolerate 45 minutes of activity @95 % safe with intermittent rest breaks. OT Short Term Goal 4 (Week 1): The pt will improve core strength to 3+/5MMT at 95% safe for gains in functional performance.  Skilled Therapeutic Interventions/Progress Updates:    Patient received seated in wheelchair.  Patient apprehensive regarding taking a shower.  Patient needing max encouragement to engage in this activity and to attempt to use her RUE.  Patient with some self limiting behaviors, also tearful at beginning of session.  As session progressed, patient indicates she has been isolating herself from others since her husband died in 06-24-2022.  She reports taking antidepressants for years, and that they "dull her senses."  Patient became more animated as session progressed.  Patient needing max assist to achieve standing from wheelchair or shower seat, but once up needs min assist with walking.  Patient able to use RUE to wash face, abdomen, upper legs.  Needing assist for thoroughness of pericare.  Patient attempting to help with all aspects of bathing and dressing, would benefit from AE instruction. Patient uncertain of her movement restrictions in RUE - clarified order - unrestricted movement in RUE/ NWB. Patient left up in wheelchair at end of session with safety belt in place and engaged and call bell in lap, personal items in reach.     Therapy  Documentation Precautions:  Precautions Precautions: Shoulder, Fall Type of Shoulder Precautions: L UE avoid passive and active shoulder ROM x 2 weeks, OK unrestricted elbow/wrist ROM Shoulder Interventions: Shoulder sling/immobilizer Precaution Booklet Issued: No Precaution Comments: R UE unrestricted ROM, L LE OK for for 0 to 60 degrees knee flexion in hinged brace Required Braces or Orthoses: Sling, Other Brace Other Brace: L LE hinged knee brace Restrictions Weight Bearing Restrictions Per Provider Order: Yes RUE Weight Bearing Per Provider Order: Non weight bearing LUE Weight Bearing Per Provider Order: Non weight bearing LLE Weight Bearing Per Provider Order: Weight bearing as tolerated Other Position/Activity Restrictions: L UE sling and no shoulder ROM, R UE ROM unrestricted; playmakers brace 0-60 L knee  Pain:  Reports pain 3/10 LUE/LLE    Therapy/Group: Individual Therapy  Collier Salina 05/04/2023, 12:28 PM

## 2023-05-05 DIAGNOSIS — F418 Other specified anxiety disorders: Secondary | ICD-10-CM

## 2023-05-05 DIAGNOSIS — I10 Essential (primary) hypertension: Secondary | ICD-10-CM | POA: Diagnosis not present

## 2023-05-05 DIAGNOSIS — G8918 Other acute postprocedural pain: Secondary | ICD-10-CM

## 2023-05-05 DIAGNOSIS — T07XXXA Unspecified multiple injuries, initial encounter: Secondary | ICD-10-CM | POA: Diagnosis not present

## 2023-05-05 NOTE — IPOC Note (Addendum)
Overall Plan of Care [facility]) Patient Details Name: Morgan Potter MRN: [id number] DOB: 1951/05/03  Admitting Diagnosis: Critical polytrauma  Hospital Problems: Principal Problem:   Critical polytrauma     Functional Problem List: Nursing Endurance, Medication Management, Pain, Safety, Skin Integrity  PT Balance, Endurance, Safety  OT Balance, Endurance, Pain, Motor  SLP    TR         Basic ADL's: OT Grooming, Bathing, Dressing, Toileting     Advanced  ADL's: OT       Transfers: PT Bed Mobility, Bed to Chair, Car  OT       Locomotion: PT Ambulation, Stairs, Wheelchair Mobility     Additional Impairments: OT Fuctional Use of Upper Extremity  SLP        TR      Anticipated Outcomes Item Anticipated Outcome  Self Feeding s/u  Swallowing      Basic self-care  ModI to MinA  Toileting  ModI to [employer] Transfers ModI  Bowel/Bladder  Maintain continence  Transfers  contact gaurd transfers  Locomotion  contact gaurd gait, min A stairs, mod I w/c mobility  Communication     Cognition     Pain  <3  Safety/Judgment  Min assist and no falls   Therapy Plan: PT Intensity: Minimum of 1-2 x/day ,45 to 90 minutes PT Frequency: 5 out of 7 days PT Duration Estimated Length of Stay: 12 to 14 days OT Intensity: Minimum of 1-2 x/day, 45 to 90 minutes OT Frequency: Total of 15 hours over 7 days of combined therapies, 5 out of 7 days OT Duration/Estimated Length of Stay: 12-14  weeks     Team Interventions: Nursing Interventions Patient/Family Education, Disease Management/Prevention, Pain Management, Medication Management, Skin Care/Wound Management, Discharge Planning  PT interventions Ambulation/gait training, Balance/vestibular training, Discharge planning, Functional electrical stimulation, DME/adaptive equipment instruction, Functional mobility training, Neuromuscular re-education, Patient/family education, Stair training, Splinting/orthotics,  Therapeutic Activities, UE/LE Strength taining/ROM, UE/LE Coordination activities, Wheelchair propulsion/positioning, Therapeutic Exercise  OT Interventions DME/adaptive equipment instruction, Therapeutic Activities, UE/LE Strength taining/ROM, Therapeutic Exercise, Self Care/advanced ADL retraining, Balance/vestibular training, UE/LE Coordination activities  SLP Interventions    TR Interventions    SW/CM Interventions Discharge Planning, Psychosocial Support, Patient/Family Education   Barriers to Discharge MD  Medical stability  Nursing Decreased caregiver support, Lack of/limited family support, Weight bearing restrictions Lives alone in mobile home, 2 steps to enter.  PT Inaccessible home environment, Decreased caregiver support 2 steps with no rails to enter and 1 step no rails in house to get to main level, pt also doesn't 24/7 S/assistance  OT      SLP      SW Insurance for SNF coverage, Decreased caregiver support, Weight bearing restrictions     Team Discharge Planning: Destination: PT-Home ,OT-   Home, SLP-  Projected Follow-up: PT-Home health PT, Outpatient PT, OT- home health  , SLP-  Projected Equipment Needs: PT-To be determined, OT- 3 in 1 bedside comode, SLP-  Equipment Details: PT- , OT-  Patient/family involved in discharge planning: PT- Patient,  OT-Patient, SLP-   MD ELOS: 14-16 days Medical Rehab Prognosis:  Excellent Assessment: The patient has been admitted for CIR therapies with the diagnosis of polytrauma after fall. The team will be addressing functional mobility, strength, stamina, balance, safety, adaptive techniques and equipment, self-care, bowel and bladder mgt, patient and caregiver education.  Goals have been set at [facility]. Anticipated discharge destination is home.  See Team Conference Notes for weekly updates to the plan of care

## 2023-05-05 NOTE — Consult Note (Signed)
Neuropsychological Consultation Comprehensive Inpatient Rehab   Patient:   Morgan Potter   DOB:   09/12/1950  MR Number:  [id number]  Location:  [facility] [address] [place] [address] Dept: [phone] Loc: [phone]           Date of Service:   05/05/2023  Start Time:   1 PM End Time:   2 PM  Provider/Observer:  Arley Phenix, Psy.D.       Clinical Neuropsychologist       Billing Code/Service: [phone]  Reason for Service:    Morgan Potter is a 72 year old female referred for neuropsychological consultation during her ongoing admission to the comprehensive inpatient rehabilitation unit.  Patient has a past history that includes significant anxiety and depressive symptoms with significant psychosocial stressors including her husband dying this past January.  The patient had a significant fall with multi fractures recently.  Patient's other past medical history includes hypertension, GERD, hyperlipidemia along with her anxiety and depressive symptoms.  Patient had presented on 04/25/2023 after a fall when she was rushing out of gate with coffee in her hand and tripping over an obstacle falling on her face.  Patient has denied loss of consciousness with this fall.  The patient was unable to get up and remained on the ground for approximately 15 minutes prior to rescue.  Examination revealed acute fracture involving the right humerus, left humeral head and nondisplaced transverse fractures left patella.  Patient had emergent orthopedic interventions and is nonweightbearing to right upper extremities with shoulder sling immobilizer.  Patient is weightbearing as tolerated to left lower extremity.  Patient was admitted onto the comprehensive rehabilitation unit due to decreased functional mobility.  During today's clinical visit, the patient reports that she has been followed by Dr. Evelene Croon for psychiatric care for  nearly 30 years.  Patient continues with her home medicines of Wellbutrin and Lexapro.  Patient has also been taking Ativan/lorazepam for nearly 30 years as well.  Efforts have been made to limit how much Ativan the patient has been taking to avoid risk for fall and safety concerns during her rehab.  Patient reports that this has been stressful but today we spent time reviewing the needs for monitoring some of these medications during her acute rehab phase.  The patient states understood these issues.  Patient reports that she has experienced an increase in her anxiety symptoms but she is managing them overall.  Patient describes a lot of anxiety when doing weightbearing task for lower extremities even though she has been cleared for weightbearing lower extremity.  Patient reports that the limits she has for her upper extremities are quite a challenge for her.  Patient was oriented with good cognition throughout and following well with her our phone conversations throughout.  Patient appears to be motivated to continue with therapeutic efforts.  HPI for the current admission:    HPI: Morgan Potter is a 72 year old left-handed female with past medical history of hypertension, anxiety, GERD, hyperlipidemia. Per chart review patient lives with her family including 4 great-grandchildren from 43-year-old to a 28 year old. Mobile home with 2 steps to entry. Independent prior to admission. She is Jehovah witness. Presented 04/25/2023 after a fall when she was rushing out of her gait with a coffee in her hand when she tripped over a pot and fell on her face. She denied loss of consciousness but was unable to get up and remained on  the ground for approximately 15 minutes. Admission chemistries unremarkable except WBC 12,500, CK1 28. CT of the brain as well as cervical spine negative. X-rays and imaging revealed acute fracture involving the distal diaphysis of the right humerus, comminuted mildly displaced acute fracture  of the left humeral head and nondisplaced transverse fracture left patella. Underwent open reduction trial fixation of right humeral shaft fracture 04/27/2023 per Dr. Jena Gauss.. Nonoperative left upper extremity placed in a sling as well as left lower extremity knee brace. Nonweightbearing to right upper extremity with shoulder sling immobilizer at all times off for dressing bathing exercises as well as nonweightbearing to left upper extremity. Weightbearing as tolerated to left lower extremity. Acute blood loss anemia 9.3 and monitored. Mild hyponatremia 132 and monitored. Placed on Lovenox for DVT prophylaxis. Therapy evaluations completed due to patient decreased functional mobility was admitted for a comprehensive rehab program.   Medical History:   Past Medical History:  Diagnosis Date   Dizziness and giddiness    Dyspnea    Fatigue    Lightheadedness    Palpitations    SOB (shortness of breath)    Weakness          Patient Active Problem List   Diagnosis Date Noted   Critical polytrauma 05/02/2023   Fall 04/25/2023   Normocytic anemia 04/25/2023   Refusal of blood product 04/25/2023   Fracture of humeral head, closed, left, initial encounter 04/25/2023   Eyebrow laceration, right, initial encounter 04/25/2023   Left patella fracture 04/25/2023   Closed displaced comminuted fracture of shaft of right humerus 04/25/2023   Leukocytosis 04/25/2023   Essential hypertension 04/25/2023   Anxiety and depression 04/25/2023   Hyperlipidemia 04/25/2023    Behavioral Observation/Mental Status:   Morgan Potter  presents as a 72 y.o.-year-old Right handed Caucasian Female who appeared her stated age. her dress was Appropriate and she was Well Groomed and her manners were Appropriate to the situation.  her participation was indicative of Appropriate and Attentive behaviors.  There were physical disabilities noted.  she displayed an appropriate level of cooperation and motivation.     Interactions:    Active Appropriate and Attentive  Attention:   within normal limits and attention span and concentration were age appropriate  Memory:   within normal limits; recent and remote memory intact  Visuo-spatial:   not examined  Speech (Volume):  normal  Speech:   normal; normal  Thought Process:  Coherent and Relevant  Coherent and Logical  Though Content:  WNL; not suicidal and not homicidal  Orientation:   person, place, time/date, and situation  Judgment:   Good  Planning:   Fair  Affect:    Anxious  Mood:    Anxious  Insight:   Good  Intelligence:   normal  Psychiatric History:  Patient with significant past psychiatric history with more than 30 years under psychiatric care with a local psychiatrist.  Patient is continuing with home medicines related to escitalopram and bupropion and has access to Ativan twice daily/per request with total of twice daily.    Family Med/Psych History:  Family History  Problem Relation Age of Onset   Arrhythmia Mother    Heart attack Father    Cancer Father     Impression/DX:   Morgan Potter is a 72 year old female referred for neuropsychological consultation during her ongoing admission to the comprehensive inpatient rehabilitation unit.  Patient has a past history that includes significant anxiety and depressive symptoms with significant psychosocial stressors including  her husband dying this past January.  The patient had a significant fall with multi fractures recently.  Patient's other past medical history includes hypertension, GERD, hyperlipidemia along with her anxiety and depressive symptoms.  Patient had presented on 04/25/2023 after a fall when she was rushing out of gate with coffee in her hand and tripping over an obstacle falling on her face.  Patient has denied loss of consciousness with this fall.  The patient was unable to get up and remained on the ground for approximately 15 minutes prior to rescue.   Examination revealed acute fracture involving the right humerus, left humeral head and nondisplaced transverse fractures left patella.  Patient had emergent orthopedic interventions and is nonweightbearing to right upper extremities with shoulder sling immobilizer.  Patient is weightbearing as tolerated to left lower extremity.  Patient was admitted onto the comprehensive rehabilitation unit due to decreased functional mobility.  During today's clinical visit, the patient reports that she has been followed by Dr. Evelene Croon for psychiatric care for nearly 30 years.  Patient continues with her home medicines of Wellbutrin and Lexapro.  Patient has also been taking Ativan/lorazepam for nearly 30 years as well.  Efforts have been made to limit how much Ativan the patient has been taking to avoid risk for fall and safety concerns during her rehab.  Patient reports that this has been stressful but today we spent time reviewing the needs for monitoring some of these medications during her acute rehab phase.  The patient states understood these issues.  Patient reports that she has experienced an increase in her anxiety symptoms but she is managing them overall.  Patient describes a lot of anxiety when doing weightbearing task for lower extremities even though she has been cleared for weightbearing lower extremity.  Patient reports that the limits she has for her upper extremities are quite a challenge for her.  Patient was oriented with good cognition throughout and following well with her our phone conversations throughout.  Patient appears to be motivated to continue with therapeutic efforts.  Disposition/Plan:  Today we worked on Pharmacologist and strategies around her longstanding anxiety and depression and exacerbation after recent significant orthopedic injury/polytrauma.  Diagnosis:    Depression with anxiety         Electronically Signed   _______________________ Arley Phenix, Psy.D. Clinical  Neuropsychologist

## 2023-05-05 NOTE — Progress Notes (Addendum)
PROGRESS NOTE   Subjective/Complaints: Anxious about her ability to transfer and how she will move with all of her ortho restrictions and precautions. Otherwise doing well. Pain controlled. Moving bowels. Questioned why she's getting a diuretic.  ROS: Patient denies fever, rash, sore throat, blurred vision, dizziness, nausea, vomiting, diarrhea, cough, shortness of breath or chest pain,  headache, or mood change.     Objective:   VAS [place] LOWER EXTREMITY VENOUS (DVT) Result Date: 05/04/2023  Lower Venous DVT Study Patient Name:  Morgan Potter  Date of Exam:   05/03/2023 Medical Rec #: [id number]       Accession #:    [id number] Date of Birth: [birth year]        Patient Gender: F Patient Age:   72 years Exam Location:  [facility] Procedure:      VAS [place] LOWER EXTREMITY VENOUS (DVT) Referring Phys: Mariam Dollar --------------------------------------------------------------------------------  Indications: Status post fall resulting in acute fracture of the right humerus, fracture of the left humeral head, and nondisplaced transverse fracture of the left patella.  Risk Factors: Immobility. Limitations: Orthopaedic appliance. Comparison Study: No prior study on file Performing Technologist: Sherren Kerns RVS  Examination Guidelines: A complete evaluation includes B-mode imaging, spectral Doppler, color Doppler, and power Doppler as needed of all accessible portions of each vessel. Bilateral testing is considered an integral part of a complete examination. Limited examinations for reoccurring indications may be performed as noted. The reflux portion of the exam is performed with the patient in reverse Trendelenburg.  +---------+---------------+---------+-----------+----------+--------------+ RIGHT    CompressibilityPhasicitySpontaneityPropertiesThrombus Aging +---------+---------------+---------+-----------+----------+--------------+ CFV       Full           Yes      Yes                                 +---------+---------------+---------+-----------+----------+--------------+ SFJ      Full                                                        +---------+---------------+---------+-----------+----------+--------------+ FV Prox  Full                                                        +---------+---------------+---------+-----------+----------+--------------+ FV Mid   Full                                                        +---------+---------------+---------+-----------+----------+--------------+ FV Distal               Yes      Yes                                 +---------+---------------+---------+-----------+----------+--------------+  PFV      Full                                                        +---------+---------------+---------+-----------+----------+--------------+ POP      Full           Yes      Yes                                 +---------+---------------+---------+-----------+----------+--------------+ PTV      Full                                                        +---------+---------------+---------+-----------+----------+--------------+ PERO     Full                                                        +---------+---------------+---------+-----------+----------+--------------+   +---------+---------------+---------+-----------+----------+--------------+ LEFT     CompressibilityPhasicitySpontaneityPropertiesThrombus Aging +---------+---------------+---------+-----------+----------+--------------+ CFV      Full           Yes      Yes                                 +---------+---------------+---------+-----------+----------+--------------+ SFJ      Full                                                        +---------+---------------+---------+-----------+----------+--------------+ FV Prox  Full                                                         +---------+---------------+---------+-----------+----------+--------------+ FV Mid   Full                                                        +---------+---------------+---------+-----------+----------+--------------+ FV DistalFull                                                        +---------+---------------+---------+-----------+----------+--------------+ PFV      Full                                                        +---------+---------------+---------+-----------+----------+--------------+  POP      Full           Yes      Yes                                 +---------+---------------+---------+-----------+----------+--------------+ PTV      Full                                                        +---------+---------------+---------+-----------+----------+--------------+ PERO     Full                                                        +---------+---------------+---------+-----------+----------+--------------+ Gastroc  Full                                                        +---------+---------------+---------+-----------+----------+--------------+     Summary: RIGHT: - There is no evidence of deep vein thrombosis in the lower extremity.  - No cystic structure found in the popliteal fossa.  LEFT: - There is no evidence of deep vein thrombosis in the lower extremity. However, portions of this examination were limited- see technologist comments above.  - No cystic structure found in the popliteal fossa.  *See table(s) above for measurements and observations. Electronically signed by Heath Lark on 05/04/2023 at 7:34:34 AM.    Final    Recent Labs    05/04/23 0514  WBC 8.0  HGB 10.2*  HCT 31.4*  PLT 461*   Recent Labs    05/04/23 0514  NA 136  K 4.0  CL 102  CO2 26  GLUCOSE 117*  BUN 23  CREATININE 0.87  CALCIUM 9.5    Intake/Output Summary (Last 24 hours) at 05/05/2023 0847 Last data filed at  05/05/2023 0800 Gross per 24 hour  Intake 720 ml  Output 1625 ml  Net -905 ml        Physical Exam: Vital Signs Blood pressure 134/79, pulse 80, temperature 98.5 F (36.9 C), resp. rate 18, height 5\' 9"  (1.753 m), weight 100.1 kg, SpO2 93%. Constitutional: No distress . Vital signs reviewed. HEENT: NCAT, EOMI, oral membranes moist Neck: supple Cardiovascular: RRR without murmur. No JVD    Respiratory/Chest: CTA Bilaterally without wheezes or rales. Normal effort    GI/Abdomen: BS +, non-tender, non-distended Ext: no clubbing, cyanosis, or edema Psych: pleasant and cooperative  Skin:    General: Skin is warm and dry.     Comments:   bruising on B/L Ue's Dressing R bicep- down entire bicep- wound C/D/I KI on LLE-   Neurological:     Mental Status: She is alert.     Comments: Patient is alert no acute distress oriented x 3 and follows commands. Intact to light touch in all 4 extremities  Musc: left arm in sling.   PRIOR EXAMS: Musculoskeletal:     Cervical back: Neck supple. No tenderness.     Comments: Cannot test  deltoid or biceps B/L- due to humeral fx's or triceps WE/Grip and FA 5-/5 B/L  LE's- HF 4+/5; KE on R 4+/5; NT on L due to KI; DF/PF 5/5 B/L Wearing sling on LUE- because not allowed to move L shoulder at all- and KI on LLE    Assessment/Plan: 1. Functional deficits which require 3+ hours per day of interdisciplinary therapy in a comprehensive inpatient rehab setting. Physiatrist is providing close team supervision and 24 hour management of active medical problems listed below. Physiatrist and rehab team continue to assess barriers to discharge/monitor patient progress toward functional and medical goals  Care Tool:  Bathing    Body parts bathed by patient: Left arm, Chest, Abdomen, Face   Body parts bathed by helper: Right arm, Front perineal area, Buttocks, Right upper leg, Left upper leg, Left lower leg, Right lower leg     Bathing assist Assist Level:  Maximal Assistance - Patient 24 - 49%     Upper Body Dressing/Undressing Upper body dressing   What is the patient wearing?: Hospital gown only    Upper body assist Assist Level: Moderate Assistance - Patient 50 - 74%    Lower Body Dressing/Undressing Lower body dressing      What is the patient wearing?: Hospital gown only     Lower body assist Assist for lower body dressing: Maximal Assistance - Patient 25 - 49% (MaxA to Dep)     Toileting Toileting    Toileting assist Assist for toileting: Dependent - Patient 0%     Transfers Chair/bed transfer  Transfers assist     Chair/bed transfer assist level: Moderate Assistance - Patient 50 - 74%     Locomotion Ambulation   Ambulation assist      Assist level: Moderate Assistance - Patient 50 - 74% Assistive device: No Device Max distance: 50   Walk 10 feet activity   Assist     Assist level: Moderate Assistance - Patient - 50 - 74% Assistive device: No Device   Walk 50 feet activity   Assist    Assist level: Moderate Assistance - Patient - 50 - 74% Assistive device: No Device    Walk 150 feet activity   Assist Walk 150 feet activity did not occur: Safety/medical concerns         Walk 10 feet on uneven surface  activity   Assist Walk 10 feet on uneven surfaces activity did not occur: Safety/medical concerns         Wheelchair     Assist Is the patient using a wheelchair?: Yes Type of Wheelchair: Manual    Wheelchair assist level: Dependent - Patient 0% Max wheelchair distance: 150    Wheelchair 50 feet with 2 turns activity    Assist        Assist Level: Dependent - Patient 0%   Wheelchair 150 feet activity     Assist      Assist Level: Dependent - Patient 0%   Blood pressure 134/79, pulse 80, temperature 98.5 F (36.9 C), resp. rate 18, height 5\' 9"  (1.753 m), weight 100.1 kg, SpO2 93%.    Medical Problem List and Plan: 1. Functional deficits  secondary to polytrauma after fall 2012 12/06/2022 -patient may  shower- cover KI and her incisions and L sling- if can take shower, then able to             -ELOS/Goals: 14-16 days- min A --Continue CIR therapies including PT, OT  -NWB B/L UE's- Sling in LUE  with no Shoulder ROM for 2 weeks at all -RUE- ROM as tolerated -KI with WBAT on LLE 12/17 reassured pt that we will work through restrictions/precautions. She's anxious as she was having more difficulty moving/transferring before she fell. 2.  Antithrombotics: -DVT/anticoagulation:  Pharmaceutical: Lovenox 40mg  daily check vascular study             -antiplatelet therapy: N/A 3. Pain Management: d/c hydrocodone given pruritus. Advil 400 mg scheduled TID  5. Neuropsych/cognition: This patient is capable of making decisions on her own behalf. 6. Skin/Wound Care: Routine skin checks. R eyebrow sutured 12/7, likely ready to come out 12/17 or sooner 7. Fluids/Electrolytes/Nutrition: Routine in and outs with follow-up chemistries, continue vitamins 8.  Fracture involving the distal diaphysis of the right humerus.  Status post ORIF of humeral shaft fracture 04/27/2023.  Nonweightbearing, ROM as tolerated 9.  Comminuted mildly displaced fracture of the left humeral head.  Nonoperative.  Nonweightbearing.  Shoulder sling at all times, no shoulder ROM for 2wks. 10.  Nondisplaced transverse fracture left patella.  Weightbearing as tolerated..  Knee immobilizer at all times 11.  Acute blood loss anemia.  Follow-up CBC.  Continue iron supplement 12.  Hypertension.  HCTZ 12.5 mg daily, Cozaar 50 mg daily.  Monitor with increased mobility             -12/17 bp controlled to borderline--no changes   -discussed the fact that her hyzaar is a combo med with hctz      Vitals:    05/02/23 1510 05/02/23 2036 05/03/23 0325  BP: (!) 140/69 125/72 (!) 154/83    13.  Constipation.  Colace 100 mg twice daily, MiraLAX daily.  Senokot S 2 tabs nightly. Dulcolax  suppository as needed             -05/03/23 LBM last night and this AM, monitor  -12/17 pt doesn't want miralax 14.  Hyperlipidemia.  Pravachol 20mg  daily 15.  GERD.  Protonix 40mg  daily. Add magnesium supplement HS as can be depleted by Protonix.   16. Pruritus: benadryl prn ordered  17. Anxiety: continue wellbutrin and scheduled Ativan  LOS: 3 days A FACE TO FACE EVALUATION WAS PERFORMED  Ranelle Oyster 05/05/2023, 8:47 AM

## 2023-05-05 NOTE — Progress Notes (Signed)
dPhysical Therapy Session Note  Patient Details  Name: Morgan Potter MRN: [id number] Date of Birth: [birth year]  Today's Date: 05/05/2023 PT Individual Time: 1045-1155 + 1430 - 1530 PT Individual Time Calculation (min): 70 min  + 60 min  Short Term Goals: Week 1:  PT Short Term Goal 1 (Week 1): Pt will increase bed mobility to min A PT Short Term Goal 2 (Week 1): Pt will increase transfers to min A. PT Short Term Goal 3 (Week 1): Pt will ambulate without assistive device about 100 feet with min A PT Short Term Goal 4 (Week 1): Pt will ascend/descend 1 to 2 steps with no rails and min A PT Short Term Goal 5 (Week 1): Pt will propel w/c about 50 feet with B LEs and S  Skilled Therapeutic Interventions/Progress Updates:      1st session: Pt sitting upright in wheelchair. Wearing her Bledsoe brace for LLE and RUE sling. Reports generalized, unrated pain, and that she received her pain Rx recently.   Transported at wheelchair level to main rehab gym. Completed sit<>stand with minA from w/c height and stand pivot with CGA and no AD. Cues for NWB restrictions.   Instructed in repeated sit<>stands from EOM which was set to 23" height. Completed 3x5 with rest breaks b/w sets at CGA/SBA level for safety. Pt quite anxious about putting weight through her LLE, worried about her healing patella. Explained WBAT orders and showed her the images of her XR to help her understanding the injury.   Gait training 275ft + 193ft + 185ft + 133ft + 225ft throughout the session with CGA and no AD around rehab gym - gait speed appropriate and no LOB or knee buckling observed. Limited LLE knee flexion in swing but this is limited by Bledsoe.   Initiated stair training using 3" steps to start due to patients anxiety, fear of falling, and reduced confidence in self. Completed x8 + x8 (seated rest( 3" steps, with CGA/minA with min cues for sequencing with R foot leading ascent and L leading descent. Pt surprised herself  with ability to manage stairs without too much difficulty.   Pt ended session in the bed - able to complete bed mobility without assist but ++ time for pain and management. Left with all needs met, pt made comfortable, bed alarm on.      2nd session: Pt lying in bed to start - no specific c/o pain. Wearing her LUE sling and LLE bledsoe brace.   Supine<>Sitting EOB with supervision with HOB slightly elevated. Sit<>Stand from slightly elevated EOB with on AD and CGA/minA for powering to rise. Stand pivot transfer with CGA and no AD to wheelchair with cues for safety awareness.   Transported to main rehab gym at wheelchair level for time management.   Sit<>stand from w/c height with minA for boosting up. Gait training 325ft + 229ft + 123ft throughout session with CGA and no AD around rehab spaces. Decreased LLE knee flexion in swing and wide BOS while ambulating.   Stair training using 6" steps and no hand rails. She navigated up/down x8 steps total (no rest break) with CGA/minA for stability and general safety cues.   Pt assisted onto the [place] with CGA stand pivot transfer. Completed for 10.5 minutes at L5 resistance using BLE only due to NWB BUE restrictions. Pt maintaining around ~40 spm cadence. She completed a total of 380 steps.   Stand pivot transfer with minA for boosting to rise from Nustep back to her  wheelchair. Transported to her room due to fatigue. Patient having difficulty standing from w/c height. Unable to stand despite maxA being provided ??? And several reps attempted. Eventually, after resting and helping  calm down, she was able to stand with modA and stand pivot back to the bed with CGA. Pt in tears with frustration at herself for having difficulty standing. Therapeutic listening and encouragement provided.   Left with all needs met in bed, alarm on.  Therapy Documentation Precautions:  Precautions Precautions: Shoulder, Fall Type of Shoulder Precautions: L UE avoid  passive and active shoulder ROM x 2 weeks, OK unrestricted elbow/wrist ROM Shoulder Interventions: Shoulder sling/immobilizer Precaution Booklet Issued: No Precaution Comments: R UE unrestricted ROM, L LE OK for for 0 to 60 degrees knee flexion in hinged brace Required Braces or Orthoses: Sling, Other Brace Other Brace: L LE hinged knee brace Restrictions Weight Bearing Restrictions Per Provider Order: Yes RUE Weight Bearing Per Provider Order: Non weight bearing LUE Weight Bearing Per Provider Order: Non weight bearing LLE Weight Bearing Per Provider Order: Weight bearing as tolerated Other Position/Activity Restrictions: L UE sling and no shoulder ROM, R UE ROM unrestricted; playmakers brace 0-60 L knee General:   Therapy/Group: Individual Therapy  Morgan Potter  PT, DPT, CSRS  05/05/2023, 7:48 AM

## 2023-05-05 NOTE — Progress Notes (Signed)
Occupational Therapy Session Note  Patient Details  Name: Morgan Potter MRN: [id number] Date of Birth: [birth year]  Today's Date: 05/05/2023 OT Individual Time: 0907-1000 OT Individual Time Calculation (min): 53 min    Short Term Goals: Week 1:  OT Short Term Goal 1 (Week 1): The pt will complete UB BADL task  in bathing/dress with  CGA/MinA at 95% safe using modified technique OT Short Term Goal 2 (Week 1): The pt will complete LB BADL in bathing/dressing with Mod/ MinA at 95% safe using AE at 95% safe OT Short Term Goal 3 (Week 1): The pt will tolerate 45 minutes of activity @95 % safe with intermittent rest breaks. OT Short Term Goal 4 (Week 1): The pt will improve core strength to 3+/5MMT at 95% safe for gains in functional performance.  Skilled Therapeutic Interventions/Progress Updates:  Skilled OT intervention completed with focus on ADL retraining. Pt received upright in bed on the phone, agreeable to session. No pain reported.  Pt remained tangential, intermittently labile and internally and externally distracted during session requiring frequent redirection for task progression. OT answered multiple questions regarding OT purpose, POC, DC planning including differences between hospital bed vs current bed that elevates (at her home) and [facility] therapy vs 24/7 home care. Pt also hyperfixated on her hips not fitting in the BSC width. OT advised pt to trial toileting on standard toilet for eliminating width limitations however pt with difficult receptiveness therefore OT retrieved bariatric BSC for next toileting trial.  Pt requested to wash private areas due to earlier accident with urinal. Pt required max A to doff pants, supervision for washing of front periarea, then rolled > Rt side with supervision for dependent cleaning of posterior periarea with small BM smear noted. In supine, OT unbuckled hinged LLE brace with cues needed to avoid excessive bending > 60 degrees during doffing/donning  of pants with max A then rebuckled with small adjustments made in order for adequate tightness of straps perhaps due to present edema in AM, to prevent slippage of brace during mobility. Education provided on use of reacher for threading of pants over RLE with mod cues needed for technique but overall supervision with ++ time. Pt bridged with only about 1/4 lift off for donning of pants, requiring multiple times to fully donn over hips.  Transitioned to EOB with close supervision with good recall of NWB with BUE. From elevated bed, pt able to stand with cues needed to avoid WB with RUE and placing on thigh instead, however overall CGA to power up, then CGA short ambulatory transfer without AD > w/c. Cues needed to correct LUE sling placement for further stability at elbow joint.   Seated at sink, pt needed mod A for set up of electric tooth brush and toothpaste, and min A for combing back of hair and donning in ponytail. Pt remained seated in w/c, with belt alarm on/activated, and with all needs in reach at end of session.   Therapy Documentation Precautions:  Precautions Precautions: Shoulder, Fall Type of Shoulder Precautions: L UE avoid passive and active shoulder ROM x 2 weeks, OK unrestricted elbow/wrist ROM Shoulder Interventions: Shoulder sling/immobilizer Precaution Booklet Issued: No Precaution Comments: R UE unrestricted ROM, L LE OK for for 0 to 60 degrees knee flexion in hinged brace Required Braces or Orthoses: Sling, Other Brace Other Brace: L LE hinged knee brace Restrictions Weight Bearing Restrictions Per Provider Order: Yes RUE Weight Bearing Per Provider Order: Non weight bearing LUE Weight Bearing Per Provider Order:  Non weight bearing LLE Weight Bearing Per Provider Order: Weight bearing as tolerated Other Position/Activity Restrictions: L UE sling and no shoulder ROM, R UE ROM unrestricted; playmakers brace 0-60 L knee    Therapy/Group: Individual Therapy  Melvyn Novas, MS, OTR/L  05/05/2023, 10:06 AM

## 2023-05-06 DIAGNOSIS — T07XXXA Unspecified multiple injuries, initial encounter: Secondary | ICD-10-CM | POA: Diagnosis not present

## 2023-05-06 MED ORDER — GERHARDT'S BUTT CREAM
TOPICAL_CREAM | Freq: Two times a day (BID) | CUTANEOUS | Status: DC
  Administered 2023-05-10: 1 via TOPICAL
  Filled 2023-05-06: qty 60

## 2023-05-06 NOTE — Progress Notes (Signed)
Physical Therapy Session Note  Patient Details  Name: Morgan Potter MRN: [id number] Date of Birth: 10/18/1950  Today's Date: 05/06/2023 PT Individual Time: [phone] 1132-1200 PT Individual Time Calculation (min): 58 min  + 28 min  Short Term Goals: Week 1:  PT Short Term Goal 1 (Week 1): Pt will increase bed mobility to min A PT Short Term Goal 2 (Week 1): Pt will increase transfers to min A. PT Short Term Goal 3 (Week 1): Pt will ambulate without assistive device about 100 feet with min A PT Short Term Goal 4 (Week 1): Pt will ascend/descend 1 to 2 steps with no rails and min A PT Short Term Goal 5 (Week 1): Pt will propel w/c about 50 feet with B LEs and S  Skilled Therapeutic Interventions/Progress Updates:      1st session: Pt in bed - reports she's "stuck" and can't get repositioned. LPN present for morning medications. Pt hyper verbose throughout session.   Pt able to get to EOB with supervision with bed rail lowered. Took medications while seated EOB without LOB.   Sit<>stand from raised EOB with no AD with minA. Ambulates with CGA/SBA from her room to main rehab gym, ~281ft. X1 minor LOB that was self corrected. Patient complains of feeling "stiff" and is upset with herself that she's not moving as well as yesterday. Pt in tears with her frustration - therapeutic listening and encouragement provided.   3x5 sit<>stands from EOM which was raised to 24", CGA for safety with rest breaks b/w sets. Cues for full upright posture and forward trunk lean to help with transition.   Pt assisted into supine position with supervision. Completed supine there-ex for BLE strengthening: -3x15 heel slides -3x15 hip abd/add -2x12 SLR -2x15 glut sets -1x30 ankle pumps.  MinA for returning to sitting upright from lying flat on mat table. Sit<>Stand with CGA from slightly elevated EOM, difficulty powering to rise. Ambulates with SBA and no AD from main gym back to her room, ~275ft, without LOB.  Holding a conversation throughout.  Ended treatment sitting upright in wheelchair, All needs met.   2nd session: Pt sitting up in wheelchair. No complaints of pain, just fatigue from therapies.   Donned house slippers with totalA, tight fitting and difficult to slide over her heel. Transported at wheelchair level for time management to day room rehab gym. Pt requiring minA for standing from w/c with no AD or UE to push from - pt self limiting with effort. Assisted with CGA stand pivot transfer to sit on [place], used seat belt for added safety. Resistance set to L40cm/sec. She completed x10 minutes without rest break, encouraging adequate cadence and full AROM bilaterally. CSW present to discuss team conf updates and DC date. Patient anxious re: Tuesday 12/24 date but agreeable to Thursday 12/26 date. Able to stand from [facility] with CGA and completed stand pivot with CGA and no AD - poor controlled lowering to sit to wheelchair, safety education provided.   Returned to her room. Left sitting upright with all needs met.       Therapy Documentation Precautions:  Precautions Precautions: Shoulder, Fall Type of Shoulder Precautions: L UE avoid passive and active shoulder ROM x 2 weeks, OK unrestricted elbow/wrist ROM Shoulder Interventions: Shoulder sling/immobilizer Precaution Booklet Issued: No Precaution Comments: R UE unrestricted ROM, L LE OK for for 0 to 60 degrees knee flexion in hinged brace Required Braces or Orthoses: Sling, Other Brace Other Brace: L LE hinged knee brace Restrictions Weight  Bearing Restrictions Per Provider Order: Yes RUE Weight Bearing Per Provider Order: Non weight bearing LUE Weight Bearing Per Provider Order: Non weight bearing LLE Weight Bearing Per Provider Order: Weight bearing as tolerated Other Position/Activity Restrictions: L UE sling and no shoulder ROM, R UE ROM unrestricted; playmakers brace 0-60 L knee General:     Therapy/Group: Individual  Therapy  Orrin Brigham 05/06/2023, 7:44 AM

## 2023-05-06 NOTE — Progress Notes (Signed)
Occupational Therapy Session Note  Patient Details  Name: Morgan Potter MRN: [id number] Date of Birth: 22-Jul-1950  Today's Date: 05/06/2023 OT Individual Time: 1005-1105 OT Individual Time Calculation (min): 60 min    Short Term Goals: Week 1:  OT Short Term Goal 1 (Week 1): The pt will complete UB BADL task  in bathing/dress with  CGA/MinA at 95% safe using modified technique OT Short Term Goal 2 (Week 1): The pt will complete LB BADL in bathing/dressing with Mod/ MinA at 95% safe using AE at 95% safe OT Short Term Goal 3 (Week 1): The pt will tolerate 45 minutes of activity @95 % safe with intermittent rest breaks. OT Short Term Goal 4 (Week 1): The pt will improve core strength to 3+/5MMT at 95% safe for gains in functional performance.  Skilled Therapeutic Interventions/Progress Updates:    Patient received seated in wheelchair.  Wanting help to make a phone call regarding husband's settlement.  Patient wanting to wash up - needing frequent cueing to attempt to care for self - starts each task with "I can't."  With encouragement and coaxing, patient begins to problem solve.  Patient reports pain in left shoulder with general movement needed to dress shirt - even in dependent position.  Patient indicated need to void, and needed extended time on commode, then max encouragement to complete hygiene.  Patient able to dress pants over feet, and pull to thighs. Needs continued work on sit to stand.  Replaced commode in room with elevated wide commode to increase autonomy with sit to stand transition.  Needs assist to pull pants up over left hip, and total assist to don LLE knee brace.  Left up in wheelchair with safety belt in place and engaged, personal items in reach,     Therapy Documentation Precautions:  Precautions Precautions: Shoulder, Fall Type of Shoulder Precautions: L UE avoid passive and active shoulder ROM x 2 weeks, OK unrestricted elbow/wrist ROM Shoulder Interventions: Shoulder  sling/immobilizer Precaution Booklet Issued: No Precaution Comments: R UE unrestricted ROM, L LE OK for for 0 to 60 degrees knee flexion in hinged brace Required Braces or Orthoses: Sling, Other Brace Other Brace: L LE hinged knee brace Restrictions Weight Bearing Restrictions Per Provider Order: Yes RUE Weight Bearing Per Provider Order: Non weight bearing LUE Weight Bearing Per Provider Order: Non weight bearing LLE Weight Bearing Per Provider Order: Weight bearing as tolerated Other Position/Activity Restrictions: L UE sling and no shoulder ROM, R UE ROM unrestricted; playmakers brace 0-60 L knee   Pain:  Denies pain        Therapy/Group: Individual Therapy  Collier Salina 05/06/2023, 12:55 PM

## 2023-05-06 NOTE — Progress Notes (Signed)
Occupational Therapy Session Note  Patient Details  Name: Morgan Potter MRN: [id number] Date of Birth: 10-25-1950  Today's Date: 05/06/2023 OT Individual Time: 1303-1400 OT Individual Time Calculation (min): 57 min    Short Term Goals: Week 1:  OT Short Term Goal 1 (Week 1): The pt will complete UB BADL task  in bathing/dress with  CGA/MinA at 95% safe using modified technique OT Short Term Goal 2 (Week 1): The pt will complete LB BADL in bathing/dressing with Mod/ MinA at 95% safe using AE at 95% safe OT Short Term Goal 3 (Week 1): The pt will tolerate 45 minutes of activity @95 % safe with intermittent rest breaks. OT Short Term Goal 4 (Week 1): The pt will improve core strength to 3+/5MMT at 95% safe for gains in functional performance.  Skilled Therapeutic Interventions/Progress Updates:    Patient received seated in wheelchair, needing coaxing to attend OT session - reports feeling tired.  Patient transported to gym to address shoulder movement RUE, review restrictions - NWB RUE/LUE, No active/ passive movement left shoulder, ok to move elbow/wrist LUE, No restrictions RUE.  Patient reports having prior right shoulder surgery and limitation.  She currently has ~ 110* shoulder flexion supine.  Problem solving with patient regarding home set up and equipment needs.  Patient has elevated toilet - patient has difficulty standing below 22" height.  Worked on sit to stand from 23, 22, 21 in height.  Unable to stand at 21" height without assistance.  Patient has shower seat which can be elevated so do not anticipate needing additional shower equipment.  Patient continues to need coaxing to attempt to complete self care tasks e.g. pulling up pants, completing hygiene, etc.  Able to stand without assistance from elevated wide BSC!   Patient returned to bed at end of session.  Bed alarm engaged and call bell/ personal items in reach.    Therapy Documentation Precautions:  Precautions Precautions:  Shoulder, Fall Type of Shoulder Precautions: L UE avoid passive and active shoulder ROM x 2 weeks, OK unrestricted elbow/wrist ROM Shoulder Interventions: Shoulder sling/immobilizer Precaution Booklet Issued: No Precaution Comments: R UE unrestricted ROM, L LE OK for for 0 to 60 degrees knee flexion in hinged brace Required Braces or Orthoses: Sling, Other Brace Other Brace: L LE hinged knee brace Restrictions Weight Bearing Restrictions Per Provider Order: Yes RUE Weight Bearing Per Provider Order: Non weight bearing LUE Weight Bearing Per Provider Order: Non weight bearing LLE Weight Bearing Per Provider Order: Weight bearing as tolerated Other Position/Activity Restrictions: L UE sling and no shoulder ROM, R UE ROM unrestricted; playmakers brace 0-60 L knee   Pain: Denies pain at rest, reports pain in left shoulder intermittently    Therapy/Group: Individual Therapy  Collier Salina 05/06/2023, 2:08 PM

## 2023-05-06 NOTE — Patient Care Conference (Signed)
Inpatient RehabilitationTeam Conference and Plan of Care Update Date: 05/06/2023   Time: 11:07 AM    Patient Name: Morgan Potter      Medical Record Number: [id number]  Date of Birth: [birth year] Sex: Female         Room/Bed: 4W04C/4W04C-01 Payor Info: Payor: [employer]ICARE / Plan: BC[employer] MEDICARE / Product Type: *No Product type* /    Admit Date/Time:  05/02/2023  3:09 PM  Primary Diagnosis:  Critical polytrauma  Hospital Problems: Principal Problem:   Critical polytrauma    Expected Discharge Date: Expected Discharge Date: 05/14/23  Team Members Present: Physician leading conference: Dr. Sula Soda Social Worker Present: Dossie Der, LCSW Nurse Present: Chana Bode, RN PT Present: Wynelle Link, PT OT Present: Bretta Bang, OT SLP Present: Jeannie Done, SLP PPS Coordinator present : Fae Pippin, SLP     Current Status/Progress Goal Weekly Team Focus  Bowel/Bladder   patient is continent of B/B. LBM 12/17   maintain continence   offer toileting qshift and PRN    Swallow/Nutrition/ Hydration               ADL's   max assist BADL   Mod assist BADL   Functional mobility, participation and decreased assistance with BADL    Mobility   supervision bed mobility, min to modA for sit<>stand transfers depending on surface height (NWB BUE), minA gait 160ft with no AD or UE support   CGA  general strengthening and endurance training, sit<>Stand transfers, gait training, DC planning    Communication                Safety/Cognition/ Behavioral Observations               Pain   patient complains of pain in shoulders 6-8/10   <3/10 pain score   assess pain qshif and PRN offering medication and/ice along with repositioning    Skin   bruising Bilateral UE due to the fall.   maintain skin integrity  assess skin qshift and PRN      Discharge Planning:  Home with daughter assisting aware recommend 24/7 daughter also [place] of  her four grandchildren-4 yo-16 yo. Pain and WB issues barriers to progress.   Team Discussion: Patient post poly trauma with anxiety and limited by pain and weight bearing restrictions, needs coaxing due to self limiting behaviors. Functional status fluctuates with emotions and pain.   Patient on target to meet rehab goals: yes, currently needs max assist for lower body care and mod - max for upper body care. Able to to ambulate >300' without an assistive device with SBA; difficulty getting up without use of arms.   *See Care Plan and progress notes for long and short-term goals.   Revisions to Treatment Plan:  Neuropsych consult   Teaching Needs: Safety, weight bearing restrictions, skin care, transfers, toileting, etc.  Current Barriers to Discharge: Decreased caregiver support, Home enviroment access/layout, and Weight bearing restrictions  Possible Resolutions to Barriers: Family education     Medical Summary Current Status: obesity, critical polytrauma, anxiety, pain  Barriers to Discharge: Medical stability;Weight bearing restrictions  Barriers to Discharge Comments: obesity, critical polytrauma, anxiety, pain, HTN Possible Resolutions to [employer] Focus: provide dietary education, scheduled ativan, ibupfroen scheduled, prn tramadol ordered, continue Hyzaar, continue lexapro, continue weight bearing restrictions, continue robaxin   Continued Need for Acute Rehabilitation Level of Care: The patient requires daily medical management by a physician with specialized training in physical medicine and rehabilitation  for the following reasons: Direction of a multidisciplinary physical rehabilitation program to maximize functional independence : Yes Medical management of patient stability for increased activity during participation in an intensive rehabilitation regime.: Yes Analysis of laboratory values and/or radiology reports with any subsequent need for medication adjustment  and/or medical intervention. : Yes   I attest that I was present, lead the team conference, and concur with the assessment and plan of the team.   Chana Bode B 05/06/2023, 2:13 PM

## 2023-05-06 NOTE — Progress Notes (Signed)
Patient ID: Morgan Potter, female   DOB: Sep 23, 1950, 72 y.o.   MRN: [id number]  Met with pt along with PT present to discuss team conference goals of min-CGA level and then need for 24/7 due to WB issues. She is aware as well as her daughter. Pt wants to go home on 12/26 feels needs as long as possible here to get the most benefit. Discussed equipment needs, pt only needs a 3 in 1 has adjustable bed, lift chair and toilet assist for cleaning. She becomes quite nervous when discussing discharge. Will reach out to daughter-[name] to schedule some family education prior to discharge.

## 2023-05-06 NOTE — Progress Notes (Signed)
PROGRESS NOTE   Subjective/Complaints: No new complaints this morning  Ambulating with Ephriam Knuckles Had neuropsych eval yesterday Team conference today  ROS: Patient denies fever, rash, sore throat, blurred vision, dizziness, nausea, vomiting, diarrhea, cough, shortness of breath or chest pain,  headache, or mood change.     Objective:   No results found.  Recent Labs    05/04/23 0514  WBC 8.0  HGB 10.2*  HCT 31.4*  PLT 461*   Recent Labs    05/04/23 0514  NA 136  K 4.0  CL 102  CO2 26  GLUCOSE 117*  BUN 23  CREATININE 0.87  CALCIUM 9.5    Intake/Output Summary (Last 24 hours) at 05/06/2023 1110 Last data filed at 05/06/2023 0826 Gross per 24 hour  Intake 177 ml  Output 1730 ml  Net -1553 ml        Physical Exam: Vital Signs Blood pressure (!) 146/70, pulse 77, temperature 98 F (36.7 C), resp. rate 18, height 5\' 9"  (1.753 m), weight 100.1 kg, SpO2 92%. Constitutional: No distress . Vital signs reviewed. HEENT: NCAT, EOMI, oral membranes moist Neck: supple Cardiovascular: RRR without murmur. No JVD    Respiratory/Chest: CTA Bilaterally without wheezes or rales. Normal effort    GI/Abdomen: BS +, non-tender, non-distended Ext: no clubbing, cyanosis, or edema Psych: pleasant and cooperative  Skin:    General: Skin is warm and dry.     Comments:   bruising on B/L Ue's Dressing R bicep- down entire bicep- wound C/D/I KI on LLE-   Neurological:     Mental Status: She is alert.     Comments: Patient is alert no acute distress oriented x 3 and follows commands. Intact to light touch in all 4 extremities  Musc: left arm in sling.   PRIOR EXAMS: Musculoskeletal:     Cervical back: Neck supple. No tenderness.     Comments: Cannot test deltoid or biceps B/L- due to humeral fx's or triceps WE/Grip and FA 5-/5 B/L  LE's- HF 4+/5; KE on R 4+/5; NT on L due to KI; DF/PF 5/5 B/L Wearing sling on LUE-  because not allowed to move L shoulder at all- and KI on LLE, stable 12/18   Assessment/Plan: 1. Functional deficits which require 3+ hours per day of interdisciplinary therapy in a comprehensive inpatient rehab setting. Physiatrist is providing close team supervision and 24 hour management of active medical problems listed below. Physiatrist and rehab team continue to assess barriers to discharge/monitor patient progress toward functional and medical goals  Care Tool:  Bathing    Body parts bathed by patient: Left arm, Chest, Abdomen, Face   Body parts bathed by helper: Right arm, Front perineal area, Buttocks, Right upper leg, Left upper leg, Left lower leg, Right lower leg     Bathing assist Assist Level: Maximal Assistance - Patient 24 - 49%     Upper Body Dressing/Undressing Upper body dressing   What is the patient wearing?: Hospital gown only    Upper body assist Assist Level: Moderate Assistance - Patient 50 - 74%    Lower Body Dressing/Undressing Lower body dressing      What is the patient wearing?:  Pants     Lower body assist Assist for lower body dressing: Moderate Assistance - Patient 50 - 74%     Toileting Toileting    Toileting assist Assist for toileting: Dependent - Patient 0%     Transfers Chair/bed transfer  Transfers assist     Chair/bed transfer assist level: Minimal Assistance - Patient > 75%     Locomotion Ambulation   Ambulation assist      Assist level: Contact Guard/Touching assist Assistive device: No Device Max distance: 300   Walk 10 feet activity   Assist     Assist level: Contact Guard/Touching assist Assistive device: No Device   Walk 50 feet activity   Assist    Assist level: Contact Guard/Touching assist Assistive device: No Device    Walk 150 feet activity   Assist Walk 150 feet activity did not occur: Safety/medical concerns  Assist level: Contact Guard/Touching assist Assistive device: No  Device    Walk 10 feet on uneven surface  activity   Assist Walk 10 feet on uneven surfaces activity did not occur: Safety/medical concerns   Assist level: Minimal Assistance - Patient > 75%     Wheelchair     Assist Is the patient using a wheelchair?: No Type of Wheelchair: Manual    Wheelchair assist level: Dependent - Patient 0% Max wheelchair distance: 150    Wheelchair 50 feet with 2 turns activity    Assist        Assist Level: Dependent - Patient 0%   Wheelchair 150 feet activity     Assist      Assist Level: Dependent - Patient 0%   Blood pressure (!) 146/70, pulse 77, temperature 98 F (36.7 C), resp. rate 18, height 5\' 9"  (1.753 m), weight 100.1 kg, SpO2 92%.    Medical Problem List and Plan: 1. Functional deficits secondary to polytrauma after fall 2012 12/06/2022 -patient may  shower- cover KI and her incisions and L sling- if can take shower, then able to             -ELOS/Goals: 14-16 days- min A --Continue CIR therapies including PT, OT  -NWB B/L UE's- Sling in LUE with no Shoulder ROM for 2 weeks at all -RUE- ROM as tolerated -KI with WBAT on LLE 12/17 reassured pt that we will work through restrictions/precautions. She's anxious as she was having more difficulty moving/transferring before she fell. 2.  Antithrombotics: -DVT/anticoagulation:  Pharmaceutical: Lovenox 40mg  daily check vascular study             -antiplatelet therapy: N/A 3. Pain Management: d/c hydrocodone given pruritus. Advil 400 mg scheduled TID  5. Neuropsych/cognition: This patient is capable of making decisions on her own behalf. 6. Skin/Wound Care: Routine skin checks. R eyebrow sutured 12/7, likely ready to come out 12/17 or sooner 7. Fluids/Electrolytes/Nutrition: Routine in and outs with follow-up chemistries, continue vitamins 8.  Fracture involving the distal diaphysis of the right humerus.  Status post ORIF of humeral shaft fracture 04/27/2023.   Nonweightbearing, ROM as tolerated 9.  Comminuted mildly displaced fracture of the left humeral head.  Nonoperative.  Nonweightbearing.  Shoulder sling at all times, no shoulder ROM for 2wks. 10.  Nondisplaced transverse fracture left patella.  Weightbearing as tolerated..  Knee immobilizer at all times 11.  Acute blood loss anemia.  Follow-up CBC.  Continue iron supplement 12.  Hypertension.  HCTZ 12.5 mg daily, Cozaar 50 mg daily.  Monitor with increased mobility             -  12/17 bp controlled to borderline--no changes   -discussed the fact that her hyzaar is a combo med with hctz      Vitals:    05/02/23 1510 05/02/23 2036 05/03/23 0325  BP: (!) 140/69 125/72 (!) 154/83    13.  Constipation.  Colace 100 mg twice daily, d/c miralax. continue Senokot S 2 tabs nightly. Dulcolax suppository as needed            14.  Hyperlipidemia.  Continue Pravachol 20mg  daily  15.  GERD.  Continue Protonix 40mg  daily. Add magnesium supplement HS as can be depleted by Protonix.   16. Pruritus: benadryl prn ordered, continue  17. Anxiety: continue wellbutrin and scheduled Ativan  LOS: 4 days A FACE TO FACE EVALUATION WAS PERFORMED  Machael Raine P Estefani Bateson 05/06/2023, 11:10 AM

## 2023-05-07 DIAGNOSIS — T07XXXA Unspecified multiple injuries, initial encounter: Secondary | ICD-10-CM | POA: Diagnosis not present

## 2023-05-07 MED ORDER — MAGNESIUM GLUCONATE 500 MG PO TABS
500.0000 mg | ORAL_TABLET | Freq: Every day | ORAL | Status: DC
  Administered 2023-05-07 – 2023-05-11 (×5): 500 mg via ORAL
  Filled 2023-05-07 (×5): qty 1

## 2023-05-07 NOTE — Progress Notes (Signed)
Occupational Therapy Session Note  Patient Details  Name: BRELAND KILCREASE MRN: [id number] Date of Birth: 04-Feb-1951  Today's Date: 05/07/2023 OT Individual Time: 4098-1191 OT Individual Time Calculation (min): 76 min    Short Term Goals: Week 1:  OT Short Term Goal 1 (Week 1): The pt will complete UB BADL task  in bathing/dress with  CGA/MinA at 95% safe using modified technique OT Short Term Goal 2 (Week 1): The pt will complete LB BADL in bathing/dressing with Mod/ MinA at 95% safe using AE at 95% safe OT Short Term Goal 3 (Week 1): The pt will tolerate 45 minutes of activity @95 % safe with intermittent rest breaks. OT Short Term Goal 4 (Week 1): The pt will improve core strength to 3+/5MMT at 95% safe for gains in functional performance.  Skilled Therapeutic Interventions/Progress Updates:    Patient received supine in bed.  Agreeable to OT session.  Patient needing to express feelings of frustration regarding yesterday's session in which she was encouraged to attempt to stand from lower than 23" surface.  Patient indicates her nurse and she talked through this and she is feeling better about things.   Agreeable to shower.   Walked to bathroom indicating need to void.  Continent of BM, and able to complete hygiene and get up from [facility]!  Walked to shower and sat on elevated shower seat.  Patient able to wash/ condition 90% hair, wash face, chest, legs, periarea - needs assist for back, axilla.  Problem solved means to increase autonomy with basic self care.  Patient frequently states "my daughter will just have to do it."   Patient assisted to dress while seated on shower seat.  Needs help for donning knee brace and left sock.  Patient unable to fully pull up clothing over left hip.   Completed grooming at sink - able to brush 90% hair!   Significant improvement in BADL participation.  Reinforced with patient need for family training with her daughter prior to discharge.   Left up in wheelchair  with personal items in reach.    Therapy Documentation Precautions:  Precautions Precautions: Shoulder, Fall Type of Shoulder Precautions: L UE avoid passive and active shoulder ROM x 2 weeks, OK unrestricted elbow/wrist ROM Shoulder Interventions: Shoulder sling/immobilizer Precaution Booklet Issued: No Precaution Comments: R UE unrestricted ROM, L LE OK for for 0 to 60 degrees knee flexion in hinged brace Required Braces or Orthoses: Sling, Other Brace Other Brace: L LE hinged knee brace Restrictions Weight Bearing Restrictions Per Provider Order: Yes RUE Weight Bearing Per Provider Order: Non weight bearing LUE Weight Bearing Per Provider Order: Non weight bearing LLE Weight Bearing Per Provider Order: Weight bearing as tolerated Other Position/Activity Restrictions: L UE sling and no shoulder ROM, R UE ROM unrestricted; playmakers brace 0-60 L knee   Pain:  Reports pain in left shoulder with functional mobility        Therapy/Group: Individual Therapy  Collier Salina 05/07/2023, 12:24 PM

## 2023-05-07 NOTE — Progress Notes (Addendum)
Patient ID: Morgan Potter, female   DOB: 06/24/1950, 71 y.o.   MRN: [id number]  Met with pt who did not realize she will have no therapy on Wednesday although was told and now wants to go home on 12/24. Team fine with this along with MD. Discussed she will need to get wide bedside commode if this is one what she wants. Insurance will not cover this one. Gave her [employer] with them. Discussed OP versus HH and she feels can do OP once can WB and drive but wants home health for a few visits to make sure moving well. No preference will look for [facility] agency to accept her referral.   1:00 PM [facility] accepted the referral will reach out to pt once discharged home

## 2023-05-07 NOTE — Plan of Care (Signed)
Pt's plan of care adjusted to 15/7 after speaking with care team and discussed with MD in team conference as pt currently unable to tolerate current therapy schedule with OT, and PT. 

## 2023-05-07 NOTE — Progress Notes (Signed)
Physical Therapy Session Note  Patient Details  Name: Morgan Potter MRN: [id number] Date of Birth: 1950/11/20  Today's Date: 05/07/2023 PT Individual Time: 2130-8657 + 1445-1554 PT Individual Time Calculation (min): 73 min  + 69 min  Short Term Goals: Week 1:  PT Short Term Goal 1 (Week 1): Pt will increase bed mobility to min A PT Short Term Goal 2 (Week 1): Pt will increase transfers to min A. PT Short Term Goal 3 (Week 1): Pt will ambulate without assistive device about 100 feet with min A PT Short Term Goal 4 (Week 1): Pt will ascend/descend 1 to 2 steps with no rails and min A PT Short Term Goal 5 (Week 1): Pt will propel w/c about 50 feet with B LEs and S  Skilled Therapeutic Interventions/Progress Updates:      1st session: Pt lying in bed with LPN present. Pt tearful and emotional - upset that she feels therapy is pushing her too hard while attempting to stand from lower surfaces. Pt requesting to avoid doing this activity while she's here - reports she couldn't stand from low surfaces PTA, uses a lift chair at home. MD aware and primary OT aware. She also requests to keep therapy sessions light and avoid strenuous activity. Discussed and feel that patient would benefit from 15/7 POC - patient made aware of recommendation.   We also discussed her DC date (Thursday 12/26) and reviewed that there is no therapy on Christmas Day (Wednesday 12/25) or day of DC (12/26). So now patient is considering DC on Tuesday 12/24 but she wants to decide by the end of the day.  Supine<>Sitting EOB with supervision with HOB elevated. Sit<>stand from raised EOB height with no AD and supervision assist. Ambulates with SBA and no AD from her room to day room rehab gym, ~169ft. Pt had x2 minor LOB that were self corrected. Her bledsoe brace on LLE begins to slide off after ambulating - sitting on elevated mat table to readjust and reposition Bledsoe.   Assisted onto Nustep and she completed 13 minutes at  L5 resistance, using her BLE only due to NWB BUE. She completed at a cadence of ~40-45 spm, achieving a total of 593 steps.   Pt requiring minA to stand from Nustep due to low height. Ambulatory transfer with CGA and no AD to mat table. Sit>supine on mat table with supervision. Pillows provided for comfort. Completed mat table there-ex for BLE strengthening: -1x15 glut sets -1x30 ankle pumps -1x15 hamstring ext (isometrics) -1x20 SAQ with cylinder bolster  MinA for returning upright to sitting for trunk support. Sit<>Stand from raised EOB with supervision and she ambulates back to her room with SBA and no AD 123ft. Returned to bed to allow her to rest before her next therapy. She needed assist for repositioning the Bledsoe brace as it continues to slide down her leg. Pt calling her daughter, Morgan Potter, at end of session to discuss changing DC date from 12/26 to 12/24 - daughter in agreement. Will relay to team via secure chat. All needs met at end of treatment.      2nd session: Pt sitting up in wheelchair and agreeable to therapy treatment. Pt requires modA for standing from wheelchair height, some self limiting behavior with the lower surface. Ambulates with CGA and SBA from her room to main rehab gym, ~227ft, with no LOB or knee buckling. Continued gait training and therapeutic activity by instructing in long distance ambulation from main rehab gym downstairs (standing rest  break in elevator) from CIR floor to [facility] with SBA and no AD. Ambulated around the gift shop, through tight spaces and unfamiliar environment, with SBA. She also ambulated around [employer] but she deferred sitting in any of the public chairs due to her fear of being unable to get up. Offered higher chairs but she continued to defer so she didn't have a single sitting rest break while ambulating around hospital lobby. Returned upstairs (via Engineer, structural) to [employer] floor. Assisted onto Nustep and she completed 14 minute using BLE  only, resistance set to L6. Achieved 532 steps total. Ambulated back to her room with SBA and no AD, ~236ft, continues to be slightly unsteady but no formal LOB. Increased unsteadiness as she becomes more fatigued. Session ended with her in bed, all needs met.   Therapy Documentation Precautions:  Precautions Precautions: Shoulder, Fall Type of Shoulder Precautions: L UE avoid passive and active shoulder ROM x 2 weeks, OK unrestricted elbow/wrist ROM Shoulder Interventions: Shoulder sling/immobilizer Precaution Booklet Issued: No Precaution Comments: R UE unrestricted ROM, L LE OK for for 0 to 60 degrees knee flexion in hinged brace Required Braces or Orthoses: Sling, Other Brace Other Brace: L LE hinged knee brace Restrictions Weight Bearing Restrictions Per Provider Order: Yes RUE Weight Bearing Per Provider Order: Non weight bearing LUE Weight Bearing Per Provider Order: Non weight bearing LLE Weight Bearing Per Provider Order: Weight bearing as tolerated Other Position/Activity Restrictions: L UE sling and no shoulder ROM, R UE ROM unrestricted; playmakers brace 0-60 L knee General:      Therapy/Group: Individual Therapy  Orrin Brigham 05/07/2023, 7:37 AM

## 2023-05-07 NOTE — Progress Notes (Signed)
PROGRESS NOTE   Subjective/Complaints: C/o heaviness in her legs- somewhat chronic but worsened after her injury- worst in her quads when attempting to flex knees Decreased to 15/7 due to limited tolerance  ROS: Patient denies fever, rash, sore throat, blurred vision, dizziness, nausea, vomiting, diarrhea, cough, shortness of breath or chest pain,  headache, or mood change. +bilateral quadriceps weakness    Objective:   No results found.  No results for input(s): "WBC", "HGB", "HCT", "PLT" in the last 72 hours.  No results for input(s): "NA", "K", "CL", "CO2", "GLUCOSE", "BUN", "CREATININE", "CALCIUM" in the last 72 hours.   Intake/Output Summary (Last 24 hours) at 05/07/2023 1039 Last data filed at 05/07/2023 0819 Gross per 24 hour  Intake 240 ml  Output 1450 ml  Net -1210 ml        Physical Exam: Vital Signs Blood pressure (!) 154/83, pulse 76, temperature 98.2 F (36.8 C), resp. rate 18, height 5\' 9"  (1.753 m), weight 100.1 kg, SpO2 95%. Constitutional: No distress . Vital signs reviewed. HEENT: NCAT, EOMI, oral membranes moist Neck: supple Cardiovascular: RRR without murmur. No JVD    Respiratory/Chest: CTA Bilaterally without wheezes or rales. Normal effort    GI/Abdomen: BS +, non-tender, non-distended Ext: no clubbing, cyanosis, or edema Psych: pleasant and cooperative  Skin:    General: Skin is warm and dry.     Comments:   bruising on B/L Ue's Dressing R bicep- down entire bicep- wound C/D/I KI on LLE-   Neurological:     Mental Status: She is alert.     Comments: Patient is alert no acute distress oriented x 3 and follows commands. Intact to light touch in all 4 extremities  Musc: left arm in sling. Musculoskeletal:     Cervical back: Neck supple. No tenderness.     Comments: Cannot test deltoid or biceps B/L- due to humeral fx's or triceps WE/Grip and FA 5-/5 B/L  LE's- HF 4/5; KE on R 4+/5; NT  on L due to KI; DF/PF 5/5 B/L Wearing sling on LUE- because not allowed to move L shoulder at all- and KI on LLE, stable 12/19   Assessment/Plan: 1. Functional deficits which require 3+ hours per day of interdisciplinary therapy in a comprehensive inpatient rehab setting. Physiatrist is providing close team supervision and 24 hour management of active medical problems listed below. Physiatrist and rehab team continue to assess barriers to discharge/monitor patient progress toward functional and medical goals  Care Tool:  Bathing    Body parts bathed by patient: Left arm, Chest, Abdomen, Face   Body parts bathed by helper: Right arm, Front perineal area, Buttocks, Right upper leg, Left upper leg, Left lower leg, Right lower leg     Bathing assist Assist Level: Maximal Assistance - Patient 24 - 49%     Upper Body Dressing/Undressing Upper body dressing   What is the patient wearing?: Hospital gown only    Upper body assist Assist Level: Moderate Assistance - Patient 50 - 74%    Lower Body Dressing/Undressing Lower body dressing      What is the patient wearing?: Pants     Lower body assist Assist for lower body  dressing: Moderate Assistance - Patient 50 - 74%     Toileting Toileting    Toileting assist Assist for toileting: Dependent - Patient 0%     Transfers Chair/bed transfer  Transfers assist     Chair/bed transfer assist level: Minimal Assistance - Patient > 75%     Locomotion Ambulation   Ambulation assist      Assist level: Contact Guard/Touching assist Assistive device: No Device Max distance: 300   Walk 10 feet activity   Assist     Assist level: Contact Guard/Touching assist Assistive device: No Device   Walk 50 feet activity   Assist    Assist level: Contact Guard/Touching assist Assistive device: No Device    Walk 150 feet activity   Assist Walk 150 feet activity did not occur: Safety/medical concerns  Assist level:  Contact Guard/Touching assist Assistive device: No Device    Walk 10 feet on uneven surface  activity   Assist Walk 10 feet on uneven surfaces activity did not occur: Safety/medical concerns   Assist level: Minimal Assistance - Patient > 75%     Wheelchair     Assist Is the patient using a wheelchair?: No Type of Wheelchair: Manual    Wheelchair assist level: Dependent - Patient 0% Max wheelchair distance: 150    Wheelchair 50 feet with 2 turns activity    Assist        Assist Level: Dependent - Patient 0%   Wheelchair 150 feet activity     Assist      Assist Level: Dependent - Patient 0%   Blood pressure (!) 154/83, pulse 76, temperature 98.2 F (36.8 C), resp. rate 18, height 5\' 9"  (1.753 m), weight 100.1 kg, SpO2 95%.    Medical Problem List and Plan: 1. Functional deficits secondary to polytrauma after fall 2012 12/06/2022 -patient may  shower- cover KI and her incisions and L sling- if can take shower, then able to             -ELOS/Goals: 14-16 days- min A --Continue CIR therapies including PT, OT  -NWB B/L UE's- Sling in LUE with no Shoulder ROM for 2 weeks at all -RUE- ROM as tolerated -KI with WBAT on LLE 12/17 reassured pt that we will work through restrictions/precautions. She's anxious as she was having more difficulty moving/transferring before she fell. -decreased to 15/7 2.  Antithrombotics: -DVT/anticoagulation:  Pharmaceutical: Lovenox 40mg  daily check vascular study             -antiplatelet therapy: N/A 3. Pain Management: d/c hydrocodone given pruritus. Advil 400 mg scheduled TID  5. Neuropsych/cognition: This patient is capable of making decisions on her own behalf. 6. Skin/Wound Care: Routine skin checks. R eyebrow sutured 12/7, likely ready to come out 12/17 or sooner 7. Fluids/Electrolytes/Nutrition: Routine in and outs with follow-up chemistries, continue vitamins 8.  Fracture involving the distal diaphysis of the right  humerus.  Status post ORIF of humeral shaft fracture 04/27/2023.  Nonweightbearing, ROM as tolerated 9.  Comminuted mildly displaced fracture of the left humeral head.  Nonoperative.  Nonweightbearing.  Shoulder sling at all times, no shoulder ROM for 2wks. 10.  Nondisplaced transverse fracture left patella.  Weightbearing as tolerated..  Knee immobilizer at all times 11.  Acute blood loss anemia.  Follow-up CBC.  Continue iron supplement 12.  Hypertension.  HCTZ 12.5 mg daily, Cozaar 50 mg daily.  Monitor with increased mobility   -discussed the fact that her hyzaar is a combo med with hydrochlorothiazide,  increase magnesium to 500mg  HS      Vitals:    05/02/23 1510 05/02/23 2036 05/03/23 0325  BP: (!) 140/69 125/72 (!) 154/83    13.  Constipation.  Colace 100 mg twice daily, d/c miralax. continue Senokot S 2 tabs nightly. D/c suppositorty            14.  Hyperlipidemia.  Continue Pravachol 20mg  daily  15.  GERD.  Continue Protonix 40mg  daily. Add magnesium supplement HS as can be depleted by Protonix.   16. Pruritus: benadryl prn ordered, continue  17. Anxiety: continue wellbutrin and scheduled Ativan  LOS: 5 days A FACE TO FACE EVALUATION WAS PERFORMED  Drema Pry Delani Kohli 05/07/2023, 10:39 AM

## 2023-05-08 ENCOUNTER — Other Ambulatory Visit: Payer: Self-pay

## 2023-05-08 ENCOUNTER — Encounter (HOSPITAL_COMMUNITY): Payer: Self-pay | Admitting: Physical Medicine and Rehabilitation

## 2023-05-08 DIAGNOSIS — T07XXXA Unspecified multiple injuries, initial encounter: Secondary | ICD-10-CM | POA: Diagnosis not present

## 2023-05-08 MED ORDER — MAGNESIUM HYDROXIDE 400 MG/5ML PO SUSP
15.0000 mL | Freq: Once | ORAL | Status: DC
  Filled 2023-05-08: qty 30

## 2023-05-08 NOTE — Progress Notes (Addendum)
Physical Therapy Session Note  Patient Details  Name: Morgan Potter MRN: [id number] Date of Birth: 04/12/1951  Today's Date: 05/08/2023 PT Individual Time: 0845-1000 + 1345-1443 PT Individual Time Calculation (min): 75 min  + 58 min  Short Term Goals: Week 1:  PT Short Term Goal 1 (Week 1): Pt will increase bed mobility to min A PT Short Term Goal 2 (Week 1): Pt will increase transfers to min A. PT Short Term Goal 3 (Week 1): Pt will ambulate without assistive device about 100 feet with min A PT Short Term Goal 4 (Week 1): Pt will ascend/descend 1 to 2 steps with no rails and min A PT Short Term Goal 5 (Week 1): Pt will propel w/c about 50 feet with B LEs and S  Skilled Therapeutic Interventions/Progress Updates:      1st session: Pt resting in bed to start - agreeable to PT tx. Has generalized pain - rest breaks and mobility for pain management.   Supine<>sitting EOB with supervision. Sit<>Stand from raised EOB with supervision and no AD. Ambulates with supervision and no AD from her room to day room rehab gym, ~176ft. Patient c/o R foot and arc pain - discussed recommendation for wearing tennis shoes to help with comfort and stability. Discussed she's been walking bare foot since CIR stay - pt agreeable to try tennis shoes.  Donned with totalA - needing assist for tieing them due to BUE restrictions.   Gait training with tennis shoes on, ambulating 121ft with supervision and noAD - pt reports improved foot pain and better balance.  Patient completed seated and supine there-ex for BLE strengthening: -2x10 LAQ with 5sec knee ext isometric -2x15 alternating hip marches -2x20 seated calf raises -2x15 supine SLR  -2x15 heel slides -2x15 glut sets -2x15 bridges -2x10 hip abd/add  Pt ambulated back to her room with supervision and no AD, slightly unsteady with turns 2/2 fatigue. 132ft in distance. Left sitting EOB with alarm on - deferred sitting in wheelchair due to difficulty  standing from low surface. All needs met.     2nd session: Pt lying in bed and agreeable to therapy. LPN present for medications.   Supine<>sitting EOB with supervision. Already wearing her Bledsoe brace on her LLE. Sit<>stand from raised EOB with supervision and no AD. Ambulates with supervision and no AD 145ft to day room rehab gym. Assisted on Kinetron and completed for x8 minutes at L56m/sec resistance - encouraging full AROM bilaterally and achieving adequate cadence to challenge activity tolerance. Gait training ~146ft to main rehab gym with supervision and no AD. Completed stair training using 6" steps and no hand rails. CGA for navigating x12 steps total.   Pt provided with HEP handout and reviewed how to access via QR code - pt voiced understanding. See below for HEP exercises and dosage.   Pt completed car transfer with car height simulating their Expedition - reports there is a running board for her to use to help enter and exit the vehicle - this will be helpful given her NWB BUE precautions, and difficulty sliding her hips posteriorly.   Ambulated back to her room from ortho gym, ~237ft. No LOB or knee buckling. Bed mobility completed mod I. All needs met.    Access Code: [email or web address] URL: [email or web address] Date: 05/08/2023 Prepared by: Wynelle Link  Exercises - Beginner Bridge  - 1 x daily - 7 x weekly - 3 sets - 10 reps - Supine March  - 1 x daily - 7 x weekly -  3 sets - 10 reps - Clamshell  - 1 x daily - 7 x weekly - 3 sets - 10 reps - Supine Ankle Pumps  - 1 x daily - 7 x weekly - 3 sets - 10 reps - Supine Quad Set  - 1 x daily - 7 x weekly - 3 sets - 10 reps - Supine Gluteal Sets  - 1 x daily - 7 x weekly - 3 sets - 10 reps - Supine Heel Slide  - 1 x daily - 7 x weekly - 3 sets - 10 reps - Supine Hip Abduction  - 1 x daily - 7 x weekly - 3 sets - 10 reps  Therapy Documentation Precautions:  Precautions Precautions: Shoulder, Fall Type of  Shoulder Precautions: L UE avoid passive and active shoulder ROM x 2 weeks, OK unrestricted elbow/wrist ROM Shoulder Interventions: Shoulder sling/immobilizer Precaution Booklet Issued: No Precaution Comments: R UE unrestricted ROM, L LE OK for for 0 to 60 degrees knee flexion in hinged brace Required Braces or Orthoses: Sling, Other Brace Other Brace: L LE hinged knee brace Restrictions Weight Bearing Restrictions Per Provider Order: Yes RUE Weight Bearing Per Provider Order: Non weight bearing LUE Weight Bearing Per Provider Order: Non weight bearing LLE Weight Bearing Per Provider Order: Weight bearing as tolerated Other Position/Activity Restrictions: L UE sling and no shoulder ROM, R UE ROM unrestricted; playmakers brace 0-60 L knee General:     Therapy/Group: Individual Therapy  Orrin Brigham 05/08/2023, 7:38 AM

## 2023-05-08 NOTE — Progress Notes (Signed)
Occupational Therapy Weekly Progress Note  Patient Details  Name: Morgan Potter MRN: [id number] Date of Birth: [birth year]  Beginning of progress report period: May 01, 2023 End of progress report period: May 08, 2023  Today's Date: 05/08/2023 OT Individual Time: 4742-5956 OT Individual Time Calculation (min): 29 min    Patient has met 4 of 4 short term goals.  Improved functional use of RUE to aide with BADL, Improved functional mobility, improved overall strength and activity tolerance  Patient continues to demonstrate the following deficits: muscle weakness and therefore will continue to benefit from skilled OT intervention to enhance overall performance with BADL and Reduce care partner burden.  Patient progressing toward long term goals..  Continue plan of care.  OT Short Term Goals Week 1:  OT Short Term Goal 1 (Week 1): The pt will complete UB BADL task  in bathing/dress with  CGA/MinA at 95% safe using modified technique OT Short Term Goal 1 - Progress (Week 1): Met OT Short Term Goal 2 (Week 1): The pt will complete LB BADL in bathing/dressing with Mod/ MinA at 95% safe using AE at 95% safe OT Short Term Goal 2 - Progress (Week 1): Met OT Short Term Goal 3 (Week 1): The pt will tolerate 45 minutes of activity @95 % safe with intermittent rest breaks. OT Short Term Goal 3 - Progress (Week 1): Met OT Short Term Goal 4 (Week 1): The pt will improve core strength to 3+/5MMT at 95% safe for gains in functional performance. OT Short Term Goal 4 - Progress (Week 1): Met Week 2:  OT Short Term Goal 1 (Week 2): STG= LTG due to LOS  Skilled Therapeutic Interventions/Progress Updates:    Patient received supine in bed, agreeable to OT session.  Patient indicated need to have BM.  Walked to bathroom with supervision standing from elevated bed.  Patient able to complete hygiene following toileting, only needing assistance to manage pants over left hip.  Patient pleased with  progress, and inquiring about potential early discharge.  Patient wanting to talk with daughter to see if able to have her return home Sunday.  No family education done at this point.  Would recommend providing education to daughter prior to discharge.  Patient returned to bed at end of session with personal items in reach.    Therapy Documentation Precautions:  Precautions Precautions: Shoulder, Fall Type of Shoulder Precautions: L UE avoid passive and active shoulder ROM x 2 weeks, OK unrestricted elbow/wrist ROM Shoulder Interventions: Shoulder sling/immobilizer Precaution Booklet Issued: No Precaution Comments: R UE unrestricted ROM, L LE OK for for 0 to 60 degrees knee flexion in hinged brace Required Braces or Orthoses: Sling, Other Brace Other Brace: L LE hinged knee brace Restrictions Weight Bearing Restrictions Per Provider Order: Yes RUE Weight Bearing Per Provider Order: Non weight bearing LUE Weight Bearing Per Provider Order: Non weight bearing LLE Weight Bearing Per Provider Order: Weight bearing as tolerated Other Position/Activity Restrictions: L UE sling and no shoulder ROM, R UE ROM unrestricted; playmakers brace 0-60 L knee  Pain: Pain Assessment Pain Scale: 0-10 Pain Score: 0-No pain    Therapy/Group: Individual Therapy  Collier Salina 05/08/2023, 12:27 PM

## 2023-05-08 NOTE — Progress Notes (Signed)
PROGRESS NOTE   Subjective/Complaints: No new complaints this morning Happy to be decreased to 15/7 Happy to be able to ho home earlier Working hard with therapy   ROS: Patient denies fever, rash, sore throat, blurred vision, dizziness, nausea, vomiting, diarrhea, cough, shortness of breath or chest pain,  headache, or mood change. +bilateral quadriceps weakness    Objective:   No results found.  No results for input(s): "WBC", "HGB", "HCT", "PLT" in the last 72 hours.  No results for input(s): "NA", "K", "CL", "CO2", "GLUCOSE", "BUN", "CREATININE", "CALCIUM" in the last 72 hours.   Intake/Output Summary (Last 24 hours) at 05/08/2023 1112 Last data filed at 05/08/2023 0837 Gross per 24 hour  Intake 720 ml  Output 1500 ml  Net -780 ml        Physical Exam: Vital Signs Blood pressure (!) 148/73, pulse 82, temperature 98.5 F (36.9 C), resp. rate 18, height 5\' 9"  (1.753 m), weight 100.1 kg, SpO2 93%. Constitutional: No distress . Vital signs reviewed. HEENT: NCAT, EOMI, oral membranes moist Neck: supple Cardiovascular: RRR without murmur. No JVD    Respiratory/Chest: CTA Bilaterally without wheezes or rales. Normal effort    GI/Abdomen: BS +, non-tender, non-distended Ext: no clubbing, cyanosis, or edema Psych: pleasant and cooperative  Skin:    General: Skin is warm and dry.     Comments:   bruising on B/L Ue's Dressing R bicep- down entire bicep- wound C/D/I KI on LLE-   Neurological:     Mental Status: She is alert.     Comments: Patient is alert no acute distress oriented x 3 and follows commands. Intact to light touch in all 4 extremities  Musc: left arm in sling. Musculoskeletal:     Cervical back: Neck supple. No tenderness.     Comments: Cannot test deltoid or biceps B/L- due to humeral fx's or triceps WE/Grip and FA 5-/5 B/L  LE's- HF 4/5; KE on R 4+/5; NT on L due to KI; DF/PF 5/5 B/L Wearing  sling on LUE- because not allowed to move L shoulder at all- and KI on LLE, stable 12/20   Assessment/Plan: 1. Functional deficits which require 3+ hours per day of interdisciplinary therapy in a comprehensive inpatient rehab setting. Physiatrist is providing close team supervision and 24 hour management of active medical problems listed below. Physiatrist and rehab team continue to assess barriers to discharge/monitor patient progress toward functional and medical goals  Care Tool:  Bathing    Body parts bathed by patient: Left arm, Chest, Abdomen, Face   Body parts bathed by helper: Right arm, Front perineal area, Buttocks, Right upper leg, Left upper leg, Left lower leg, Right lower leg     Bathing assist Assist Level: Maximal Assistance - Patient 24 - 49%     Upper Body Dressing/Undressing Upper body dressing   What is the patient wearing?: Hospital gown only    Upper body assist Assist Level: Moderate Assistance - Patient 50 - 74%    Lower Body Dressing/Undressing Lower body dressing      What is the patient wearing?: Pants     Lower body assist Assist for lower body dressing: Moderate Assistance -  Patient 50 - 74%     Toileting Toileting    Toileting assist Assist for toileting: Dependent - Patient 0%     Transfers Chair/bed transfer  Transfers assist     Chair/bed transfer assist level: Minimal Assistance - Patient > 75%     Locomotion Ambulation   Ambulation assist      Assist level: Contact Guard/Touching assist Assistive device: No Device Max distance: 300   Walk 10 feet activity   Assist     Assist level: Contact Guard/Touching assist Assistive device: No Device   Walk 50 feet activity   Assist    Assist level: Contact Guard/Touching assist Assistive device: No Device    Walk 150 feet activity   Assist Walk 150 feet activity did not occur: Safety/medical concerns  Assist level: Contact Guard/Touching assist Assistive  device: No Device    Walk 10 feet on uneven surface  activity   Assist Walk 10 feet on uneven surfaces activity did not occur: Safety/medical concerns   Assist level: Minimal Assistance - Patient > 75%     Wheelchair     Assist Is the patient using a wheelchair?: No Type of Wheelchair: Manual    Wheelchair assist level: Dependent - Patient 0% Max wheelchair distance: 150    Wheelchair 50 feet with 2 turns activity    Assist        Assist Level: Dependent - Patient 0%   Wheelchair 150 feet activity     Assist      Assist Level: Dependent - Patient 0%   Blood pressure (!) 148/73, pulse 82, temperature 98.5 F (36.9 C), resp. rate 18, height 5\' 9"  (1.753 m), weight 100.1 kg, SpO2 93%.    Medical Problem List and Plan: 1. Functional deficits secondary to polytrauma after fall 2012 12/06/2022 -patient may  shower- cover KI and her incisions and L sling- if can take shower, then able to             -ELOS/Goals: 14-16 days- min A --Continue CIR therapies including PT, OT  -NWB B/L UE's- Sling in LUE with no Shoulder ROM for 2 weeks at all -RUE- ROM as tolerated -KI with WBAT on LLE 12/17 reassured pt that we will work through restrictions/precautions. She's anxious as she was having more difficulty moving/transferring before she fell. -decreased to 15/7 2.  Antithrombotics: -DVT/anticoagulation:  Pharmaceutical: Lovenox 40mg  daily check vascular study             -antiplatelet therapy: N/A 3. Pain Management: d/c hydrocodone given pruritus. Advil 400 mg scheduled TID  5. Neuropsych/cognition: This patient is capable of making decisions on her own behalf. 6. Skin/Wound Care: Routine skin checks. R eyebrow sutured 12/7, likely ready to come out 12/17 or sooner 7. Fluids/Electrolytes/Nutrition: Routine in and outs with follow-up chemistries, continue vitamins 8.  Fracture involving the distal diaphysis of the right humerus.  Status post ORIF of humeral shaft  fracture 04/27/2023.  Nonweightbearing, ROM as tolerated 9.  Comminuted mildly displaced fracture of the left humeral head.  Nonoperative.  Nonweightbearing.  Shoulder sling at all times, no shoulder ROM for 2wks. 10.  Nondisplaced transverse fracture left patella.  Weightbearing as tolerated..  Knee immobilizer at all times 11.  Acute blood loss anemia.  Follow-up CBC.  Continue iron supplement 12.  Hypertension.  HCTZ 12.5 mg daily, Cozaar 50 mg daily.  Monitor with increased mobility   -discussed the fact that her hyzaar is a combo med with hydrochlorothiazide, increase magnesium to 500mg   HS      Vitals:    05/02/23 1510 05/02/23 2036 05/03/23 0325  BP: (!) 140/69 125/72 (!) 154/83    13.  Constipation.  Colace 100 mg twice daily, d/c miralax. continue Senokot S 2 tabs nightly. D/c suppositorty. Milk of mag ordered 12/20            14.  Hyperlipidemia.  Continue Pravachol 20mg  daily  15.  GERD.  continue Protonix 40mg  daily. Add magnesium supplement HS as can be depleted by Protonix.   16. Pruritus: benadryl prn ordered, continue  17. Anxiety: continue wellbutrin and scheduled Ativan  LOS: 6 days A FACE TO FACE EVALUATION WAS PERFORMED  Morgan Potter P Aemilia Dedrick 05/08/2023, 11:12 AM

## 2023-05-08 NOTE — Plan of Care (Signed)
DC goals - patient will be functional ambulator and not using wheelchair at DC   Problem: RH Wheelchair Mobility Goal: LTG Patient will propel w/c in controlled environment (PT) Description: LTG: Patient will propel wheelchair in controlled environment, # of feet with assist (PT) Outcome: Not Applicable Goal: LTG Patient will propel w/c in home environment (PT) Description: LTG: Patient will propel wheelchair in home environment, # of feet with assistance (PT). Outcome: Not Applicable

## 2023-05-08 NOTE — Progress Notes (Signed)
Patient ID: Morgan Potter, female   DOB: 11-Nov-1950, 72 y.o.   MRN: [id number]  Let pt know her daughter never called this worker back regarding family education. Pt reports her daughter has a lot on her plate with her job the kids and now her. She is fine with her not coming in for therapies, she feels she can direct her care at home.

## 2023-05-09 DIAGNOSIS — I10 Essential (primary) hypertension: Secondary | ICD-10-CM | POA: Diagnosis not present

## 2023-05-09 DIAGNOSIS — K5901 Slow transit constipation: Secondary | ICD-10-CM | POA: Diagnosis not present

## 2023-05-09 DIAGNOSIS — T07XXXA Unspecified multiple injuries, initial encounter: Secondary | ICD-10-CM | POA: Diagnosis not present

## 2023-05-09 NOTE — Progress Notes (Signed)
Occupational Therapy Session Note  Patient Details  Name: Morgan Potter MRN: [id number] Date of Birth: 1951-04-24  Today's Date: 05/09/2023 OT Individual Time: 0920-1000 OT Individual Time Calculation (min): 40 min    Short Term Goals: Week 2:  OT Short Term Goal 1 (Week 2): STG= LTG due to LOS  Skilled Therapeutic Interventions/Progress Updates:  Skilled OT intervention completed with focus on ambulatory endurance, activity tolerance, cervical and RUE ROM. Pt received supine in bed, agreeable to session. No pain reported.   Pt very particular, desiring to direct her own care, including refusing to stand from EOB and EOM at reasonable height (already elevated higher than most surfaces in a normal home), and with frequent rest breaks due to some self-limiting behaviors.  LUE sling already on. Transitioned to EOB with mod I, good recall of NWB with BUE. Pt reported red spot on Lt medial thigh where bledsoe brace has rubbed; OT applied foam dressing for skin integrity and readjusted brace due to loose fit in prep for mobility. Pt encouraged to donn slip on tennis shoes. With long handled shoe horn (which pt was frustrated about needing) pt was able to donn both shoes with + time with min A only for Lt heel.   Completed all sit > stands (from highly elevated surfaces only per pt demand) and ambulatory transfers without AD and overall supervision, no LOB. Ambulated > gym, seated rest provided at [facility] mat; pt refused trial of sitting in standard chair that would be closer to what she has at home.  Standing at table top, pt completed the following activities to promote functional use of RUE needed for independence with BADLs: Table slides on LUE/BUE (x15) -shoulder flexion -horizontal abduction -shoulder external rotation Pendulum swing RUE x15 HEP issued  Ambulated back to room, transitioned into bed with mod I. Pt remained semi supine in bed, with bed alarm on/activated, and with all needs in  reach at end of session.   Therapy Documentation Precautions:  Precautions Precautions: Shoulder, Fall Type of Shoulder Precautions: L UE avoid passive and active shoulder ROM x 2 weeks, OK unrestricted elbow/wrist ROM Shoulder Interventions: Shoulder sling/immobilizer Precaution Booklet Issued: No Precaution Comments: R UE unrestricted ROM, L LE OK for for 0 to 60 degrees knee flexion in hinged brace Required Braces or Orthoses: Sling, Other Brace Other Brace: L LE hinged knee brace Restrictions Weight Bearing Restrictions Per Provider Order: Yes RUE Weight Bearing Per Provider Order: Non weight bearing LUE Weight Bearing Per Provider Order: Non weight bearing LLE Weight Bearing Per Provider Order: Weight bearing as tolerated Other Position/Activity Restrictions: L UE sling and no shoulder ROM, R UE ROM unrestricted; playmakers brace 0-60 L knee    Therapy/Group: Individual Therapy  Melvyn Novas, MS, OTR/L  05/09/2023, 11:27 AM

## 2023-05-09 NOTE — Progress Notes (Addendum)
Physical Therapy Session Note  Patient Details  Name: ZARINAH HURRY MRN: [id number] Date of Birth: Jul 14, 1950  Today's Date: 05/09/2023 PT Individual Time: 1415-1440 PT Individual Time Calculation (min): 25 min   Short Term Goals: Week 1:  PT Short Term Goal 1 (Week 1): Pt will increase bed mobility to min A PT Short Term Goal 2 (Week 1): Pt will increase transfers to min A. PT Short Term Goal 3 (Week 1): Pt will ambulate without assistive device about 100 feet with min A PT Short Term Goal 4 (Week 1): Pt will ascend/descend 1 to 2 steps with no rails and min A PT Short Term Goal 5 (Week 1): Pt will propel w/c about 50 feet with B LEs and S  Skilled Therapeutic Interventions/Progress Updates:    Chart reviewed and pt agreeable to therapy. Pt received seated on toilet with c/o pain in LUE not quantified. Session focused on functional transfers and amb to promote safe home access. Pt initiated session with sit to stand at toilet using minA + no AD and CGA + no AD to amb to beside. Pt then completed blocked practice of amb of 235ft using sup + no AD. Pt educated on judging safe distances for amb in community. At end of session, pt was left seated EOB with alarm engaged, nurse call bell and all needs in reach.     Therapy Documentation Precautions:  Precautions Precautions: Shoulder, Fall Type of Shoulder Precautions: L UE avoid passive and active shoulder ROM x 2 weeks, OK unrestricted elbow/wrist ROM Shoulder Interventions: Shoulder sling/immobilizer Precaution Booklet Issued: No Precaution Comments: R UE unrestricted ROM, L LE OK for for 0 to 60 degrees knee flexion in hinged brace Required Braces or Orthoses: Sling, Other Brace Other Brace: L LE hinged knee brace Restrictions Weight Bearing Restrictions Per Provider Order: Yes RUE Weight Bearing Per Provider Order: Non weight bearing LUE Weight Bearing Per Provider Order: Non weight bearing LLE Weight Bearing Per Provider Order:  Weight bearing as tolerated Other Position/Activity Restrictions: L UE sling and no shoulder ROM, R UE ROM unrestricted; playmakers brace 0-60 L knee General:       Therapy/Group: Individual Therapy  Dionne Milo, PT, DPT 05/09/2023, 2:47 PM

## 2023-05-09 NOTE — Progress Notes (Addendum)
Physical Therapy Session Note  Patient Details  Name: Morgan Potter MRN: [id number] Date of Birth: 1951/02/24  Today's Date: 05/09/2023 PT Individual Time: 1052-1119 PT Individual Time Calculation (min): 27 min   Short Term Goals: Week 1:  PT Short Term Goal 1 (Week 1): Pt will increase bed mobility to min A PT Short Term Goal 2 (Week 1): Pt will increase transfers to min A. PT Short Term Goal 3 (Week 1): Pt will ambulate without assistive device about 100 feet with min A PT Short Term Goal 4 (Week 1): Pt will ascend/descend 1 to 2 steps with no rails and min A PT Short Term Goal 5 (Week 1): Pt will propel w/c about 50 feet with B LEs and S  Skilled Therapeutic Interventions/Progress Updates: Patient supine in bed asleep on entrance to room. Patient easily awakened and alert and agreeable to PT session.   Patient reported 7/10 pain in knee, and 5/10 pain on R knee (pt reported it was tolerable). Pt performed sit to stand from EOB (elevated) with supervision. Pt required VC to increase anterior weight shift due to not being able to use B UE's to push off surface. Pt ambulated from room<>main gym with CGA/close supervision for safety and no AD. Pt on NuStep for 10 min on level 6 resistance and cues to maintain spm between 40-45 (430 steps) with no rest break. Pt stated that she doesn't understand why she can't "just rest" while healing. Pt required mod/heavy modA to stand from nustep level height with VC for anterior weight shift. Pt educated on maintaining mobility, increasing strength/cardiovascular flow will assist in the healing process, and encouraged pt that rehabilitation is all in benefit to assist throughout this time (pt seemingly understood).  Patient supine in bed at end of session with brakes locked, bed alarm set, and all needs within reach.      Therapy Documentation Precautions:  Precautions Precautions: Shoulder, Fall Type of Shoulder Precautions: L UE avoid passive and  active shoulder ROM x 2 weeks, OK unrestricted elbow/wrist ROM Shoulder Interventions: Shoulder sling/immobilizer Precaution Booklet Issued: No Precaution Comments: R UE unrestricted ROM, L LE OK for for 0 to 60 degrees knee flexion in hinged brace Required Braces or Orthoses: Sling, Other Brace Other Brace: L LE hinged knee brace Restrictions Weight Bearing Restrictions Per Provider Order: Yes RUE Weight Bearing Per Provider Order: Non weight bearing LUE Weight Bearing Per Provider Order: Non weight bearing LLE Weight Bearing Per Provider Order: Weight bearing as tolerated Other Position/Activity Restrictions: L UE sling and no shoulder ROM, R UE ROM unrestricted; playmakers brace 0-60 L knee   Therapy/Group: Individual Therapy  Kensley Valladares PTA 05/09/2023, 12:49 PM

## 2023-05-09 NOTE — Progress Notes (Signed)
Occupational Therapy Session Note  Patient Details  Name: Morgan Potter MRN: [id number] Date of Birth: 02/18/1951  Today's Date: 05/09/2023 OT Individual Time: 6578-4696 OT Individual Time Calculation (min): 44 min     Skilled Therapeutic Interventions/Progress Updates: Patient received siting on EOB. Agreeable and motivated to work with OT focusing on PROM of the RUE. Patient educated on pendulum exercises and safe performance with LUE unable to provide stability/balance. Patient in agreement that at this time she needs CGA for forward lean and wt shifts during exercise. Continued RUE ROM in supine. Patient able to transition from EOB to supine without physical assist. Encouraged to perform scapular ROM  when resting in bed. With support at the right wrist and elbow patient taken through AAROM/PROM into shoulder flexion slowing increasing to 90 degrees flexion without c/o pain or discormfort and only mild stiffness towards the end range. Continued with abduction to 45 degreed tolerated well. Educated patient on the importance of distal support and ensuring motion is as pain free as possible with out a "catch" feeling during ROM. Patient motivated to follow NWB and proper ROM to ensure good healing. Educated on tendon glides to perform with left hand while in the sling to promote good circulation and reduce edema. Continue with skilled treatment to regain function of  BUE's.     Therapy Documentation Precautions:  Precautions Precautions: Shoulder, Fall Type of Shoulder Precautions: L UE avoid passive and active shoulder ROM x 2 weeks, OK unrestricted elbow/wrist ROM Shoulder Interventions: Shoulder sling/immobilizer Precaution Booklet Issued: No Precaution Comments: R UE unrestricted ROM, L LE OK for for 0 to 60 degrees knee flexion in hinged brace Required Braces or Orthoses: Sling, Other Brace Other Brace: L LE hinged knee brace Restrictions Weight Bearing Restrictions Per Provider Order:  Yes RUE Weight Bearing Per Provider Order: Non weight bearing LUE Weight Bearing Per Provider Order: Non weight bearing LLE Weight Bearing Per Provider Order: Weight bearing as tolerated Other Position/Activity Restrictions: L UE sling and no shoulder ROM, R UE ROM unrestricted; playmakers brace 0-60 L knee General:   Vital Signs: Therapy Vitals Temp: 98.3 F (36.8 C) Temp Source: Oral Pulse Rate: 95 Resp: 18 BP: (!) 130/56 Patient Position (if appropriate): Lying Oxygen Therapy SpO2: 99 % O2 Device: Room Air Pain: Patient reported UE soreness, but no acute pain or change during PROM of the RUE.        Therapy/Group: Individual Therapy  Warnell Forester 05/09/2023, 3:34 PM

## 2023-05-09 NOTE — Progress Notes (Signed)
PROGRESS NOTE   Subjective/Complaints:  Pt doing well, slept ok, pain well controlled, LBM today, urinating fine. Denies any other complaints or concerns today.    ROS: Patient denies fever, rash, sore throat, blurred vision, dizziness, nausea, vomiting, diarrhea, cough, shortness of breath or chest pain,  headache, or mood change. +bilateral quadriceps weakness-chronic per pt    Objective:   No results found.  No results for input(s): "WBC", "HGB", "HCT", "PLT" in the last 72 hours.  No results for input(s): "NA", "K", "CL", "CO2", "GLUCOSE", "BUN", "CREATININE", "CALCIUM" in the last 72 hours.   Intake/Output Summary (Last 24 hours) at 05/09/2023 1113 Last data filed at 05/09/2023 0737 Gross per 24 hour  Intake 720 ml  Output --  Net 720 ml        Physical Exam: Vital Signs Blood pressure (!) 151/76, pulse 76, temperature 98.7 F (37.1 C), temperature source Oral, resp. rate 20, height 5\' 9"  (1.753 m), weight 100.1 kg, SpO2 97%.  Constitutional: No distress . Vital signs reviewed. Resting in bed HEENT: NCAT, EOMI, oral membranes moist Neck: supple Cardiovascular: RRR without murmur. No JVD    Respiratory/Chest: CTA Bilaterally without wheezes or rales. Normal effort    GI/Abdomen: BS +, non-tender, non-distended, soft Ext: no clubbing, cyanosis, or edema. LUE in sling Psych: pleasant and cooperative   PRIOR EXAMS: Skin:    General: Skin is warm and dry.     Comments:   bruising on B/L Ue's Dressing R bicep- down entire bicep- wound C/D/I KI on LLE-   Neurological:     Mental Status: She is alert.     Comments: Patient is alert no acute distress oriented x 3 and follows commands. Intact to light touch in all 4 extremities  Musc: left arm in sling. Musculoskeletal:     Cervical back: Neck supple. No tenderness.     Comments: Cannot test deltoid or biceps B/L- due to humeral fx's or triceps WE/Grip and  FA 5-/5 B/L  LE's- HF 4/5; KE on R 4+/5; NT on L due to KI; DF/PF 5/5 B/L Wearing sling on LUE- because not allowed to move L shoulder at all- and KI on LLE, stable 12/20   Assessment/Plan: 1. Functional deficits which require 3+ hours per day of interdisciplinary therapy in a comprehensive inpatient rehab setting. Physiatrist is providing close team supervision and 24 hour management of active medical problems listed below. Physiatrist and rehab team continue to assess barriers to discharge/monitor patient progress toward functional and medical goals  Care Tool:  Bathing    Body parts bathed by patient: Left arm, Chest, Abdomen, Face   Body parts bathed by helper: Right arm, Front perineal area, Buttocks, Right upper leg, Left upper leg, Left lower leg, Right lower leg     Bathing assist Assist Level: Maximal Assistance - Patient 24 - 49%     Upper Body Dressing/Undressing Upper body dressing   What is the patient wearing?: Hospital gown only    Upper body assist Assist Level: Moderate Assistance - Patient 50 - 74%    Lower Body Dressing/Undressing Lower body dressing      What is the patient wearing?: Pants  Lower body assist Assist for lower body dressing: Moderate Assistance - Patient 50 - 74%     Toileting Toileting    Toileting assist Assist for toileting: Dependent - Patient 0%     Transfers Chair/bed transfer  Transfers assist     Chair/bed transfer assist level: Supervision/Verbal cueing     Locomotion Ambulation   Ambulation assist      Assist level: Supervision/Verbal cueing Assistive device: No Device Max distance: 300   Walk 10 feet activity   Assist     Assist level: Supervision/Verbal cueing Assistive device: No Device   Walk 50 feet activity   Assist    Assist level: Supervision/Verbal cueing Assistive device: No Device    Walk 150 feet activity   Assist Walk 150 feet activity did not occur: Safety/medical  concerns  Assist level: Supervision/Verbal cueing Assistive device: No Device    Walk 10 feet on uneven surface  activity   Assist Walk 10 feet on uneven surfaces activity did not occur: Safety/medical concerns   Assist level: Contact Guard/Touching assist     Wheelchair     Assist Is the patient using a wheelchair?: No Type of Wheelchair: Manual    Wheelchair assist level: Dependent - Patient 0% Max wheelchair distance: 150    Wheelchair 50 feet with 2 turns activity    Assist        Assist Level: Dependent - Patient 0%   Wheelchair 150 feet activity     Assist      Assist Level: Dependent - Patient 0%   Blood pressure (!) 151/76, pulse 76, temperature 98.7 F (37.1 C), temperature source Oral, resp. rate 20, height 5\' 9"  (1.753 m), weight 100.1 kg, SpO2 97%.    Medical Problem List and Plan: 1. Functional deficits secondary to polytrauma after fall 2012 12/06/2022 -patient may  shower- cover KI and her incisions and L sling- if can take shower, then able to             -ELOS/Goals: 14-16 days- min A --Continue CIR therapies including PT, OT  -NWB B/L UE's- Sling in LUE with no Shoulder ROM for 2 weeks at all -RUE- ROM as tolerated -KI with WBAT on LLE 12/17 reassured pt that we will work through restrictions/precautions. She's anxious as she was having more difficulty moving/transferring before she fell. -decreased to 15/7 2.  Antithrombotics: -DVT/anticoagulation:  Pharmaceutical: Lovenox 40mg  daily check vascular study             -antiplatelet therapy: N/A 3. Pain Management: d/c hydrocodone given pruritus. Advil 400 mg scheduled TID  5. Neuropsych/cognition: This patient is capable of making decisions on her own behalf. 6. Skin/Wound Care: Routine skin checks. R eyebrow sutured 12/7, likely ready to come out 12/17 or sooner 7. Fluids/Electrolytes/Nutrition: Routine in and outs with follow-up chemistries, continue vitamins 8.  Fracture  involving the distal diaphysis of the right humerus.  Status post ORIF of humeral shaft fracture 04/27/2023.  Nonweightbearing, ROM as tolerated 9.  Comminuted mildly displaced fracture of the left humeral head.  Nonoperative.  Nonweightbearing.  Shoulder sling at all times, no shoulder ROM for 2wks. 10.  Nondisplaced transverse fracture left patella.  Weightbearing as tolerated..  Knee immobilizer at all times 11.  Acute blood loss anemia.  Follow-up CBC.  Continue iron supplement 12.  Hypertension.  HCTZ 12.5 mg daily, Cozaar 50 mg daily.  Monitor with increased mobility -discussed the fact that her hyzaar is a combo med with hydrochlorothiazide, increase magnesium to  500mg  HS  -05/09/23 BP a little variable, monitor for now Vitals:   05/05/23 1424 05/05/23 1941 05/06/23 0457 05/06/23 1411  BP: 119/75 (!) 151/68 (!) 146/70 125/68   05/06/23 1940 05/07/23 0543 05/07/23 1625 05/07/23 2026  BP: 127/64 (!) 154/83 (!) 140/65 129/72   05/08/23 0537 05/08/23 1715 05/08/23 2022 05/09/23 0524  BP: (!) 148/73 (!) 153/72 135/70 (!) 151/76    13.  Constipation.  Colace 100 mg twice daily, d/c miralax. continue Senokot S 2 tabs nightly. D/c suppositorty. Milk of mag ordered 12/20  -05/09/23 LBM today, monitor            14.  Hyperlipidemia.  Continue Pravachol 20mg  daily  15.  GERD.  continue Protonix 40mg  daily. Add magnesium supplement HS as can be depleted by Protonix.   16. Pruritus: benadryl prn ordered, continue  17. Anxiety: continue wellbutrin 300mg  daily and scheduled Ativan 1mg  BID, lexapro 20mg  daily  LOS: 7 days A FACE TO FACE EVALUATION WAS PERFORMED  [address] 05/09/2023, 11:13 AM

## 2023-05-10 DIAGNOSIS — T07XXXA Unspecified multiple injuries, initial encounter: Secondary | ICD-10-CM | POA: Diagnosis not present

## 2023-05-10 DIAGNOSIS — K5901 Slow transit constipation: Secondary | ICD-10-CM | POA: Diagnosis not present

## 2023-05-10 DIAGNOSIS — I10 Essential (primary) hypertension: Secondary | ICD-10-CM | POA: Diagnosis not present

## 2023-05-10 NOTE — Progress Notes (Signed)
Physical Therapy Session Note  Patient Details  Name: DENILLE KATZENSTEIN MRN: [id number] Date of Birth: 12-24-1950  Today's Date: 05/10/2023 PT Individual Time: 1000-1055 PT Individual Time Calculation (min): 55 min   Short Term Goals: Week 1:  PT Short Term Goal 1 (Week 1): Pt will increase bed mobility to min A PT Short Term Goal 2 (Week 1): Pt will increase transfers to min A. PT Short Term Goal 3 (Week 1): Pt will ambulate without assistive device about 100 feet with min A PT Short Term Goal 4 (Week 1): Pt will ascend/descend 1 to 2 steps with no rails and min A PT Short Term Goal 5 (Week 1): Pt will propel w/c about 50 feet with B LEs and S  Skilled Therapeutic Interventions/Progress Updates:    Chart reviewed and pt agreeable to therapy. Pt received semi-reclined in bed with 5/10 c/o pain in L shoulder with activity. Session focused on functional transfers, amb, and stair navigation to promote safe home access. Pt initiated session independent transfer to EOB and close sup for sit to stand with elevated seat. Pt then completed amb of 282ft to therapy gym using CGA + no AD fading to close sup. Pt then completed 12 steps with no rails and sup assist. Pt demonstrated safe stepping pattern. Pt then discussed family support after d/c, including daughter's support at supervision level. PT and pt discussed plans for daughter to attend session tomorrow if possible to learn about guarding and supervising pt mobility, particularly at steps and car transfers. Pt then amb to second therapy gym at same assist level. In gym, pt completed blocked practice of car transfer with 8inch step and step backwards technique. Pt able to complete transfer at [facility] level fading to close sup. Pt then returned to room via amb >350ft using close sup + no AD. Session education emphasized importance of continued therapy after d/c. At end of session, pt was left semi-reclined in bed with alarm engaged, nurse call bell and all needs  in reach.     Therapy Documentation Precautions:  Precautions Precautions: Shoulder, Fall Type of Shoulder Precautions: L UE avoid passive and active shoulder ROM x 2 weeks, OK unrestricted elbow/wrist ROM Shoulder Interventions: Shoulder sling/immobilizer Precaution Booklet Issued: No Precaution Comments: R UE unrestricted ROM, L LE OK for for 0 to 60 degrees knee flexion in hinged brace Required Braces or Orthoses: Sling, Other Brace Other Brace: L LE hinged knee brace Restrictions Weight Bearing Restrictions Per Provider Order: Yes RUE Weight Bearing Per Provider Order: Non weight bearing LUE Weight Bearing Per Provider Order: Weight bearing as tolerated LLE Weight Bearing Per Provider Order: Weight bearing as tolerated Other Position/Activity Restrictions: L UE sling and no shoulder ROM, R UE ROM unrestricted; playmakers brace 0-60 L knee General:     Therapy/Group: Individual Therapy  Dionne Milo, PT, DPT 05/10/2023, 12:12 PM

## 2023-05-10 NOTE — Progress Notes (Addendum)
Occupational Therapy Session Note  Patient Details  Name: Morgan Potter MRN: [id number] Date of Birth: November 18, 1950  Today's Date: 05/10/2023 OT Individual Time: 4098-1191 OT Individual Time Calculation (min): 73 min    Short Term Goals: Week 2:  OT Short Term Goal 1 (Week 2): STG= LTG due to LOS  Skilled Therapeutic Interventions/Progress Updates:    Patient received supine in bed, stating - "I thought you were coming at 2."  Patient agreeable to OT session and eager for shower.  Indicates need to void and ambulated to bathroom and completing clothing management with supervision.  Patient able to remove pants and brace without assistance - needs encouragement to try new things.  Patient needs assistance to unhook sling to remove for shower, able to doff pull over shirt while maintaining LUE in dependent position.  Showering with assist only for L axilla, and back.  Encouraged patient to use a long handled bath brush to improve independence at home.  Hope to provide education for daughter tomorrow.  Discussed with patient need for daughter to assist with first showers until routine established.  Patient is now at the point she could feasibly get to/from bathroom and complete toileting without assistance.  Reviewed equipment and methods for increased independence and safety.  Reinforced that patient is standing now from surfaces lower than 23" although needs cues for sufficiently forward lean.  Patient returned to bed at end of session.  Bed alarm engaged and call bell/ personal items in reach.   Therapy Documentation Precautions:  Precautions Precautions: Shoulder, Fall Type of Shoulder Precautions: L UE avoid passive and active shoulder ROM x 2 weeks, OK unrestricted elbow/wrist ROM Shoulder Interventions: Shoulder sling/immobilizer Precaution Booklet Issued: No Precaution Comments: R UE unrestricted ROM, L LE OK for for 0 to 60 degrees knee flexion in hinged brace Required Braces or Orthoses:  Sling, Other Brace Other Brace: L LE hinged knee brace Restrictions Weight Bearing Restrictions Per Provider Order: Yes RUE Weight Bearing Per Provider Order: Non weight bearing LUE Weight Bearing Per Provider Order: Weight bearing as tolerated LLE Weight Bearing Per Provider Order: Weight bearing as tolerated Other Position/Activity Restrictions: L UE sling and no shoulder ROM, R UE ROM unrestricted; playmakers brace 0-60 L knee   Pain:      Therapy/Group: Individual Therapy  Collier Salina 05/10/2023, 3:38 PM

## 2023-05-10 NOTE — Progress Notes (Signed)
PROGRESS NOTE   Subjective/Complaints:  Pt doing well again today, but slept poorly because she was too cold-- will ask for a second blanket tonight. Pain well controlled, LBM this morning, urinating fine. Denies any other complaints or concerns today. Nervous to go home soon.    ROS: Patient denies fever, rash, sore throat, blurred vision, dizziness, nausea, vomiting, diarrhea, cough, shortness of breath or chest pain,  headache, or mood change. +bilateral quadriceps weakness-chronic per pt    Objective:   No results found.  No results for input(s): "WBC", "HGB", "HCT", "PLT" in the last 72 hours.  No results for input(s): "NA", "K", "CL", "CO2", "GLUCOSE", "BUN", "CREATININE", "CALCIUM" in the last 72 hours.   Intake/Output Summary (Last 24 hours) at 05/10/2023 1120 Last data filed at 05/10/2023 0753 Gross per 24 hour  Intake 720 ml  Output 525 ml  Net 195 ml        Physical Exam: Vital Signs Blood pressure (!) 151/73, pulse 91, temperature 99.2 F (37.3 C), temperature source Oral, resp. rate 20, height 5\' 9"  (1.753 m), weight 100.1 kg, SpO2 97%.  Constitutional: No distress . Vital signs reviewed. Resting in bed HEENT: NCAT, EOMI, oral membranes moist Neck: supple Cardiovascular: RRR without murmur. No JVD    Respiratory/Chest: CTA Bilaterally without wheezes or rales. Normal effort    GI/Abdomen: BS +, non-tender, non-distended, soft Ext: no clubbing, cyanosis, or edema. LUE in sling Psych: pleasant and cooperative, a little anxious  PRIOR EXAMS: Skin:    General: Skin is warm and dry.     Comments:   bruising on B/L Ue's Dressing R bicep- down entire bicep- wound C/D/I KI on LLE-   Neurological:     Mental Status: She is alert.     Comments: Patient is alert no acute distress oriented x 3 and follows commands. Intact to light touch in all 4 extremities  Musc: left arm in sling. Musculoskeletal:      Cervical back: Neck supple. No tenderness.     Comments: Cannot test deltoid or biceps B/L- due to humeral fx's or triceps WE/Grip and FA 5-/5 B/L  LE's- HF 4/5; KE on R 4+/5; NT on L due to KI; DF/PF 5/5 B/L Wearing sling on LUE- because not allowed to move L shoulder at all- and KI on LLE, stable 12/20   Assessment/Plan: 1. Functional deficits which require 3+ hours per day of interdisciplinary therapy in a comprehensive inpatient rehab setting. Physiatrist is providing close team supervision and 24 hour management of active medical problems listed below. Physiatrist and rehab team continue to assess barriers to discharge/monitor patient progress toward functional and medical goals  Care Tool:  Bathing    Body parts bathed by patient: Left arm, Chest, Abdomen, Face   Body parts bathed by helper: Right arm, Front perineal area, Buttocks, Right upper leg, Left upper leg, Left lower leg, Right lower leg     Bathing assist Assist Level: Maximal Assistance - Patient 24 - 49%     Upper Body Dressing/Undressing Upper body dressing   What is the patient wearing?: Hospital gown only    Upper body assist Assist Level: Moderate Assistance - Patient 50 -  74%    Lower Body Dressing/Undressing Lower body dressing      What is the patient wearing?: Pants     Lower body assist Assist for lower body dressing: Moderate Assistance - Patient 50 - 74%     Toileting Toileting    Toileting assist Assist for toileting: Dependent - Patient 0%     Transfers Chair/bed transfer  Transfers assist     Chair/bed transfer assist level: Supervision/Verbal cueing     Locomotion Ambulation   Ambulation assist      Assist level: Supervision/Verbal cueing Assistive device: No Device Max distance: 300   Walk 10 feet activity   Assist     Assist level: Supervision/Verbal cueing Assistive device: No Device   Walk 50 feet activity   Assist    Assist level: Supervision/Verbal  cueing Assistive device: No Device    Walk 150 feet activity   Assist Walk 150 feet activity did not occur: Safety/medical concerns  Assist level: Supervision/Verbal cueing Assistive device: No Device    Walk 10 feet on uneven surface  activity   Assist Walk 10 feet on uneven surfaces activity did not occur: Safety/medical concerns   Assist level: Contact Guard/Touching assist     Wheelchair     Assist Is the patient using a wheelchair?: No Type of Wheelchair: Manual    Wheelchair assist level: Dependent - Patient 0% Max wheelchair distance: 150    Wheelchair 50 feet with 2 turns activity    Assist        Assist Level: Dependent - Patient 0%   Wheelchair 150 feet activity     Assist      Assist Level: Dependent - Patient 0%   Blood pressure (!) 151/73, pulse 91, temperature 99.2 F (37.3 C), temperature source Oral, resp. rate 20, height 5\' 9"  (1.753 m), weight 100.1 kg, SpO2 97%.    Medical Problem List and Plan: 1. Functional deficits secondary to polytrauma after fall 2012 12/06/2022 -patient may  shower- cover KI and her incisions and L sling- if can take shower, then able to             -ELOS/Goals: 14-16 days- min A --Continue CIR therapies including PT, OT  -NWB B/L UE's- Sling in LUE with no Shoulder ROM for 2 weeks at all -RUE- ROM as tolerated -KI with WBAT on LLE 12/17 reassured pt that we will work through restrictions/precautions. She's anxious as she was having more difficulty moving/transferring before she fell. -decreased to 15/7 2.  Antithrombotics: -DVT/anticoagulation:  Pharmaceutical: Lovenox 40mg  daily check vascular study             -antiplatelet therapy: N/A 3. Pain Management: d/c hydrocodone given pruritus. Advil 400 mg scheduled TID  5. Neuropsych/cognition: This patient is capable of making decisions on her own behalf. 6. Skin/Wound Care: Routine skin checks. R eyebrow sutured 12/7, likely ready to come out 12/17  or sooner 7. Fluids/Electrolytes/Nutrition: Routine in and outs with follow-up chemistries, continue vitamins 8.  Fracture involving the distal diaphysis of the right humerus.  Status post ORIF of humeral shaft fracture 04/27/2023.  Nonweightbearing, ROM as tolerated 9.  Comminuted mildly displaced fracture of the left humeral head.  Nonoperative.  Nonweightbearing.  Shoulder sling at all times, no shoulder ROM for 2wks. 10.  Nondisplaced transverse fracture left patella.  Weightbearing as tolerated..  Knee immobilizer at all times 11.  Acute blood loss anemia.  Follow-up CBC.  Continue iron supplement 12.  Hypertension.  HCTZ 12.5 mg  daily, Cozaar 50 mg daily.  Monitor with increased mobility -discussed the fact that her hyzaar is a combo med with hydrochlorothiazide, increase magnesium to 500mg  HS  -12/21-22/24 BP a little variable, monitor for now Vitals:   05/06/23 1411 05/06/23 1940 05/07/23 0543 05/07/23 1625  BP: 125/68 127/64 (!) 154/83 (!) 140/65   05/07/23 2026 05/08/23 0537 05/08/23 1715 05/08/23 2022  BP: 129/72 (!) 148/73 (!) 153/72 135/70   05/09/23 0524 05/09/23 1336 05/09/23 1937 05/10/23 0709  BP: (!) 151/76 (!) 130/56 116/66 (!) 151/73    13.  Constipation.  Colace 100 mg twice daily, d/c miralax. continue Senokot S 2 tabs nightly. D/c suppositorty. Milk of mag ordered 12/20  -05/10/23 LBM today, monitor            14.  Hyperlipidemia.  Continue Pravachol 20mg  daily  15.  GERD.  continue Protonix 40mg  daily. Add magnesium supplement HS as can be depleted by Protonix.   16. Pruritus: benadryl prn ordered, continue  17. Anxiety: continue wellbutrin 300mg  daily and scheduled Ativan 1mg  BID, lexapro 20mg  daily  LOS: 8 days A FACE TO FACE EVALUATION WAS PERFORMED  [address] 05/10/2023, 11:20 AM

## 2023-05-11 ENCOUNTER — Other Ambulatory Visit (HOSPITAL_COMMUNITY): Payer: Self-pay

## 2023-05-11 DIAGNOSIS — T07XXXA Unspecified multiple injuries, initial encounter: Secondary | ICD-10-CM | POA: Diagnosis not present

## 2023-05-11 LAB — CBC
HCT: 31.5 % — ABNORMAL LOW (ref 36.0–46.0)
Hemoglobin: 10.2 g/dL — ABNORMAL LOW (ref 12.0–15.0)
MCH: 30.1 pg (ref 26.0–34.0)
MCHC: 32.4 g/dL (ref 30.0–36.0)
MCV: 92.9 fL (ref 80.0–100.0)
Platelets: 553 10*3/uL — ABNORMAL HIGH (ref 150–400)
RBC: 3.39 MIL/uL — ABNORMAL LOW (ref 3.87–5.11)
RDW: 15.7 % — ABNORMAL HIGH (ref 11.5–15.5)
WBC: 6 10*3/uL (ref 4.0–10.5)
nRBC: 0 % (ref 0.0–0.2)

## 2023-05-11 LAB — BASIC METABOLIC PANEL
Anion gap: 11 (ref 5–15)
BUN: 17 mg/dL (ref 8–23)
CO2: 22 mmol/L (ref 22–32)
Calcium: 9.1 mg/dL (ref 8.9–10.3)
Chloride: 106 mmol/L (ref 98–111)
Creatinine, Ser: 0.97 mg/dL (ref 0.44–1.00)
GFR, Estimated: >60 mL/min (ref >60)
Glucose, Bld: 108 mg/dL — ABNORMAL HIGH (ref 70–99)
Potassium: 3.9 mmol/L (ref 3.5–5.1)
Sodium: 139 mmol/L (ref 135–145)

## 2023-05-11 MED ORDER — CHOLECALCIFEROL 125 MCG (5000 UT) PO TABS
5000.0000 [IU] | ORAL_TABLET | Freq: Every day | ORAL | 0 refills | Status: DC
  Filled 2023-05-11: qty 30, 30d supply, fill #0

## 2023-05-11 MED ORDER — MONTELUKAST SODIUM 10 MG PO TABS
10.0000 mg | ORAL_TABLET | Freq: Every day | ORAL | 0 refills | Status: DC
  Filled 2023-05-11: qty 30, 30d supply, fill #0

## 2023-05-11 MED ORDER — ESOMEPRAZOLE MAGNESIUM 40 MG PO CPDR
40.0000 mg | DELAYED_RELEASE_CAPSULE | Freq: Every day | ORAL | 0 refills | Status: AC
  Filled 2023-05-11: qty 30, 30d supply, fill #0

## 2023-05-11 MED ORDER — BUPROPION HCL ER (XL) 300 MG PO TB24
300.0000 mg | ORAL_TABLET | Freq: Every day | ORAL | 0 refills | Status: AC
  Filled 2023-05-11: qty 30, 30d supply, fill #0

## 2023-05-11 MED ORDER — ESCITALOPRAM OXALATE 20 MG PO TABS
20.0000 mg | ORAL_TABLET | Freq: Every day | ORAL | 0 refills | Status: AC
  Filled 2023-05-11: qty 30, 30d supply, fill #0

## 2023-05-11 MED ORDER — B COMPLEX-C PO TABS
1.0000 | ORAL_TABLET | Freq: Every day | ORAL | 0 refills | Status: AC
  Filled 2023-05-11: qty 30, 30d supply, fill #0

## 2023-05-11 MED ORDER — APIXABAN 2.5 MG PO TABS
ORAL_TABLET | ORAL | 0 refills | Status: DC
  Filled 2023-05-11: qty 34, 17d supply, fill #0

## 2023-05-11 MED ORDER — LORAZEPAM 1 MG PO TABS
1.0000 mg | ORAL_TABLET | Freq: Two times a day (BID) | ORAL | 0 refills | Status: AC
  Filled 2023-05-11: qty 30, 15d supply, fill #0

## 2023-05-11 MED ORDER — METHOCARBAMOL 500 MG PO TABS
500.0000 mg | ORAL_TABLET | Freq: Four times a day (QID) | ORAL | 0 refills | Status: DC | PRN
  Filled 2023-05-11: qty 60, 15d supply, fill #0

## 2023-05-11 MED ORDER — PRAVASTATIN SODIUM 20 MG PO TABS
20.0000 mg | ORAL_TABLET | Freq: Every evening | ORAL | 0 refills | Status: DC
  Filled 2023-05-11: qty 30, 30d supply, fill #0

## 2023-05-11 MED ORDER — THIAMINE HCL 100 MG PO TABS
100.0000 mg | ORAL_TABLET | Freq: Every day | ORAL | 0 refills | Status: DC
  Filled 2023-05-11: qty 30, 30d supply, fill #0

## 2023-05-11 MED ORDER — TRAMADOL HCL 50 MG PO TABS
50.0000 mg | ORAL_TABLET | Freq: Four times a day (QID) | ORAL | 0 refills | Status: DC | PRN
  Filled 2023-05-11: qty 25, 7d supply, fill #0

## 2023-05-11 MED ORDER — FERROUS SULFATE 325 (65 FE) MG PO TABS
325.0000 mg | ORAL_TABLET | Freq: Every day | ORAL | 0 refills | Status: DC
  Filled 2023-05-11: qty 30, 30d supply, fill #0

## 2023-05-11 NOTE — Progress Notes (Signed)
Occupational Therapy Discharge Summary  Patient Details  Name: Morgan Potter MRN: [id number] Date of Birth: May 23, 1950  Date of Discharge from OT service:May 11, 2023  Today's Date: 05/11/2023 OT Individual Time: 4742-5956 OT Individual Time Calculation (min): 43 min   Skilled Therapeutic Intervention:  Patient received supine in bed.  Came to edge of bed independently.  Used session to problem solve discharge situation.  Daughter not present for education- coming this afternoon. Patient very tearful and anxious regarding the state of her house upon discharge with multiple animals (cats and dogs) and daughter, four great grandchildren.  Patient able to complete toileting and pericare independently this session.  Left in bed with alarm engaged and call bell/ personal items in reach.    Patient has met 6 of 8 long term goals due to improved activity tolerance, improved balance, ability to compensate for deficits, and functional use of  RIGHT upper extremity.  Patient to discharge at [facility] Assist level.  Patient's care partner unavailable to provide the necessary physical assistance at discharge.  Family is available to assist at home, but not available for education session prior to discharge.  Patient is able to educate daughter in her specific needs.   Reasons goals not met: Patient unable to reach left foot or independently apply left knee brace with the use of one arm.    Recommendation:  Patient will benefit from ongoing skilled OT services in outpatient setting to continue to advance functional skills in the area of BADL and Reduce care partner burden.  Equipment: Recommend wide BSC or high toilet, and shower seat  Reasons for discharge: treatment goals met and discharge from hospital  Patient/family agrees with progress made and goals achieved: Yes  OT Discharge Precautions/Restrictions  Precautions Precautions: Shoulder;Fall Type of Shoulder Precautions: L UE avoid  passive and active shoulder ROM x 2 weeks, OK unrestricted elbow/wrist ROM Shoulder Interventions: Shoulder sling/immobilizer Precaution Comments: R UE unrestricted ROM, L LE OK for for 0 to 60 degrees knee flexion in hinged brace Required Braces or Orthoses: Sling;Other Brace Other Brace: L LE hinged knee brace Restrictions Weight Bearing Restrictions Per Provider Order: Yes RUE Weight Bearing Per Provider Order: Non weight bearing LUE Weight Bearing Per Provider Order: Non weight bearing LLE Weight Bearing Per Provider Order: Weight bearing as tolerated  Pain Pain Assessment Pain Score: 0-No pain ADL ADL Equipment Provided:  (none) Eating: Set up Where Assessed-Eating:  (based on observation) Grooming: Modified independent Where Assessed-Grooming: Sitting at sink, Standing at sink Upper Body Bathing: Minimal assistance Where Assessed-Upper Body Bathing: Shower Lower Body Bathing: Minimal assistance Where Assessed-Lower Body Bathing: Shower Upper Body Dressing: Supervision/safety Where Assessed-Upper Body Dressing: Edge of bed Lower Body Dressing: Minimal assistance Where Assessed-Lower Body Dressing: Edge of bed, Sitting at sink Toileting: Modified independent Where Assessed-Toileting: Teacher, adult education: Engineer, agricultural Method: Proofreader: Gaffer: Modified independent Web designer Method: Ship broker: Insurance underwriter: Modified independent Film/video editor Method: Designer, industrial/product: Sales promotion account executive Baseline Vision/History: 4 Cataracts;1 Wears glasses Patient Visual Report: No change from baseline Vision Assessment?: No apparent visual deficits Perception  Perception: Within Functional Limits Praxis Praxis: WFL Cognition Cognition Overall Cognitive Status: Within Functional Limits for tasks  assessed Brief Interview for Mental Status (BIMS) Repetition of Three Words (First Attempt): 3 Temporal Orientation: Year: Correct Temporal Orientation: Month: Accurate within 5 days Temporal Orientation: Day: Correct Recall: "Sock": Yes, no cue required  Recall: "Blue": Yes, no cue required Recall: "Bed": Yes, no cue required BIMS Summary Score: 15 Sensation Sensation Light Touch: Impaired by gross assessment Light Touch Impaired Details: Impaired LLE;Impaired RLE Hot/Cold: Appears Intact Proprioception: Appears Intact Stereognosis: Not tested Coordination Gross Motor Movements are Fluid and Coordinated: No Fine Motor Movements are Fluid and Coordinated: No Motor  Motor Motor: Within Functional Limits Mobility  Bed Mobility Bed Mobility: Rolling Right;Right Sidelying to Sit Rolling Right: Independent Right Sidelying to Sit: Independent Supine to Sit: Independent with assistive device Sit to Supine: Independent with assistive device Transfers Sit to Stand: Independent with assistive device Stand to Sit: Independent with assistive device  Trunk/Postural Assessment  Thoracic Assessment Thoracic Assessment: Within Functional Limits Lumbar Assessment Lumbar Assessment: Within Functional Limits Postural Control Postural Control: Within Functional Limits  Balance Balance Balance Assessed: Yes Static Sitting Balance Static Sitting - Balance Support: Feet supported Static Sitting - Level of Assistance: 7: Independent Static Standing Balance Static Standing - Balance Support: During functional activity;No upper extremity supported Static Standing - Level of Assistance: 7: Independent Dynamic Standing Balance Dynamic Standing - Balance Support: During functional activity;No upper extremity supported Dynamic Standing - Level of Assistance: 6: Modified independent (Device/Increase time) Extremity/Trunk Assessment RUE Assessment RUE Assessment: Exceptions to [facility] Active Range  of Motion (AROM) Comments: ~ 75* Shoulder flexon seated, 120* supine General Strength Comments: NT RUE Body System: Ortho LUE Assessment LUE Assessment: Exceptions to [facility] Active Range of Motion (AROM) Comments: No movement allowed left shoulder elbow/forearm/wrist [facility] General Strength Comments: NT LUE Body System: Conni Slipper 05/11/2023, 12:45 PM

## 2023-05-11 NOTE — Progress Notes (Signed)
Inpatient Rehabilitation Care Coordinator Discharge Note   Patient Details  Name: Morgan Potter MRN: [id number] Date of Birth: 1950/12/16   Discharge location: D/C home with assistance from daughter  Length of Stay: 9 Days  Discharge activity level: CGA  Home/community participation: Limited  Patient response MW:UXLKGM Literacy - How often do you need to have someone help you when you read instructions, pamphlets, or other written material from your doctor or pharmacy?: Never  Patient response WN:UUVOZD Isolation - How often do you feel lonely or isolated from those around you?: Never  Services provided included: RD, PT, OT, MD, SLP, CM, RN, TR, Pharmacy, Neuropsych, SW  Financial Services:  Field seismologist Utilized: Private Insurance [employer]  Choices offered to/list presented to: Patient  Follow-up services arranged:  Home Health, DME Home Health Agency: [facility]    DME : Purchading BSC privately    Patient response to transportation need: Is the patient able to respond to transportation needs?: Yes In the past 12 months, has lack of transportation kept you from medical appointments or from getting medications?: No In the past 12 months, has lack of transportation kept you from meetings, work, or from getting things needed for daily living?: No   Patient/Family verbalized understanding of follow-up arrangements:  Yes  Individual responsible for coordination of the follow-up plan: patient [phone]  Confirmed correct DME delivered: Andria Rhein 05/11/2023    Comments (or additional information):  Summary of Stay    Date/Time Discharge Planning CSW  05/06/23 0841 Home with daughter assisting aware recommend 24/7 daughter also guaridan of her four grandchildren-4 yo-16 yo. Pain and WB issues barriers to progress. RGD       Andria Rhein

## 2023-05-11 NOTE — Progress Notes (Signed)
PROGRESS NOTE   Subjective/Complaints: No new complaints this morning Discussed er medications on discharge, messaged Dan to please send these to [facility] pharmacy and he already has   ROS: Patient denies fever, rash, sore throat, blurred vision, dizziness, nausea, vomiting, diarrhea, cough, shortness of breath or chest pain,  headache, or mood change. +bilateral quadriceps weakness-chronic per pt, +sutures in right eyebrow    Objective:   No results found.  Recent Labs    05/11/23 0516  WBC 6.0  HGB 10.2*  HCT 31.5*  PLT 553*    Recent Labs    05/11/23 0516  NA 139  K 3.9  CL 106  CO2 22  GLUCOSE 108*  BUN 17  CREATININE 0.97  CALCIUM 9.1     Intake/Output Summary (Last 24 hours) at 05/11/2023 1137 Last data filed at 05/11/2023 0700 Gross per 24 hour  Intake 838 ml  Output 900 ml  Net -62 ml        Physical Exam: Vital Signs Blood pressure (!) 160/80, pulse 77, temperature 97.7 F (36.5 C), temperature source Oral, resp. rate 18, height 5\' 9"  (1.753 m), weight 100.1 kg, SpO2 96%.  Constitutional: No distress . Vital signs reviewed. Resting in bed HEENT: NCAT, EOMI, oral membranes moist Neck: supple Cardiovascular: RRR without murmur. No JVD    Respiratory/Chest: CTA Bilaterally without wheezes or rales. Normal effort    GI/Abdomen: BS +, non-tender, non-distended, soft Ext: no clubbing, cyanosis, or edema. LUE in sling Psych: pleasant and cooperative, a little anxious  Skin:    General: Skin is warm and dry.     Comments:   bruising on B/L Ue's Dressing R bicep- down entire bicep- wound C/D/I KI on LLE-   Neurological:     Mental Status: She is alert.     Comments: Patient is alert no acute distress oriented x 3 and follows commands. Intact to light touch in all 4 extremities  Musc: left arm in sling. Musculoskeletal:     Cervical back: Neck supple. No tenderness.     Comments: Cannot test  deltoid or biceps B/L- due to humeral fx's or triceps WE/Grip and FA 5-/5 B/L  LE's- HF 4/5; KE on R 4+/5; NT on L due to KI; DF/PF 5/5 B/L Wearing sling on LUE- because not allowed to move L shoulder at all- and KI on LLE, stable 12/23   Assessment/Plan: 1. Functional deficits which require 3+ hours per day of interdisciplinary therapy in a comprehensive inpatient rehab setting. Physiatrist is providing close team supervision and 24 hour management of active medical problems listed below. Physiatrist and rehab team continue to assess barriers to discharge/monitor patient progress toward functional and medical goals  Care Tool:  Bathing    Body parts bathed by patient: Left arm, Chest, Abdomen, Face   Body parts bathed by helper: Right arm, Front perineal area, Buttocks, Right upper leg, Left upper leg, Left lower leg, Right lower leg     Bathing assist Assist Level: Maximal Assistance - Patient 24 - 49%     Upper Body Dressing/Undressing Upper body dressing   What is the patient wearing?: Hospital gown only    Upper body assist  Assist Level: Moderate Assistance - Patient 50 - 74%    Lower Body Dressing/Undressing Lower body dressing      What is the patient wearing?: Pants     Lower body assist Assist for lower body dressing: Moderate Assistance - Patient 50 - 74%     Toileting Toileting    Toileting assist Assist for toileting: Dependent - Patient 0%     Transfers Chair/bed transfer  Transfers assist     Chair/bed transfer assist level: Supervision/Verbal cueing     Locomotion Ambulation   Ambulation assist      Assist level: Supervision/Verbal cueing Assistive device: No Device Max distance: 300   Walk 10 feet activity   Assist     Assist level: Supervision/Verbal cueing Assistive device: No Device   Walk 50 feet activity   Assist    Assist level: Supervision/Verbal cueing Assistive device: No Device    Walk 150 feet  activity   Assist Walk 150 feet activity did not occur: Safety/medical concerns  Assist level: Supervision/Verbal cueing Assistive device: No Device    Walk 10 feet on uneven surface  activity   Assist Walk 10 feet on uneven surfaces activity did not occur: Safety/medical concerns   Assist level: Supervision/Verbal cueing     Wheelchair     Assist Is the patient using a wheelchair?: No Type of Wheelchair: Manual Wheelchair activity did not occur: N/A  Wheelchair assist level: Dependent - Patient 0% Max wheelchair distance: 150    Wheelchair 50 feet with 2 turns activity    Assist    Wheelchair 50 feet with 2 turns activity did not occur: N/A   Assist Level: Dependent - Patient 0%   Wheelchair 150 feet activity     Assist  Wheelchair 150 feet activity did not occur: N/A   Assist Level: Dependent - Patient 0%   Blood pressure (!) 160/80, pulse 77, temperature 97.7 F (36.5 C), temperature source Oral, resp. rate 18, height 5\' 9"  (1.753 m), weight 100.1 kg, SpO2 96%.    Medical Problem List and Plan: 1. Functional deficits secondary to polytrauma after fall 2012 12/06/2022 -patient may  shower- cover KI and her incisions and L sling- if can take shower, then able to             -ELOS/Goals: 14-16 days- min A --Continue CIR therapies including PT, OT  -NWB B/L UE's- Sling in LUE with no Shoulder ROM for 2 weeks at all -RUE- ROM as tolerated -KI with WBAT on LLE 12/17 reassured pt that we will work through restrictions/precautions. She's anxious as she was having more difficulty moving/transferring before she fell. -decreased to 15/7 Discussed sending meds to TOC pharamcy 2.  Antithrombotics: D/c lovenox since ambulating 200 feetdaily check vascular study             -antiplatelet therapy: N/A 3. Pain Management: d/c hydrocodone given pruritus. Advil 400 mg scheduled TID  5. Neuropsych/cognition: This patient is capable of making decisions on her  own behalf. 6. Skin/Wound Care: Routine skin checks. R eyebrow sutured 12/7, likely ready to come out 12/17 or sooner 7. Fluids/Electrolytes/Nutrition: Routine in and outs with follow-up chemistries, continue vitamins 8.  Fracture involving the distal diaphysis of the right humerus.  Status post ORIF of humeral shaft fracture 04/27/2023.  Nonweightbearing, ROM as tolerated 9.  Comminuted mildly displaced fracture of the left humeral head.  Nonoperative.  Nonweightbearing.  Shoulder sling at all times, no shoulder ROM for 2wks. 10.  Nondisplaced transverse fracture  left patella.  Weightbearing as tolerated..  Knee immobilizer at all times 11.  Acute blood loss anemia.  Follow-up CBC.  Continue iron supplement 12.  Hypertension.  HCTZ 12.5 mg daily, Cozaar 50 mg daily.  Monitor with increased mobility -discussed the fact that her hyzaar is a combo med with hydrochlorothiazide, increase magnesium to 500mg  HS  -12/21-22/24 BP a little variable, monitor for now Vitals:   05/07/23 1625 05/07/23 2026 05/08/23 0537 05/08/23 1715  BP: (!) 140/65 129/72 (!) 148/73 (!) 153/72   05/08/23 2022 05/09/23 0524 05/09/23 1336 05/09/23 1937  BP: 135/70 (!) 151/76 (!) 130/56 116/66   05/10/23 0709 05/10/23 1634 05/10/23 1938 05/11/23 0519  BP: (!) 151/73 134/70 118/64 (!) 160/80    13.  Constipation.  Colace 100 mg twice daily, d/c miralax. continue Senokot S 2 tabs nightly. D/c suppositorty. Milk of mag ordered 12/20  -05/10/23 LBM today, monitor            14.  Hyperlipidemia.  Continue Pravachol 20mg  daily  15.  GERD.  continue Protonix 40mg  daily. Add magnesium supplement HS as can be depleted by Protonix.   16. Pruritus: benadryl prn ordered, continue  17. Anxiety: continue wellbutrin 300mg  daily and scheduled Ativan 1mg  BID, lexapro 20mg  daily  18. Right eyebrow suture: will consult trauma to please remove suture.   LOS: 9 days A FACE TO FACE EVALUATION WAS PERFORMED  Horton Chin 05/11/2023, 11:37 AM

## 2023-05-11 NOTE — Progress Notes (Signed)
Physical Therapy Discharge Summary  Patient Details  Name: RHAVEN HEITZ MRN: [id number] Date of Birth: [birth year]  Date of Discharge from PT service:May 11, 2023  {CHL IP REHAB PT TIME CALCULATION:304800500}   Patient has met {NUMBERS 0-12:18577} of {NUMBERS 0-12:18577} long term goals due to {due WU:9811914}.  Patient to discharge at [facility] level {LOA:3049010}.   Patient's care partner {care partner:3041650} to provide the necessary {assistance:3041652} assistance at discharge.  Reasons goals not met: ***  Recommendation:  Patient will benefit from ongoing skilled PT services in {setting:3041680} to continue to advance safe functional mobility, address ongoing impairments in ***, and minimize fall risk.  Equipment: BSC   Reasons for discharge: treatment goals met and discharge from hospital  Patient/family agrees with progress made and goals achieved: Yes  PT Discharge Precautions/Restrictions Precautions Precautions: Shoulder;Fall Type of Shoulder Precautions: L UE avoid passive and active shoulder ROM x 2 weeks, OK unrestricted elbow/wrist ROM Shoulder Interventions: Shoulder sling/immobilizer Precaution Comments: R UE unrestricted ROM, L LE OK for for 0 to 60 degrees knee flexion in hinged brace Required Braces or Orthoses: Sling;Other Brace Other Brace: L LE hinged knee brace Restrictions Weight Bearing Restrictions Per Provider Order: Yes RUE Weight Bearing Per Provider Order: Non weight bearing LUE Weight Bearing Per Provider Order: Non weight bearing LLE Weight Bearing Per Provider Order: Weight bearing as tolerated Other Position/Activity Restrictions: L UE sling and no shoulder ROM, R UE ROM unrestricted; playmakers brace 0-60 L knee Pain Interference Pain Interference Pain Effect on Sleep: 1. Rarely or not at all Pain Interference with Therapy Activities: 3. Frequently Pain Interference with Day-to-Day Activities: 2. Occasionally Vision/Perception   Vision - History Ability to See in Adequate Light: 0 Adequate Perception Perception: Within Functional Limits Praxis Praxis: WFL  Cognition Overall Cognitive Status: Within Functional Limits for tasks assessed Arousal/Alertness: Awake/alert Orientation Level: Oriented X4 Memory: Appears intact Awareness: Appears intact Problem Solving: Appears intact Behaviors: Verbal agitation;Poor frustration tolerance;Lability Safety/Judgment: Appears intact Sensation Sensation Light Touch: Impaired by gross assessment Peripheral sensation comments: impaired light touch B feet Light Touch Impaired Details: Impaired LLE;Impaired RLE Hot/Cold: Appears Intact Proprioception: Appears Intact Stereognosis: Not tested Coordination Gross Motor Movements are Fluid and Coordinated: No Fine Motor Movements are Fluid and Coordinated: No Motor  Motor Motor: Within Functional Limits Motor - Skilled Clinical Observations: generalized weakness  Mobility Bed Mobility Bed Mobility: Rolling Right;Right Sidelying to Sit Rolling Right: Supervision/verbal cueing Right Sidelying to Sit: Supervision/Verbal cueing Supine to Sit: Supervision/Verbal cueing Sit to Supine: Supervision/Verbal cueing Transfers Transfers: Sit to Stand;Stand to Sit Sit to Stand: Supervision/Verbal cueing Stand to Sit: Supervision/Verbal cueing Stand Pivot Transfers: Supervision/Verbal cueing Transfer (Assistive device): None Locomotion  Gait Ambulation: Yes Gait Assistance: Supervision/Verbal cueing Gait Distance (Feet): 300 Feet Assistive device: None Gait Assistance Details: Verbal cues for precautions/safety Gait Gait: Yes Gait Pattern: Impaired Gait Pattern: Narrow base of support Gait velocity: decreased Stairs / Additional Locomotion Stairs: Yes Stairs Assistance: Supervision/Verbal cueing Stair Management Technique: No rails Number of Stairs: 12 Height of Stairs: 6 Curb: Supervision/Verbal cueing Wheelchair  Mobility Wheelchair Mobility: No  Trunk/Postural Assessment  Cervical Assessment Cervical Assessment: Exceptions to [facility] (forward head and rounded shoulders) Thoracic Assessment Thoracic Assessment: Exceptions to [facility] Lumbar Assessment Lumbar Assessment: Within Functional Limits Postural Control Postural Control: Within Functional Limits  Balance Balance Balance Assessed: Yes Static Sitting Balance Static Sitting - Balance Support: Feet supported Static Sitting - Level of Assistance: 7: Independent Static Standing Balance Static Standing - Balance Support: During functional activity;No  upper extremity supported Static Standing - Level of Assistance: 6: Modified independent (Device/Increase time) Dynamic Standing Balance Dynamic Standing - Balance Support: During functional activity;No upper extremity supported Dynamic Standing - Level of Assistance: 5: Stand by assistance (supervision) Extremity Assessment  RUE Assessment RUE Assessment: Exceptions to [facility] Active Range of Motion (AROM) Comments: ~ 75* Shoulder flexon seated, 120* supine General Strength Comments: NT RUE Body System: Ortho LUE Assessment LUE Assessment: Exceptions to [facility] Active Range of Motion (AROM) Comments: No movement allowed left shoulder elbow/forearm/wrist WFL General Strength Comments: NT LUE Body System: Ortho RLE Assessment RLE Assessment: Within Functional Limits Passive Range of Motion (PROM) Comments: WFLs LLE Assessment LLE Assessment: Exceptions to [facility] General Strength Comments: gross 3+/5; knee NT   [facility] 05/11/2023, 3:50 PM

## 2023-05-11 NOTE — Progress Notes (Signed)
Physical Therapy Session Note  Patient Details  Name: Morgan Potter MRN: [id number] Date of Birth: Aug 17, 1950  Today's Date: 05/11/2023 PT Individual Time: 1420-1532 PT Individual Time Calculation (min): 72 min   Short Term Goals: Week 1:  PT Short Term Goal 1 (Week 1): Pt will increase bed mobility to min A PT Short Term Goal 2 (Week 1): Pt will increase transfers to min A. PT Short Term Goal 3 (Week 1): Pt will ambulate without assistive device about 100 feet with min A PT Short Term Goal 4 (Week 1): Pt will ascend/descend 1 to 2 steps with no rails and min A PT Short Term Goal 5 (Week 1): Pt will propel w/c about 50 feet with B LEs and S  Skilled Therapeutic Interventions/Progress Updates:      Pt supine in bed upon arrival. Pt agreeable to therapy. Pt denies any pain.   Pt reports daughter is running late to family training as she got stuck in traffic. Pt expressed concerns about BSC not yet being delivered to room. Therapist followed up with covering social workers, and they report pt wanting bariatric BSC, and handout and education provided for purchasing on [employer] as bariatric BSC not covered by insurance, if pt wants standard BSC, need to contact [employer], pay copay and it will be delivered to room by discharge. Therapist relayed this information to patient and pt daughter, therapist daughter paid copay upon arrival.   Pt overall very emotionally labile throughout session and requiring frequent redirection to task and consoling.   Pt performed bed mobility, and sit to stand from elevated bed with supervision, verbal cues provided for maintenance of R UE NWBing precautions.   Pt ambulated with no AD from room to ortho gym with close supervision, intermittent CGA, needed for stability with pt agitated and distracted while tallking to pt daughter.   Pt performed ambulatory transfer to 33 inch high (ground to seat) car simulator to simulate height of [place] with backwards step  onto 6 inch step. Pt performed x2 with CGA/simulation, verbal cues provided for technique/sequencing. Recommending CGA for safety. Pt and pt daughter returned demonstration.   Pt ascended/descended 8 6 inch steps with no handrails and close supervision, pt demos recall of sequencing up with the good, down with the bad. Pt and daughter returned demonstration of stair navigation.   Pt and daughter deny any questions/concerns regarding discharge.   Pt sitting EOB with all needs within reach and bed alarm on with daughter in room.     Therapy Documentation Precautions:  Precautions Precautions: Shoulder, Fall Type of Shoulder Precautions: L UE avoid passive and active shoulder ROM x 2 weeks, OK unrestricted elbow/wrist ROM Shoulder Interventions: Shoulder sling/immobilizer Precaution Booklet Issued: No Precaution Comments: R UE unrestricted ROM, L LE OK for for 0 to 60 degrees knee flexion in hinged brace Required Braces or Orthoses: Sling, Other Brace Other Brace: L LE hinged knee brace Restrictions Weight Bearing Restrictions Per Provider Order: Yes RUE Weight Bearing Per Provider Order: Non weight bearing LUE Weight Bearing Per Provider Order: Non weight bearing LLE Weight Bearing Per Provider Order: Weight bearing as tolerated Other Position/Activity Restrictions: L UE sling and no shoulder ROM, R UE ROM unrestricted; playmakers brace 0-60 L knee   Therapy/Group: Individual Therapy  [facility] Ambrose Finland, [place], DPT  05/11/2023, 3:52 PM

## 2023-05-11 NOTE — Progress Notes (Signed)
Inpatient Rehabilitation Discharge Medication Review by a Pharmacist  A complete drug regimen review was completed for this patient to identify any potential clinically significant medication issues.  High Risk Drug Classes Is patient taking? Indication by Medication  Antipsychotic No   Anticoagulant Yes Apixaban - VTE prophylaxis  Antibiotic No   Opioid Yes Tramadol - pain  Antiplatelet No   Hypoglycemics/insulin No   Vasoactive Medication Yes Hyzaar - HTN  Chemotherapy No   Other Yes Buproprion - anxiety Cholecalciferol, iron, thiamine- supplementation Lexapro - MDD PPI - GERD Fluticasone and montelukast - asthma Ibuprofen - pain Ativan - anxiety Robaxin - muscle spasms Pravastatin - HLD Senna - docusate - constipation     Type of Medication Issue Identified Description of Issue Recommendation(s)  Drug Interaction(s) (clinically significant)     Duplicate Therapy     Allergy     No Medication Administration End Date     Incorrect Dose     Additional Drug Therapy Needed     Significant med changes from prior encounter (inform family/care partners about these prior to discharge).    Other       Clinically significant medication issues were identified that warrant physician communication and completion of prescribed/recommended actions by midnight of the next day:  No  Name of provider notified for urgent issues identified:   Provider Method of Notification:     Pharmacist comments:   Time spent performing this drug regimen review (minutes):  20  Jeanella Cara, PharmD, [place] Clinical Pharmacist Please see AMION for all Pharmacists' Contact Phone Numbers 05/11/2023, 8:28 AM

## 2023-05-12 DIAGNOSIS — T07XXXA Unspecified multiple injuries, initial encounter: Secondary | ICD-10-CM | POA: Diagnosis not present

## 2023-05-12 NOTE — Progress Notes (Signed)
Inpatient Rehabilitation Care Coordinator Discharge Note   Patient Details  Name: Morgan Potter MRN: [id number] Date of Birth: [birth year]   Discharge location: D/C home with assistance from daughter  Length of Stay: 9 Days  Discharge activity level: CGA  Home/community participation: Limited  Patient response UX:LKGMWN Literacy - How often do you need to have someone help you when you read instructions, pamphlets, or other written material from your doctor or pharmacy?: Never  Patient response UU:VOZDGU Isolation - How often do you feel lonely or isolated from those around you?: Never  Services provided included: RD, PT, OT, MD, SLP, CM, RN, TR, Pharmacy, Neuropsych, SW  Financial Services:  Field seismologist Utilized: Private Insurance [employer]  Choices offered to/list presented to: Patient  Follow-up services arranged:  Home Health, DME Home Health Agency: [facility]    DME : Purchading BSC privately    Patient response to transportation need: Is the patient able to respond to transportation needs?: Yes In the past 12 months, has lack of transportation kept you from medical appointments or from getting medications?: No In the past 12 months, has lack of transportation kept you from meetings, work, or from getting things needed for daily living?: No   Patient/Family verbalized understanding of follow-up arrangements:  Yes  Individual responsible for coordination of the follow-up plan: patient [phone]  Confirmed correct DME delivered: Gretchen Short 05/12/2023    Comments (or additional information):  Summary of Stay    Date/Time Discharge Planning CSW  05/11/23 1551 Home with dtr to assist. Dtr aware of 24/7 reccomendation. No follow up for family edu per primary SW. CJB  05/06/23 0841 Home with daughter assisting aware recommend 24/7 daughter also [place] of her four grandchildren-4 yo-16 yo. Pain and WB issues barriers to progress. RGD        Gretchen Short

## 2023-05-12 NOTE — Progress Notes (Signed)
PROGRESS NOTE   Subjective/Complaints: No new complaints this morning  Right eyebrow suture to be removed by nursing Discharging with home therapy   ROS: Patient denies fever, rash, sore throat, blurred vision, dizziness, nausea, vomiting, diarrhea, cough, shortness of breath or chest pain,  headache, or mood change. +bilateral quadriceps weakness-chronic per pt, +sutures in right eyebrow    Objective:   No results found.  Recent Labs    05/11/23 0516  WBC 6.0  HGB 10.2*  HCT 31.5*  PLT 553*    Recent Labs    05/11/23 0516  NA 139  K 3.9  CL 106  CO2 22  GLUCOSE 108*  BUN 17  CREATININE 0.97  CALCIUM 9.1     Intake/Output Summary (Last 24 hours) at 05/12/2023 0932 Last data filed at 05/12/2023 0700 Gross per 24 hour  Intake 578 ml  Output --  Net 578 ml        Physical Exam: Vital Signs Blood pressure (!) 160/76, pulse 75, temperature 98.1 F (36.7 C), temperature source Oral, resp. rate 16, height 5\' 9"  (1.753 m), weight 100.1 kg, SpO2 94%.  Constitutional: No distress . Vital signs reviewed. Resting in bed HEENT: NCAT, EOMI, oral membranes moist Neck: supple Cardiovascular: RRR without murmur. No JVD    Respiratory/Chest: CTA Bilaterally without wheezes or rales. Normal effort    GI/Abdomen: BS +, non-tender, non-distended, soft Ext: no clubbing, cyanosis, or edema. LUE in sling Psych: pleasant and cooperative, a little anxious  Skin:    General: Skin is warm and dry.     Comments:   bruising on B/L Ue's Dressing R bicep- down entire bicep- wound C/D/I KI on LLE-   Neurological:     Mental Status: She is alert.     Comments: Patient is alert no acute distress oriented x 3 and follows commands. Intact to light touch in all 4 extremities  Musc: left arm in sling. Musculoskeletal:     Cervical back: Neck supple. No tenderness.     Comments: Cannot test deltoid or biceps B/L- due to  humeral fx's or triceps WE/Grip and FA 5-/5 B/L  LE's- HF 4/5; KE on R 4+/5; NT on L due to KI; DF/PF 5/5 B/L Wearing sling on LUE- because not allowed to move L shoulder at all- and KI on LLE, stable 12/24   Assessment/Plan: 1. Functional deficits which require 3+ hours per day of interdisciplinary therapy in a comprehensive inpatient rehab setting. Physiatrist is providing close team supervision and 24 hour management of active medical problems listed below. Physiatrist and rehab team continue to assess barriers to discharge/monitor patient progress toward functional and medical goals  Care Tool:  Bathing    Body parts bathed by patient: Left arm, Right arm, Chest, Abdomen, Front perineal area, Buttocks, Right upper leg, Left upper leg, Right lower leg, Face   Body parts bathed by helper: Left lower leg     Bathing assist Assist Level: Minimal Assistance - Patient > 75%     Upper Body Dressing/Undressing Upper body dressing   What is the patient wearing?: Pull over shirt    Upper body assist Assist Level: Supervision/Verbal cueing  Lower Body Dressing/Undressing Lower body dressing      What is the patient wearing?: Pants, Orthosis     Lower body assist Assist for lower body dressing: Minimal Assistance - Patient > 75%     Toileting Toileting    Toileting assist Assist for toileting: Independent with assistive device     Transfers Chair/bed transfer  Transfers assist     Chair/bed transfer assist level: Supervision/Verbal cueing Chair/bed transfer assistive device: Orthosis   Locomotion Ambulation   Ambulation assist      Assist level: Supervision/Verbal cueing Assistive device: Orthosis Max distance: 300   Walk 10 feet activity   Assist     Assist level: Supervision/Verbal cueing Assistive device: No Device, Orthosis   Walk 50 feet activity   Assist    Assist level: Supervision/Verbal cueing Assistive device: No Device, Orthosis     Walk 150 feet activity   Assist Walk 150 feet activity did not occur: Safety/medical concerns  Assist level: Supervision/Verbal cueing Assistive device: No Device, Orthosis    Walk 10 feet on uneven surface  activity   Assist Walk 10 feet on uneven surfaces activity did not occur: Safety/medical concerns   Assist level: Supervision/Verbal cueing Assistive device: Orthosis   Wheelchair     Assist Is the patient using a wheelchair?: No Type of Wheelchair: Manual Wheelchair activity did not occur: N/A  Wheelchair assist level: Dependent - Patient 0% Max wheelchair distance: 150    Wheelchair 50 feet with 2 turns activity    Assist    Wheelchair 50 feet with 2 turns activity did not occur: N/A   Assist Level: Dependent - Patient 0%   Wheelchair 150 feet activity     Assist  Wheelchair 150 feet activity did not occur: N/A   Assist Level: Dependent - Patient 0%   Blood pressure (!) 160/76, pulse 75, temperature 98.1 F (36.7 C), temperature source Oral, resp. rate 16, height 5\' 9"  (1.753 m), weight 100.1 kg, SpO2 94%.    Medical Problem List and Plan: 1. Functional deficits secondary to polytrauma after fall 2012 12/06/2022 -patient may  shower- cover KI and her incisions and L sling- if can take shower, then able to             -ELOS/Goals: 14-16 days- min A D/c home -NWB B/L UE's- Sling in LUE with no Shoulder ROM for 2 weeks at all -RUE- ROM as tolerated -KI with WBAT on LLE 12/17 reassured pt that we will work through restrictions/precautions. She's anxious as she was having more difficulty moving/transferring before she fell. -decreased to 15/7 Discussed sending meds to TOC pharamcy 2.  Antithrombotics: D/c lovenox since ambulating 200 feetdaily check vascular study             -antiplatelet therapy: N/A 3. Pain Management: d/c hydrocodone given pruritus. Advil 400 mg scheduled TID  5. Neuropsych/cognition: This patient is capable of  making decisions on her own behalf. 6. Skin/Wound Care: Routine skin checks. R eyebrow sutured 12/7, likely ready to come out 12/17 or sooner 7. Fluids/Electrolytes/Nutrition: Routine in and outs with follow-up chemistries, continue vitamins 8.  Fracture involving the distal diaphysis of the right humerus.  Status post ORIF of humeral shaft fracture 04/27/2023.  Nonweightbearing, ROM as tolerated 9.  Comminuted mildly displaced fracture of the left humeral head.  Nonoperative.  Nonweightbearing.  Shoulder sling at all times, no shoulder ROM for 2wks. 10.  Nondisplaced transverse fracture left patella.  Weightbearing as tolerated..  Knee immobilizer at all  times 11.  Acute blood loss anemia.  Follow-up CBC.  Continue iron supplement 12.  Hypertension.  HCTZ 12.5 mg daily, Cozaar 50 mg daily.  Monitor with increased mobility -discussed the fact that her hyzaar is a combo med with hydrochlorothiazide, increase magnesium to 500mg  HS  -12/21-22/24 BP a little variable, monitor for now Vitals:   05/08/23 1715 05/08/23 2022 05/09/23 0524 05/09/23 1336  BP: (!) 153/72 135/70 (!) 151/76 (!) 130/56   05/09/23 1937 05/10/23 0709 05/10/23 1634 05/10/23 1938  BP: 116/66 (!) 151/73 134/70 118/64   05/11/23 0519 05/11/23 1327 05/11/23 2014 05/12/23 0400  BP: (!) 160/80 105/78 129/69 (!) 160/76    13.  Constipation.  Colace 100 mg twice daily, d/c miralax. continue Senokot S 2 tabs nightly. D/c suppositorty. Milk of mag ordered 12/20  -05/10/23 LBM today, monitor            14.  Hyperlipidemia.  Continue Pravachol 20mg  daily  15.  GERD.  continue Protonix 40mg  daily. Add magnesium supplement HS as can be depleted by Protonix.   16. Pruritus: benadryl prn ordered, continue  17. Anxiety: continue wellbutrin 300mg  daily and scheduled Ativan 1mg  BID, lexapro 20mg  daily  18. Right eyebrow suture: discussed that nursing is to remove suture today   >30 minutes spent in discharge of patient including review of  medications and follow-up appointments, physical examination, and in answering all patient's questions   LOS: 10 days A FACE TO FACE EVALUATION WAS PERFORMED  Drema Pry Amrit Erck 05/12/2023, 9:32 AM

## 2023-05-12 NOTE — Plan of Care (Signed)

## 2023-05-12 NOTE — Progress Notes (Signed)
Inpatient Rehabilitation Discharge Medication Review by a Pharmacist   A complete drug regimen review was completed for this patient to identify any potential clinically significant medication issues.   High Risk Drug Classes Is patient taking? Indication by Medication  Antipsychotic No    Anticoagulant Yes Eliquis (through 05/28/2023 and stop ) - VTE prophylaxis  Antibiotic No    Opioid Yes Tramadol- pain management  Antiplatelet No    Hypoglycemics/insulin No    Vasoactive Medication Yes Losartan-hydrochlorothiazide - hypertension  Chemotherapy No    Other Yes Bupropion XL, escitalopram - anxiety/depression Lorazepam- anxiety Esomeprazole - GERD Pravastatin - hyperlipidemia Ferrous sulfate, Vitamin D, Vitamin B complex +C, thiamine - supplements Montelukast, Fluticasone nasal - allergies Senna-docusate hs - laxatives Acetaminophen, Ibuprofen- pain management Methocarbamol- muscle spasms         Type of Medication Issue Identified Description of Issue Recommendation(s)  Drug Interaction(s) (clinically significant)        Duplicate Therapy        Allergy        No Medication Administration End Date        Incorrect Dose        Additional Drug Therapy Needed        Significant med changes from prior encounter (inform family/care partners about these prior to discharge).      PRN ibuprofen for mild pain.   Follow up for continuing Fluticasone nasal, Montelukast.    Resume while in CIR or at discharge.   Communicate changes with patient/family prior to discharge.  Other    Resume home Hyzaar and Esomeprazole at discharge.      Clinically significant medication issues were identified that warrant physician communication and completion of prescribed/recommended actions by midnight of the next day:  No   Name of provider notified for urgent issues identified:    Provider Method of Notification:    Pharmacist comments:     Time spent performing this drug regimen  review (minutes):  15   Noah Delaine, [place] Clinical Pharmacist 05/12/2023 7:40 AM

## 2023-05-15 DIAGNOSIS — K219 Gastro-esophageal reflux disease without esophagitis: Secondary | ICD-10-CM | POA: Diagnosis not present

## 2023-05-15 DIAGNOSIS — H269 Unspecified cataract: Secondary | ICD-10-CM | POA: Diagnosis not present

## 2023-05-15 DIAGNOSIS — Z7901 Long term (current) use of anticoagulants: Secondary | ICD-10-CM | POA: Diagnosis not present

## 2023-05-15 DIAGNOSIS — Z9181 History of falling: Secondary | ICD-10-CM | POA: Diagnosis not present

## 2023-05-15 DIAGNOSIS — Z556 Problems related to health literacy: Secondary | ICD-10-CM | POA: Diagnosis not present

## 2023-05-15 DIAGNOSIS — Z79899 Other long term (current) drug therapy: Secondary | ICD-10-CM | POA: Diagnosis not present

## 2023-05-15 DIAGNOSIS — I10 Essential (primary) hypertension: Secondary | ICD-10-CM | POA: Diagnosis not present

## 2023-05-15 DIAGNOSIS — S42491D Other displaced fracture of lower end of right humerus, subsequent encounter for fracture with routine healing: Secondary | ICD-10-CM | POA: Diagnosis not present

## 2023-05-15 DIAGNOSIS — F419 Anxiety disorder, unspecified: Secondary | ICD-10-CM | POA: Diagnosis not present

## 2023-05-15 DIAGNOSIS — D62 Acute posthemorrhagic anemia: Secondary | ICD-10-CM | POA: Diagnosis not present

## 2023-05-15 DIAGNOSIS — E785 Hyperlipidemia, unspecified: Secondary | ICD-10-CM | POA: Diagnosis not present

## 2023-05-15 DIAGNOSIS — S42352D Displaced comminuted fracture of shaft of humerus, left arm, subsequent encounter for fracture with routine healing: Secondary | ICD-10-CM | POA: Diagnosis not present

## 2023-05-15 DIAGNOSIS — S82035D Nondisplaced transverse fracture of left patella, subsequent encounter for closed fracture with routine healing: Secondary | ICD-10-CM | POA: Diagnosis not present

## 2023-05-19 DIAGNOSIS — S42352D Displaced comminuted fracture of shaft of humerus, left arm, subsequent encounter for fracture with routine healing: Secondary | ICD-10-CM | POA: Diagnosis not present

## 2023-05-19 DIAGNOSIS — E785 Hyperlipidemia, unspecified: Secondary | ICD-10-CM | POA: Diagnosis not present

## 2023-05-19 DIAGNOSIS — Z9181 History of falling: Secondary | ICD-10-CM | POA: Diagnosis not present

## 2023-05-19 DIAGNOSIS — Z79899 Other long term (current) drug therapy: Secondary | ICD-10-CM | POA: Diagnosis not present

## 2023-05-19 DIAGNOSIS — I10 Essential (primary) hypertension: Secondary | ICD-10-CM | POA: Diagnosis not present

## 2023-05-19 DIAGNOSIS — H269 Unspecified cataract: Secondary | ICD-10-CM | POA: Diagnosis not present

## 2023-05-19 DIAGNOSIS — K219 Gastro-esophageal reflux disease without esophagitis: Secondary | ICD-10-CM | POA: Diagnosis not present

## 2023-05-19 DIAGNOSIS — Z7901 Long term (current) use of anticoagulants: Secondary | ICD-10-CM | POA: Diagnosis not present

## 2023-05-19 DIAGNOSIS — Z556 Problems related to health literacy: Secondary | ICD-10-CM | POA: Diagnosis not present

## 2023-05-19 DIAGNOSIS — S82035D Nondisplaced transverse fracture of left patella, subsequent encounter for closed fracture with routine healing: Secondary | ICD-10-CM | POA: Diagnosis not present

## 2023-05-19 DIAGNOSIS — D62 Acute posthemorrhagic anemia: Secondary | ICD-10-CM | POA: Diagnosis not present

## 2023-05-19 DIAGNOSIS — S42491D Other displaced fracture of lower end of right humerus, subsequent encounter for fracture with routine healing: Secondary | ICD-10-CM | POA: Diagnosis not present

## 2023-05-19 DIAGNOSIS — F419 Anxiety disorder, unspecified: Secondary | ICD-10-CM | POA: Diagnosis not present

## 2023-05-20 DIAGNOSIS — C4490 Unspecified malignant neoplasm of skin, unspecified: Secondary | ICD-10-CM

## 2023-05-20 HISTORY — DX: Unspecified malignant neoplasm of skin, unspecified: C44.90

## 2023-05-22 DIAGNOSIS — H269 Unspecified cataract: Secondary | ICD-10-CM | POA: Diagnosis not present

## 2023-05-22 DIAGNOSIS — K219 Gastro-esophageal reflux disease without esophagitis: Secondary | ICD-10-CM | POA: Diagnosis not present

## 2023-05-22 DIAGNOSIS — S42352D Displaced comminuted fracture of shaft of humerus, left arm, subsequent encounter for fracture with routine healing: Secondary | ICD-10-CM | POA: Diagnosis not present

## 2023-05-22 DIAGNOSIS — F419 Anxiety disorder, unspecified: Secondary | ICD-10-CM | POA: Diagnosis not present

## 2023-05-22 DIAGNOSIS — Z79899 Other long term (current) drug therapy: Secondary | ICD-10-CM | POA: Diagnosis not present

## 2023-05-22 DIAGNOSIS — S82035D Nondisplaced transverse fracture of left patella, subsequent encounter for closed fracture with routine healing: Secondary | ICD-10-CM | POA: Diagnosis not present

## 2023-05-22 DIAGNOSIS — Z9181 History of falling: Secondary | ICD-10-CM | POA: Diagnosis not present

## 2023-05-22 DIAGNOSIS — E785 Hyperlipidemia, unspecified: Secondary | ICD-10-CM | POA: Diagnosis not present

## 2023-05-22 DIAGNOSIS — I10 Essential (primary) hypertension: Secondary | ICD-10-CM | POA: Diagnosis not present

## 2023-05-22 DIAGNOSIS — Z7901 Long term (current) use of anticoagulants: Secondary | ICD-10-CM | POA: Diagnosis not present

## 2023-05-22 DIAGNOSIS — S42491D Other displaced fracture of lower end of right humerus, subsequent encounter for fracture with routine healing: Secondary | ICD-10-CM | POA: Diagnosis not present

## 2023-05-22 DIAGNOSIS — D62 Acute posthemorrhagic anemia: Secondary | ICD-10-CM | POA: Diagnosis not present

## 2023-05-22 DIAGNOSIS — Z556 Problems related to health literacy: Secondary | ICD-10-CM | POA: Diagnosis not present

## 2023-05-26 DIAGNOSIS — S42351D Displaced comminuted fracture of shaft of humerus, right arm, subsequent encounter for fracture with routine healing: Secondary | ICD-10-CM | POA: Diagnosis not present

## 2023-05-26 DIAGNOSIS — S82002D Unspecified fracture of left patella, subsequent encounter for closed fracture with routine healing: Secondary | ICD-10-CM | POA: Diagnosis not present

## 2023-05-26 DIAGNOSIS — S42292D Other displaced fracture of upper end of left humerus, subsequent encounter for fracture with routine healing: Secondary | ICD-10-CM | POA: Diagnosis not present

## 2023-05-27 DIAGNOSIS — I10 Essential (primary) hypertension: Secondary | ICD-10-CM | POA: Diagnosis not present

## 2023-05-27 DIAGNOSIS — Z79899 Other long term (current) drug therapy: Secondary | ICD-10-CM | POA: Diagnosis not present

## 2023-05-27 DIAGNOSIS — H269 Unspecified cataract: Secondary | ICD-10-CM | POA: Diagnosis not present

## 2023-05-27 DIAGNOSIS — F419 Anxiety disorder, unspecified: Secondary | ICD-10-CM | POA: Diagnosis not present

## 2023-05-27 DIAGNOSIS — D62 Acute posthemorrhagic anemia: Secondary | ICD-10-CM | POA: Diagnosis not present

## 2023-05-27 DIAGNOSIS — Z556 Problems related to health literacy: Secondary | ICD-10-CM | POA: Diagnosis not present

## 2023-05-27 DIAGNOSIS — Z9181 History of falling: Secondary | ICD-10-CM | POA: Diagnosis not present

## 2023-05-27 DIAGNOSIS — S82035D Nondisplaced transverse fracture of left patella, subsequent encounter for closed fracture with routine healing: Secondary | ICD-10-CM | POA: Diagnosis not present

## 2023-05-27 DIAGNOSIS — S42352D Displaced comminuted fracture of shaft of humerus, left arm, subsequent encounter for fracture with routine healing: Secondary | ICD-10-CM | POA: Diagnosis not present

## 2023-05-27 DIAGNOSIS — S42491D Other displaced fracture of lower end of right humerus, subsequent encounter for fracture with routine healing: Secondary | ICD-10-CM | POA: Diagnosis not present

## 2023-05-27 DIAGNOSIS — Z7901 Long term (current) use of anticoagulants: Secondary | ICD-10-CM | POA: Diagnosis not present

## 2023-05-27 DIAGNOSIS — E785 Hyperlipidemia, unspecified: Secondary | ICD-10-CM | POA: Diagnosis not present

## 2023-05-27 DIAGNOSIS — K219 Gastro-esophageal reflux disease without esophagitis: Secondary | ICD-10-CM | POA: Diagnosis not present

## 2023-05-29 DIAGNOSIS — K219 Gastro-esophageal reflux disease without esophagitis: Secondary | ICD-10-CM | POA: Diagnosis not present

## 2023-05-29 DIAGNOSIS — E785 Hyperlipidemia, unspecified: Secondary | ICD-10-CM | POA: Diagnosis not present

## 2023-05-29 DIAGNOSIS — I10 Essential (primary) hypertension: Secondary | ICD-10-CM | POA: Diagnosis not present

## 2023-05-29 DIAGNOSIS — H269 Unspecified cataract: Secondary | ICD-10-CM | POA: Diagnosis not present

## 2023-05-29 DIAGNOSIS — Z9181 History of falling: Secondary | ICD-10-CM | POA: Diagnosis not present

## 2023-05-29 DIAGNOSIS — S42491D Other displaced fracture of lower end of right humerus, subsequent encounter for fracture with routine healing: Secondary | ICD-10-CM | POA: Diagnosis not present

## 2023-05-29 DIAGNOSIS — Z79899 Other long term (current) drug therapy: Secondary | ICD-10-CM | POA: Diagnosis not present

## 2023-05-29 DIAGNOSIS — Z556 Problems related to health literacy: Secondary | ICD-10-CM | POA: Diagnosis not present

## 2023-05-29 DIAGNOSIS — D62 Acute posthemorrhagic anemia: Secondary | ICD-10-CM | POA: Diagnosis not present

## 2023-05-29 DIAGNOSIS — S82035D Nondisplaced transverse fracture of left patella, subsequent encounter for closed fracture with routine healing: Secondary | ICD-10-CM | POA: Diagnosis not present

## 2023-05-29 DIAGNOSIS — S42352D Displaced comminuted fracture of shaft of humerus, left arm, subsequent encounter for fracture with routine healing: Secondary | ICD-10-CM | POA: Diagnosis not present

## 2023-05-29 DIAGNOSIS — F419 Anxiety disorder, unspecified: Secondary | ICD-10-CM | POA: Diagnosis not present

## 2023-05-29 DIAGNOSIS — Z7901 Long term (current) use of anticoagulants: Secondary | ICD-10-CM | POA: Diagnosis not present

## 2023-06-02 DIAGNOSIS — K219 Gastro-esophageal reflux disease without esophagitis: Secondary | ICD-10-CM | POA: Diagnosis not present

## 2023-06-02 DIAGNOSIS — I10 Essential (primary) hypertension: Secondary | ICD-10-CM | POA: Diagnosis not present

## 2023-06-02 DIAGNOSIS — F419 Anxiety disorder, unspecified: Secondary | ICD-10-CM | POA: Diagnosis not present

## 2023-06-02 DIAGNOSIS — S42352D Displaced comminuted fracture of shaft of humerus, left arm, subsequent encounter for fracture with routine healing: Secondary | ICD-10-CM | POA: Diagnosis not present

## 2023-06-02 DIAGNOSIS — S82035D Nondisplaced transverse fracture of left patella, subsequent encounter for closed fracture with routine healing: Secondary | ICD-10-CM | POA: Diagnosis not present

## 2023-06-02 DIAGNOSIS — E785 Hyperlipidemia, unspecified: Secondary | ICD-10-CM | POA: Diagnosis not present

## 2023-06-02 DIAGNOSIS — D62 Acute posthemorrhagic anemia: Secondary | ICD-10-CM | POA: Diagnosis not present

## 2023-06-02 DIAGNOSIS — Z79899 Other long term (current) drug therapy: Secondary | ICD-10-CM | POA: Diagnosis not present

## 2023-06-02 DIAGNOSIS — H269 Unspecified cataract: Secondary | ICD-10-CM | POA: Diagnosis not present

## 2023-06-02 DIAGNOSIS — S42491D Other displaced fracture of lower end of right humerus, subsequent encounter for fracture with routine healing: Secondary | ICD-10-CM | POA: Diagnosis not present

## 2023-06-02 DIAGNOSIS — Z7901 Long term (current) use of anticoagulants: Secondary | ICD-10-CM | POA: Diagnosis not present

## 2023-06-02 DIAGNOSIS — Z556 Problems related to health literacy: Secondary | ICD-10-CM | POA: Diagnosis not present

## 2023-06-02 DIAGNOSIS — Z9181 History of falling: Secondary | ICD-10-CM | POA: Diagnosis not present

## 2023-06-03 DIAGNOSIS — Z79899 Other long term (current) drug therapy: Secondary | ICD-10-CM | POA: Diagnosis not present

## 2023-06-03 DIAGNOSIS — E785 Hyperlipidemia, unspecified: Secondary | ICD-10-CM | POA: Diagnosis not present

## 2023-06-03 DIAGNOSIS — Z556 Problems related to health literacy: Secondary | ICD-10-CM | POA: Diagnosis not present

## 2023-06-03 DIAGNOSIS — K219 Gastro-esophageal reflux disease without esophagitis: Secondary | ICD-10-CM | POA: Diagnosis not present

## 2023-06-03 DIAGNOSIS — I10 Essential (primary) hypertension: Secondary | ICD-10-CM | POA: Diagnosis not present

## 2023-06-03 DIAGNOSIS — S82035D Nondisplaced transverse fracture of left patella, subsequent encounter for closed fracture with routine healing: Secondary | ICD-10-CM | POA: Diagnosis not present

## 2023-06-03 DIAGNOSIS — D62 Acute posthemorrhagic anemia: Secondary | ICD-10-CM | POA: Diagnosis not present

## 2023-06-03 DIAGNOSIS — H269 Unspecified cataract: Secondary | ICD-10-CM | POA: Diagnosis not present

## 2023-06-03 DIAGNOSIS — S42352D Displaced comminuted fracture of shaft of humerus, left arm, subsequent encounter for fracture with routine healing: Secondary | ICD-10-CM | POA: Diagnosis not present

## 2023-06-03 DIAGNOSIS — Z7901 Long term (current) use of anticoagulants: Secondary | ICD-10-CM | POA: Diagnosis not present

## 2023-06-03 DIAGNOSIS — S42491D Other displaced fracture of lower end of right humerus, subsequent encounter for fracture with routine healing: Secondary | ICD-10-CM | POA: Diagnosis not present

## 2023-06-03 DIAGNOSIS — Z9181 History of falling: Secondary | ICD-10-CM | POA: Diagnosis not present

## 2023-06-03 DIAGNOSIS — F419 Anxiety disorder, unspecified: Secondary | ICD-10-CM | POA: Diagnosis not present

## 2023-06-04 DIAGNOSIS — F331 Major depressive disorder, recurrent, moderate: Secondary | ICD-10-CM | POA: Diagnosis not present

## 2023-06-04 DIAGNOSIS — F411 Generalized anxiety disorder: Secondary | ICD-10-CM | POA: Diagnosis not present

## 2023-06-05 DIAGNOSIS — K219 Gastro-esophageal reflux disease without esophagitis: Secondary | ICD-10-CM | POA: Diagnosis not present

## 2023-06-05 DIAGNOSIS — H269 Unspecified cataract: Secondary | ICD-10-CM | POA: Diagnosis not present

## 2023-06-05 DIAGNOSIS — S82035D Nondisplaced transverse fracture of left patella, subsequent encounter for closed fracture with routine healing: Secondary | ICD-10-CM | POA: Diagnosis not present

## 2023-06-05 DIAGNOSIS — Z556 Problems related to health literacy: Secondary | ICD-10-CM | POA: Diagnosis not present

## 2023-06-05 DIAGNOSIS — I10 Essential (primary) hypertension: Secondary | ICD-10-CM | POA: Diagnosis not present

## 2023-06-05 DIAGNOSIS — F419 Anxiety disorder, unspecified: Secondary | ICD-10-CM | POA: Diagnosis not present

## 2023-06-05 DIAGNOSIS — D62 Acute posthemorrhagic anemia: Secondary | ICD-10-CM | POA: Diagnosis not present

## 2023-06-05 DIAGNOSIS — Z9181 History of falling: Secondary | ICD-10-CM | POA: Diagnosis not present

## 2023-06-05 DIAGNOSIS — S42352D Displaced comminuted fracture of shaft of humerus, left arm, subsequent encounter for fracture with routine healing: Secondary | ICD-10-CM | POA: Diagnosis not present

## 2023-06-05 DIAGNOSIS — S42491D Other displaced fracture of lower end of right humerus, subsequent encounter for fracture with routine healing: Secondary | ICD-10-CM | POA: Diagnosis not present

## 2023-06-05 DIAGNOSIS — Z79899 Other long term (current) drug therapy: Secondary | ICD-10-CM | POA: Diagnosis not present

## 2023-06-05 DIAGNOSIS — Z7901 Long term (current) use of anticoagulants: Secondary | ICD-10-CM | POA: Diagnosis not present

## 2023-06-05 DIAGNOSIS — E785 Hyperlipidemia, unspecified: Secondary | ICD-10-CM | POA: Diagnosis not present

## 2023-06-10 DIAGNOSIS — D62 Acute posthemorrhagic anemia: Secondary | ICD-10-CM | POA: Diagnosis not present

## 2023-06-10 DIAGNOSIS — S42352D Displaced comminuted fracture of shaft of humerus, left arm, subsequent encounter for fracture with routine healing: Secondary | ICD-10-CM | POA: Diagnosis not present

## 2023-06-10 DIAGNOSIS — I10 Essential (primary) hypertension: Secondary | ICD-10-CM | POA: Diagnosis not present

## 2023-06-10 DIAGNOSIS — S82035D Nondisplaced transverse fracture of left patella, subsequent encounter for closed fracture with routine healing: Secondary | ICD-10-CM | POA: Diagnosis not present

## 2023-06-10 DIAGNOSIS — F419 Anxiety disorder, unspecified: Secondary | ICD-10-CM | POA: Diagnosis not present

## 2023-06-10 DIAGNOSIS — H269 Unspecified cataract: Secondary | ICD-10-CM | POA: Diagnosis not present

## 2023-06-10 DIAGNOSIS — Z556 Problems related to health literacy: Secondary | ICD-10-CM | POA: Diagnosis not present

## 2023-06-10 DIAGNOSIS — S42491D Other displaced fracture of lower end of right humerus, subsequent encounter for fracture with routine healing: Secondary | ICD-10-CM | POA: Diagnosis not present

## 2023-06-10 DIAGNOSIS — Z7901 Long term (current) use of anticoagulants: Secondary | ICD-10-CM | POA: Diagnosis not present

## 2023-06-10 DIAGNOSIS — E785 Hyperlipidemia, unspecified: Secondary | ICD-10-CM | POA: Diagnosis not present

## 2023-06-10 DIAGNOSIS — Z9181 History of falling: Secondary | ICD-10-CM | POA: Diagnosis not present

## 2023-06-10 DIAGNOSIS — Z79899 Other long term (current) drug therapy: Secondary | ICD-10-CM | POA: Diagnosis not present

## 2023-06-10 DIAGNOSIS — K219 Gastro-esophageal reflux disease without esophagitis: Secondary | ICD-10-CM | POA: Diagnosis not present

## 2023-06-11 DIAGNOSIS — Z79899 Other long term (current) drug therapy: Secondary | ICD-10-CM | POA: Diagnosis not present

## 2023-06-11 DIAGNOSIS — S42352D Displaced comminuted fracture of shaft of humerus, left arm, subsequent encounter for fracture with routine healing: Secondary | ICD-10-CM | POA: Diagnosis not present

## 2023-06-11 DIAGNOSIS — S42491D Other displaced fracture of lower end of right humerus, subsequent encounter for fracture with routine healing: Secondary | ICD-10-CM | POA: Diagnosis not present

## 2023-06-11 DIAGNOSIS — Z556 Problems related to health literacy: Secondary | ICD-10-CM | POA: Diagnosis not present

## 2023-06-11 DIAGNOSIS — S82035D Nondisplaced transverse fracture of left patella, subsequent encounter for closed fracture with routine healing: Secondary | ICD-10-CM | POA: Diagnosis not present

## 2023-06-11 DIAGNOSIS — K219 Gastro-esophageal reflux disease without esophagitis: Secondary | ICD-10-CM | POA: Diagnosis not present

## 2023-06-11 DIAGNOSIS — D62 Acute posthemorrhagic anemia: Secondary | ICD-10-CM | POA: Diagnosis not present

## 2023-06-11 DIAGNOSIS — I10 Essential (primary) hypertension: Secondary | ICD-10-CM | POA: Diagnosis not present

## 2023-06-11 DIAGNOSIS — Z9181 History of falling: Secondary | ICD-10-CM | POA: Diagnosis not present

## 2023-06-11 DIAGNOSIS — Z7901 Long term (current) use of anticoagulants: Secondary | ICD-10-CM | POA: Diagnosis not present

## 2023-06-11 DIAGNOSIS — F419 Anxiety disorder, unspecified: Secondary | ICD-10-CM | POA: Diagnosis not present

## 2023-06-11 DIAGNOSIS — E785 Hyperlipidemia, unspecified: Secondary | ICD-10-CM | POA: Diagnosis not present

## 2023-06-11 DIAGNOSIS — H269 Unspecified cataract: Secondary | ICD-10-CM | POA: Diagnosis not present

## 2023-06-12 DIAGNOSIS — Z9181 History of falling: Secondary | ICD-10-CM | POA: Diagnosis not present

## 2023-06-12 DIAGNOSIS — S42352D Displaced comminuted fracture of shaft of humerus, left arm, subsequent encounter for fracture with routine healing: Secondary | ICD-10-CM | POA: Diagnosis not present

## 2023-06-12 DIAGNOSIS — D62 Acute posthemorrhagic anemia: Secondary | ICD-10-CM | POA: Diagnosis not present

## 2023-06-12 DIAGNOSIS — H269 Unspecified cataract: Secondary | ICD-10-CM | POA: Diagnosis not present

## 2023-06-12 DIAGNOSIS — Z7901 Long term (current) use of anticoagulants: Secondary | ICD-10-CM | POA: Diagnosis not present

## 2023-06-12 DIAGNOSIS — S82035D Nondisplaced transverse fracture of left patella, subsequent encounter for closed fracture with routine healing: Secondary | ICD-10-CM | POA: Diagnosis not present

## 2023-06-12 DIAGNOSIS — I10 Essential (primary) hypertension: Secondary | ICD-10-CM | POA: Diagnosis not present

## 2023-06-12 DIAGNOSIS — S42491D Other displaced fracture of lower end of right humerus, subsequent encounter for fracture with routine healing: Secondary | ICD-10-CM | POA: Diagnosis not present

## 2023-06-12 DIAGNOSIS — Z556 Problems related to health literacy: Secondary | ICD-10-CM | POA: Diagnosis not present

## 2023-06-12 DIAGNOSIS — Z79899 Other long term (current) drug therapy: Secondary | ICD-10-CM | POA: Diagnosis not present

## 2023-06-12 DIAGNOSIS — E785 Hyperlipidemia, unspecified: Secondary | ICD-10-CM | POA: Diagnosis not present

## 2023-06-12 DIAGNOSIS — F419 Anxiety disorder, unspecified: Secondary | ICD-10-CM | POA: Diagnosis not present

## 2023-06-12 DIAGNOSIS — K219 Gastro-esophageal reflux disease without esophagitis: Secondary | ICD-10-CM | POA: Diagnosis not present

## 2023-06-14 DIAGNOSIS — Z79899 Other long term (current) drug therapy: Secondary | ICD-10-CM | POA: Diagnosis not present

## 2023-06-14 DIAGNOSIS — H269 Unspecified cataract: Secondary | ICD-10-CM | POA: Diagnosis not present

## 2023-06-14 DIAGNOSIS — S82035D Nondisplaced transverse fracture of left patella, subsequent encounter for closed fracture with routine healing: Secondary | ICD-10-CM | POA: Diagnosis not present

## 2023-06-14 DIAGNOSIS — S42352D Displaced comminuted fracture of shaft of humerus, left arm, subsequent encounter for fracture with routine healing: Secondary | ICD-10-CM | POA: Diagnosis not present

## 2023-06-14 DIAGNOSIS — Z556 Problems related to health literacy: Secondary | ICD-10-CM | POA: Diagnosis not present

## 2023-06-14 DIAGNOSIS — E785 Hyperlipidemia, unspecified: Secondary | ICD-10-CM | POA: Diagnosis not present

## 2023-06-14 DIAGNOSIS — S42491D Other displaced fracture of lower end of right humerus, subsequent encounter for fracture with routine healing: Secondary | ICD-10-CM | POA: Diagnosis not present

## 2023-06-14 DIAGNOSIS — Z9181 History of falling: Secondary | ICD-10-CM | POA: Diagnosis not present

## 2023-06-14 DIAGNOSIS — F419 Anxiety disorder, unspecified: Secondary | ICD-10-CM | POA: Diagnosis not present

## 2023-06-14 DIAGNOSIS — Z7901 Long term (current) use of anticoagulants: Secondary | ICD-10-CM | POA: Diagnosis not present

## 2023-06-14 DIAGNOSIS — K219 Gastro-esophageal reflux disease without esophagitis: Secondary | ICD-10-CM | POA: Diagnosis not present

## 2023-06-14 DIAGNOSIS — D62 Acute posthemorrhagic anemia: Secondary | ICD-10-CM | POA: Diagnosis not present

## 2023-06-14 DIAGNOSIS — I10 Essential (primary) hypertension: Secondary | ICD-10-CM | POA: Diagnosis not present

## 2023-06-16 DIAGNOSIS — S42491D Other displaced fracture of lower end of right humerus, subsequent encounter for fracture with routine healing: Secondary | ICD-10-CM | POA: Diagnosis not present

## 2023-06-16 DIAGNOSIS — Z9181 History of falling: Secondary | ICD-10-CM | POA: Diagnosis not present

## 2023-06-16 DIAGNOSIS — S82035D Nondisplaced transverse fracture of left patella, subsequent encounter for closed fracture with routine healing: Secondary | ICD-10-CM | POA: Diagnosis not present

## 2023-06-16 DIAGNOSIS — K219 Gastro-esophageal reflux disease without esophagitis: Secondary | ICD-10-CM | POA: Diagnosis not present

## 2023-06-16 DIAGNOSIS — Z7901 Long term (current) use of anticoagulants: Secondary | ICD-10-CM | POA: Diagnosis not present

## 2023-06-16 DIAGNOSIS — H269 Unspecified cataract: Secondary | ICD-10-CM | POA: Diagnosis not present

## 2023-06-16 DIAGNOSIS — I10 Essential (primary) hypertension: Secondary | ICD-10-CM | POA: Diagnosis not present

## 2023-06-16 DIAGNOSIS — Z556 Problems related to health literacy: Secondary | ICD-10-CM | POA: Diagnosis not present

## 2023-06-16 DIAGNOSIS — E785 Hyperlipidemia, unspecified: Secondary | ICD-10-CM | POA: Diagnosis not present

## 2023-06-16 DIAGNOSIS — S42352D Displaced comminuted fracture of shaft of humerus, left arm, subsequent encounter for fracture with routine healing: Secondary | ICD-10-CM | POA: Diagnosis not present

## 2023-06-16 DIAGNOSIS — D62 Acute posthemorrhagic anemia: Secondary | ICD-10-CM | POA: Diagnosis not present

## 2023-06-16 DIAGNOSIS — F419 Anxiety disorder, unspecified: Secondary | ICD-10-CM | POA: Diagnosis not present

## 2023-06-16 DIAGNOSIS — Z79899 Other long term (current) drug therapy: Secondary | ICD-10-CM | POA: Diagnosis not present

## 2023-06-17 DIAGNOSIS — S42491D Other displaced fracture of lower end of right humerus, subsequent encounter for fracture with routine healing: Secondary | ICD-10-CM | POA: Diagnosis not present

## 2023-06-17 DIAGNOSIS — I10 Essential (primary) hypertension: Secondary | ICD-10-CM | POA: Diagnosis not present

## 2023-06-17 DIAGNOSIS — K219 Gastro-esophageal reflux disease without esophagitis: Secondary | ICD-10-CM | POA: Diagnosis not present

## 2023-06-17 DIAGNOSIS — E785 Hyperlipidemia, unspecified: Secondary | ICD-10-CM | POA: Diagnosis not present

## 2023-06-17 DIAGNOSIS — D62 Acute posthemorrhagic anemia: Secondary | ICD-10-CM | POA: Diagnosis not present

## 2023-06-17 DIAGNOSIS — S82035D Nondisplaced transverse fracture of left patella, subsequent encounter for closed fracture with routine healing: Secondary | ICD-10-CM | POA: Diagnosis not present

## 2023-06-17 DIAGNOSIS — Z9181 History of falling: Secondary | ICD-10-CM | POA: Diagnosis not present

## 2023-06-17 DIAGNOSIS — Z556 Problems related to health literacy: Secondary | ICD-10-CM | POA: Diagnosis not present

## 2023-06-17 DIAGNOSIS — H269 Unspecified cataract: Secondary | ICD-10-CM | POA: Diagnosis not present

## 2023-06-17 DIAGNOSIS — Z79899 Other long term (current) drug therapy: Secondary | ICD-10-CM | POA: Diagnosis not present

## 2023-06-17 DIAGNOSIS — F419 Anxiety disorder, unspecified: Secondary | ICD-10-CM | POA: Diagnosis not present

## 2023-06-17 DIAGNOSIS — Z7901 Long term (current) use of anticoagulants: Secondary | ICD-10-CM | POA: Diagnosis not present

## 2023-06-17 DIAGNOSIS — S42352D Displaced comminuted fracture of shaft of humerus, left arm, subsequent encounter for fracture with routine healing: Secondary | ICD-10-CM | POA: Diagnosis not present

## 2023-06-23 DIAGNOSIS — S82002D Unspecified fracture of left patella, subsequent encounter for closed fracture with routine healing: Secondary | ICD-10-CM | POA: Diagnosis not present

## 2023-06-23 DIAGNOSIS — S42351D Displaced comminuted fracture of shaft of humerus, right arm, subsequent encounter for fracture with routine healing: Secondary | ICD-10-CM | POA: Diagnosis not present

## 2023-06-23 DIAGNOSIS — S42292D Other displaced fracture of upper end of left humerus, subsequent encounter for fracture with routine healing: Secondary | ICD-10-CM | POA: Diagnosis not present

## 2023-06-25 DIAGNOSIS — Z556 Problems related to health literacy: Secondary | ICD-10-CM | POA: Diagnosis not present

## 2023-06-25 DIAGNOSIS — I10 Essential (primary) hypertension: Secondary | ICD-10-CM | POA: Diagnosis not present

## 2023-06-25 DIAGNOSIS — K219 Gastro-esophageal reflux disease without esophagitis: Secondary | ICD-10-CM | POA: Diagnosis not present

## 2023-06-25 DIAGNOSIS — Z9181 History of falling: Secondary | ICD-10-CM | POA: Diagnosis not present

## 2023-06-25 DIAGNOSIS — S42352D Displaced comminuted fracture of shaft of humerus, left arm, subsequent encounter for fracture with routine healing: Secondary | ICD-10-CM | POA: Diagnosis not present

## 2023-06-25 DIAGNOSIS — S42491D Other displaced fracture of lower end of right humerus, subsequent encounter for fracture with routine healing: Secondary | ICD-10-CM | POA: Diagnosis not present

## 2023-06-25 DIAGNOSIS — F419 Anxiety disorder, unspecified: Secondary | ICD-10-CM | POA: Diagnosis not present

## 2023-06-25 DIAGNOSIS — S82035D Nondisplaced transverse fracture of left patella, subsequent encounter for closed fracture with routine healing: Secondary | ICD-10-CM | POA: Diagnosis not present

## 2023-06-25 DIAGNOSIS — Z79899 Other long term (current) drug therapy: Secondary | ICD-10-CM | POA: Diagnosis not present

## 2023-06-25 DIAGNOSIS — D62 Acute posthemorrhagic anemia: Secondary | ICD-10-CM | POA: Diagnosis not present

## 2023-06-25 DIAGNOSIS — E785 Hyperlipidemia, unspecified: Secondary | ICD-10-CM | POA: Diagnosis not present

## 2023-06-25 DIAGNOSIS — Z7901 Long term (current) use of anticoagulants: Secondary | ICD-10-CM | POA: Diagnosis not present

## 2023-06-25 DIAGNOSIS — H269 Unspecified cataract: Secondary | ICD-10-CM | POA: Diagnosis not present

## 2023-06-26 DIAGNOSIS — E782 Mixed hyperlipidemia: Secondary | ICD-10-CM | POA: Diagnosis not present

## 2023-06-26 DIAGNOSIS — R252 Cramp and spasm: Secondary | ICD-10-CM | POA: Diagnosis not present

## 2023-06-26 DIAGNOSIS — Z791 Long term (current) use of non-steroidal anti-inflammatories (NSAID): Secondary | ICD-10-CM | POA: Diagnosis not present

## 2023-06-26 DIAGNOSIS — Z1330 Encounter for screening examination for mental health and behavioral disorders, unspecified: Secondary | ICD-10-CM | POA: Diagnosis not present

## 2023-06-26 DIAGNOSIS — M810 Age-related osteoporosis without current pathological fracture: Secondary | ICD-10-CM | POA: Diagnosis not present

## 2023-06-30 DIAGNOSIS — E785 Hyperlipidemia, unspecified: Secondary | ICD-10-CM | POA: Diagnosis not present

## 2023-06-30 DIAGNOSIS — S42352D Displaced comminuted fracture of shaft of humerus, left arm, subsequent encounter for fracture with routine healing: Secondary | ICD-10-CM | POA: Diagnosis not present

## 2023-06-30 DIAGNOSIS — Z7901 Long term (current) use of anticoagulants: Secondary | ICD-10-CM | POA: Diagnosis not present

## 2023-06-30 DIAGNOSIS — H269 Unspecified cataract: Secondary | ICD-10-CM | POA: Diagnosis not present

## 2023-06-30 DIAGNOSIS — Z9181 History of falling: Secondary | ICD-10-CM | POA: Diagnosis not present

## 2023-06-30 DIAGNOSIS — S42491D Other displaced fracture of lower end of right humerus, subsequent encounter for fracture with routine healing: Secondary | ICD-10-CM | POA: Diagnosis not present

## 2023-06-30 DIAGNOSIS — K219 Gastro-esophageal reflux disease without esophagitis: Secondary | ICD-10-CM | POA: Diagnosis not present

## 2023-06-30 DIAGNOSIS — I10 Essential (primary) hypertension: Secondary | ICD-10-CM | POA: Diagnosis not present

## 2023-06-30 DIAGNOSIS — F419 Anxiety disorder, unspecified: Secondary | ICD-10-CM | POA: Diagnosis not present

## 2023-06-30 DIAGNOSIS — D62 Acute posthemorrhagic anemia: Secondary | ICD-10-CM | POA: Diagnosis not present

## 2023-06-30 DIAGNOSIS — Z556 Problems related to health literacy: Secondary | ICD-10-CM | POA: Diagnosis not present

## 2023-06-30 DIAGNOSIS — Z79899 Other long term (current) drug therapy: Secondary | ICD-10-CM | POA: Diagnosis not present

## 2023-06-30 DIAGNOSIS — S82035D Nondisplaced transverse fracture of left patella, subsequent encounter for closed fracture with routine healing: Secondary | ICD-10-CM | POA: Diagnosis not present

## 2023-07-01 ENCOUNTER — Other Ambulatory Visit (HOSPITAL_COMMUNITY): Payer: Self-pay

## 2023-07-01 DIAGNOSIS — S82035D Nondisplaced transverse fracture of left patella, subsequent encounter for closed fracture with routine healing: Secondary | ICD-10-CM | POA: Diagnosis not present

## 2023-07-01 DIAGNOSIS — S42352D Displaced comminuted fracture of shaft of humerus, left arm, subsequent encounter for fracture with routine healing: Secondary | ICD-10-CM | POA: Diagnosis not present

## 2023-07-01 DIAGNOSIS — H269 Unspecified cataract: Secondary | ICD-10-CM | POA: Diagnosis not present

## 2023-07-01 DIAGNOSIS — K219 Gastro-esophageal reflux disease without esophagitis: Secondary | ICD-10-CM | POA: Diagnosis not present

## 2023-07-01 DIAGNOSIS — F419 Anxiety disorder, unspecified: Secondary | ICD-10-CM | POA: Diagnosis not present

## 2023-07-01 DIAGNOSIS — Z7901 Long term (current) use of anticoagulants: Secondary | ICD-10-CM | POA: Diagnosis not present

## 2023-07-01 DIAGNOSIS — Z9181 History of falling: Secondary | ICD-10-CM | POA: Diagnosis not present

## 2023-07-01 DIAGNOSIS — Z79899 Other long term (current) drug therapy: Secondary | ICD-10-CM | POA: Diagnosis not present

## 2023-07-01 DIAGNOSIS — Z556 Problems related to health literacy: Secondary | ICD-10-CM | POA: Diagnosis not present

## 2023-07-01 DIAGNOSIS — D62 Acute posthemorrhagic anemia: Secondary | ICD-10-CM | POA: Diagnosis not present

## 2023-07-01 DIAGNOSIS — I10 Essential (primary) hypertension: Secondary | ICD-10-CM | POA: Diagnosis not present

## 2023-07-01 DIAGNOSIS — S42491D Other displaced fracture of lower end of right humerus, subsequent encounter for fracture with routine healing: Secondary | ICD-10-CM | POA: Diagnosis not present

## 2023-07-01 DIAGNOSIS — E785 Hyperlipidemia, unspecified: Secondary | ICD-10-CM | POA: Diagnosis not present

## 2023-07-10 DIAGNOSIS — Z79899 Other long term (current) drug therapy: Secondary | ICD-10-CM | POA: Diagnosis not present

## 2023-07-10 DIAGNOSIS — S82035D Nondisplaced transverse fracture of left patella, subsequent encounter for closed fracture with routine healing: Secondary | ICD-10-CM | POA: Diagnosis not present

## 2023-07-10 DIAGNOSIS — S42352D Displaced comminuted fracture of shaft of humerus, left arm, subsequent encounter for fracture with routine healing: Secondary | ICD-10-CM | POA: Diagnosis not present

## 2023-07-10 DIAGNOSIS — Z9181 History of falling: Secondary | ICD-10-CM | POA: Diagnosis not present

## 2023-07-10 DIAGNOSIS — K219 Gastro-esophageal reflux disease without esophagitis: Secondary | ICD-10-CM | POA: Diagnosis not present

## 2023-07-10 DIAGNOSIS — H269 Unspecified cataract: Secondary | ICD-10-CM | POA: Diagnosis not present

## 2023-07-10 DIAGNOSIS — I10 Essential (primary) hypertension: Secondary | ICD-10-CM | POA: Diagnosis not present

## 2023-07-10 DIAGNOSIS — Z556 Problems related to health literacy: Secondary | ICD-10-CM | POA: Diagnosis not present

## 2023-07-10 DIAGNOSIS — F419 Anxiety disorder, unspecified: Secondary | ICD-10-CM | POA: Diagnosis not present

## 2023-07-10 DIAGNOSIS — S42491D Other displaced fracture of lower end of right humerus, subsequent encounter for fracture with routine healing: Secondary | ICD-10-CM | POA: Diagnosis not present

## 2023-07-10 DIAGNOSIS — Z7901 Long term (current) use of anticoagulants: Secondary | ICD-10-CM | POA: Diagnosis not present

## 2023-07-10 DIAGNOSIS — D62 Acute posthemorrhagic anemia: Secondary | ICD-10-CM | POA: Diagnosis not present

## 2023-07-10 DIAGNOSIS — E785 Hyperlipidemia, unspecified: Secondary | ICD-10-CM | POA: Diagnosis not present

## 2023-07-11 DIAGNOSIS — Z556 Problems related to health literacy: Secondary | ICD-10-CM | POA: Diagnosis not present

## 2023-07-11 DIAGNOSIS — F419 Anxiety disorder, unspecified: Secondary | ICD-10-CM | POA: Diagnosis not present

## 2023-07-11 DIAGNOSIS — Z9181 History of falling: Secondary | ICD-10-CM | POA: Diagnosis not present

## 2023-07-11 DIAGNOSIS — H269 Unspecified cataract: Secondary | ICD-10-CM | POA: Diagnosis not present

## 2023-07-11 DIAGNOSIS — Z79899 Other long term (current) drug therapy: Secondary | ICD-10-CM | POA: Diagnosis not present

## 2023-07-11 DIAGNOSIS — S42352D Displaced comminuted fracture of shaft of humerus, left arm, subsequent encounter for fracture with routine healing: Secondary | ICD-10-CM | POA: Diagnosis not present

## 2023-07-11 DIAGNOSIS — I10 Essential (primary) hypertension: Secondary | ICD-10-CM | POA: Diagnosis not present

## 2023-07-11 DIAGNOSIS — S42491D Other displaced fracture of lower end of right humerus, subsequent encounter for fracture with routine healing: Secondary | ICD-10-CM | POA: Diagnosis not present

## 2023-07-11 DIAGNOSIS — Z7901 Long term (current) use of anticoagulants: Secondary | ICD-10-CM | POA: Diagnosis not present

## 2023-07-11 DIAGNOSIS — K219 Gastro-esophageal reflux disease without esophagitis: Secondary | ICD-10-CM | POA: Diagnosis not present

## 2023-07-11 DIAGNOSIS — D62 Acute posthemorrhagic anemia: Secondary | ICD-10-CM | POA: Diagnosis not present

## 2023-07-11 DIAGNOSIS — S82035D Nondisplaced transverse fracture of left patella, subsequent encounter for closed fracture with routine healing: Secondary | ICD-10-CM | POA: Diagnosis not present

## 2023-07-11 DIAGNOSIS — E785 Hyperlipidemia, unspecified: Secondary | ICD-10-CM | POA: Diagnosis not present

## 2023-07-13 DIAGNOSIS — M810 Age-related osteoporosis without current pathological fracture: Secondary | ICD-10-CM | POA: Diagnosis not present

## 2023-07-13 DIAGNOSIS — R252 Cramp and spasm: Secondary | ICD-10-CM | POA: Diagnosis not present

## 2023-07-13 DIAGNOSIS — Z791 Long term (current) use of non-steroidal anti-inflammatories (NSAID): Secondary | ICD-10-CM | POA: Diagnosis not present

## 2023-07-13 DIAGNOSIS — E782 Mixed hyperlipidemia: Secondary | ICD-10-CM | POA: Diagnosis not present

## 2023-07-14 DIAGNOSIS — S42491D Other displaced fracture of lower end of right humerus, subsequent encounter for fracture with routine healing: Secondary | ICD-10-CM | POA: Diagnosis not present

## 2023-07-14 DIAGNOSIS — K219 Gastro-esophageal reflux disease without esophagitis: Secondary | ICD-10-CM | POA: Diagnosis not present

## 2023-07-14 DIAGNOSIS — Z7901 Long term (current) use of anticoagulants: Secondary | ICD-10-CM | POA: Diagnosis not present

## 2023-07-14 DIAGNOSIS — Z9181 History of falling: Secondary | ICD-10-CM | POA: Diagnosis not present

## 2023-07-14 DIAGNOSIS — E785 Hyperlipidemia, unspecified: Secondary | ICD-10-CM | POA: Diagnosis not present

## 2023-07-14 DIAGNOSIS — Z79899 Other long term (current) drug therapy: Secondary | ICD-10-CM | POA: Diagnosis not present

## 2023-07-14 DIAGNOSIS — F419 Anxiety disorder, unspecified: Secondary | ICD-10-CM | POA: Diagnosis not present

## 2023-07-14 DIAGNOSIS — I10 Essential (primary) hypertension: Secondary | ICD-10-CM | POA: Diagnosis not present

## 2023-07-14 DIAGNOSIS — H269 Unspecified cataract: Secondary | ICD-10-CM | POA: Diagnosis not present

## 2023-07-14 DIAGNOSIS — S82035D Nondisplaced transverse fracture of left patella, subsequent encounter for closed fracture with routine healing: Secondary | ICD-10-CM | POA: Diagnosis not present

## 2023-07-14 DIAGNOSIS — Z556 Problems related to health literacy: Secondary | ICD-10-CM | POA: Diagnosis not present

## 2023-07-14 DIAGNOSIS — S42352D Displaced comminuted fracture of shaft of humerus, left arm, subsequent encounter for fracture with routine healing: Secondary | ICD-10-CM | POA: Diagnosis not present

## 2023-07-14 DIAGNOSIS — D62 Acute posthemorrhagic anemia: Secondary | ICD-10-CM | POA: Diagnosis not present

## 2023-07-17 DIAGNOSIS — Z79899 Other long term (current) drug therapy: Secondary | ICD-10-CM | POA: Diagnosis not present

## 2023-07-17 DIAGNOSIS — E785 Hyperlipidemia, unspecified: Secondary | ICD-10-CM | POA: Diagnosis not present

## 2023-07-17 DIAGNOSIS — Z556 Problems related to health literacy: Secondary | ICD-10-CM | POA: Diagnosis not present

## 2023-07-17 DIAGNOSIS — F419 Anxiety disorder, unspecified: Secondary | ICD-10-CM | POA: Diagnosis not present

## 2023-07-17 DIAGNOSIS — I10 Essential (primary) hypertension: Secondary | ICD-10-CM | POA: Diagnosis not present

## 2023-07-17 DIAGNOSIS — K219 Gastro-esophageal reflux disease without esophagitis: Secondary | ICD-10-CM | POA: Diagnosis not present

## 2023-07-17 DIAGNOSIS — S82035D Nondisplaced transverse fracture of left patella, subsequent encounter for closed fracture with routine healing: Secondary | ICD-10-CM | POA: Diagnosis not present

## 2023-07-17 DIAGNOSIS — D62 Acute posthemorrhagic anemia: Secondary | ICD-10-CM | POA: Diagnosis not present

## 2023-07-17 DIAGNOSIS — Z9181 History of falling: Secondary | ICD-10-CM | POA: Diagnosis not present

## 2023-07-17 DIAGNOSIS — H269 Unspecified cataract: Secondary | ICD-10-CM | POA: Diagnosis not present

## 2023-07-17 DIAGNOSIS — Z7901 Long term (current) use of anticoagulants: Secondary | ICD-10-CM | POA: Diagnosis not present

## 2023-07-17 DIAGNOSIS — S42352D Displaced comminuted fracture of shaft of humerus, left arm, subsequent encounter for fracture with routine healing: Secondary | ICD-10-CM | POA: Diagnosis not present

## 2023-07-17 DIAGNOSIS — S42491D Other displaced fracture of lower end of right humerus, subsequent encounter for fracture with routine healing: Secondary | ICD-10-CM | POA: Diagnosis not present

## 2023-07-23 DIAGNOSIS — H25812 Combined forms of age-related cataract, left eye: Secondary | ICD-10-CM | POA: Diagnosis not present

## 2023-07-24 DIAGNOSIS — Z9181 History of falling: Secondary | ICD-10-CM | POA: Diagnosis not present

## 2023-07-24 DIAGNOSIS — Z79899 Other long term (current) drug therapy: Secondary | ICD-10-CM | POA: Diagnosis not present

## 2023-07-24 DIAGNOSIS — F419 Anxiety disorder, unspecified: Secondary | ICD-10-CM | POA: Diagnosis not present

## 2023-07-24 DIAGNOSIS — I10 Essential (primary) hypertension: Secondary | ICD-10-CM | POA: Diagnosis not present

## 2023-07-24 DIAGNOSIS — Z7901 Long term (current) use of anticoagulants: Secondary | ICD-10-CM | POA: Diagnosis not present

## 2023-07-24 DIAGNOSIS — D62 Acute posthemorrhagic anemia: Secondary | ICD-10-CM | POA: Diagnosis not present

## 2023-07-24 DIAGNOSIS — Z556 Problems related to health literacy: Secondary | ICD-10-CM | POA: Diagnosis not present

## 2023-07-24 DIAGNOSIS — K219 Gastro-esophageal reflux disease without esophagitis: Secondary | ICD-10-CM | POA: Diagnosis not present

## 2023-07-24 DIAGNOSIS — S42491D Other displaced fracture of lower end of right humerus, subsequent encounter for fracture with routine healing: Secondary | ICD-10-CM | POA: Diagnosis not present

## 2023-07-24 DIAGNOSIS — E785 Hyperlipidemia, unspecified: Secondary | ICD-10-CM | POA: Diagnosis not present

## 2023-07-24 DIAGNOSIS — H269 Unspecified cataract: Secondary | ICD-10-CM | POA: Diagnosis not present

## 2023-07-24 DIAGNOSIS — S82035D Nondisplaced transverse fracture of left patella, subsequent encounter for closed fracture with routine healing: Secondary | ICD-10-CM | POA: Diagnosis not present

## 2023-07-24 DIAGNOSIS — S42352D Displaced comminuted fracture of shaft of humerus, left arm, subsequent encounter for fracture with routine healing: Secondary | ICD-10-CM | POA: Diagnosis not present

## 2023-07-27 DIAGNOSIS — Z79899 Other long term (current) drug therapy: Secondary | ICD-10-CM | POA: Diagnosis not present

## 2023-07-27 DIAGNOSIS — Z7901 Long term (current) use of anticoagulants: Secondary | ICD-10-CM | POA: Diagnosis not present

## 2023-07-27 DIAGNOSIS — I10 Essential (primary) hypertension: Secondary | ICD-10-CM | POA: Diagnosis not present

## 2023-07-27 DIAGNOSIS — D62 Acute posthemorrhagic anemia: Secondary | ICD-10-CM | POA: Diagnosis not present

## 2023-07-27 DIAGNOSIS — H269 Unspecified cataract: Secondary | ICD-10-CM | POA: Diagnosis not present

## 2023-07-27 DIAGNOSIS — E785 Hyperlipidemia, unspecified: Secondary | ICD-10-CM | POA: Diagnosis not present

## 2023-07-27 DIAGNOSIS — S42352D Displaced comminuted fracture of shaft of humerus, left arm, subsequent encounter for fracture with routine healing: Secondary | ICD-10-CM | POA: Diagnosis not present

## 2023-07-27 DIAGNOSIS — Z9181 History of falling: Secondary | ICD-10-CM | POA: Diagnosis not present

## 2023-07-27 DIAGNOSIS — F419 Anxiety disorder, unspecified: Secondary | ICD-10-CM | POA: Diagnosis not present

## 2023-07-27 DIAGNOSIS — Z556 Problems related to health literacy: Secondary | ICD-10-CM | POA: Diagnosis not present

## 2023-07-27 DIAGNOSIS — K219 Gastro-esophageal reflux disease without esophagitis: Secondary | ICD-10-CM | POA: Diagnosis not present

## 2023-07-27 DIAGNOSIS — S42491D Other displaced fracture of lower end of right humerus, subsequent encounter for fracture with routine healing: Secondary | ICD-10-CM | POA: Diagnosis not present

## 2023-07-27 DIAGNOSIS — S82035D Nondisplaced transverse fracture of left patella, subsequent encounter for closed fracture with routine healing: Secondary | ICD-10-CM | POA: Diagnosis not present

## 2023-07-31 DIAGNOSIS — E785 Hyperlipidemia, unspecified: Secondary | ICD-10-CM | POA: Diagnosis not present

## 2023-07-31 DIAGNOSIS — Z79899 Other long term (current) drug therapy: Secondary | ICD-10-CM | POA: Diagnosis not present

## 2023-07-31 DIAGNOSIS — I10 Essential (primary) hypertension: Secondary | ICD-10-CM | POA: Diagnosis not present

## 2023-07-31 DIAGNOSIS — S82035D Nondisplaced transverse fracture of left patella, subsequent encounter for closed fracture with routine healing: Secondary | ICD-10-CM | POA: Diagnosis not present

## 2023-07-31 DIAGNOSIS — S42352D Displaced comminuted fracture of shaft of humerus, left arm, subsequent encounter for fracture with routine healing: Secondary | ICD-10-CM | POA: Diagnosis not present

## 2023-07-31 DIAGNOSIS — Z7901 Long term (current) use of anticoagulants: Secondary | ICD-10-CM | POA: Diagnosis not present

## 2023-07-31 DIAGNOSIS — Z556 Problems related to health literacy: Secondary | ICD-10-CM | POA: Diagnosis not present

## 2023-07-31 DIAGNOSIS — H269 Unspecified cataract: Secondary | ICD-10-CM | POA: Diagnosis not present

## 2023-07-31 DIAGNOSIS — D62 Acute posthemorrhagic anemia: Secondary | ICD-10-CM | POA: Diagnosis not present

## 2023-07-31 DIAGNOSIS — F419 Anxiety disorder, unspecified: Secondary | ICD-10-CM | POA: Diagnosis not present

## 2023-07-31 DIAGNOSIS — Z9181 History of falling: Secondary | ICD-10-CM | POA: Diagnosis not present

## 2023-07-31 DIAGNOSIS — S42491D Other displaced fracture of lower end of right humerus, subsequent encounter for fracture with routine healing: Secondary | ICD-10-CM | POA: Diagnosis not present

## 2023-07-31 DIAGNOSIS — K219 Gastro-esophageal reflux disease without esophagitis: Secondary | ICD-10-CM | POA: Diagnosis not present

## 2023-08-03 DIAGNOSIS — S42352D Displaced comminuted fracture of shaft of humerus, left arm, subsequent encounter for fracture with routine healing: Secondary | ICD-10-CM | POA: Diagnosis not present

## 2023-08-03 DIAGNOSIS — K219 Gastro-esophageal reflux disease without esophagitis: Secondary | ICD-10-CM | POA: Diagnosis not present

## 2023-08-03 DIAGNOSIS — Z79899 Other long term (current) drug therapy: Secondary | ICD-10-CM | POA: Diagnosis not present

## 2023-08-03 DIAGNOSIS — S82035D Nondisplaced transverse fracture of left patella, subsequent encounter for closed fracture with routine healing: Secondary | ICD-10-CM | POA: Diagnosis not present

## 2023-08-03 DIAGNOSIS — Z9181 History of falling: Secondary | ICD-10-CM | POA: Diagnosis not present

## 2023-08-03 DIAGNOSIS — Z7901 Long term (current) use of anticoagulants: Secondary | ICD-10-CM | POA: Diagnosis not present

## 2023-08-03 DIAGNOSIS — I10 Essential (primary) hypertension: Secondary | ICD-10-CM | POA: Diagnosis not present

## 2023-08-03 DIAGNOSIS — F419 Anxiety disorder, unspecified: Secondary | ICD-10-CM | POA: Diagnosis not present

## 2023-08-03 DIAGNOSIS — S42491D Other displaced fracture of lower end of right humerus, subsequent encounter for fracture with routine healing: Secondary | ICD-10-CM | POA: Diagnosis not present

## 2023-08-03 DIAGNOSIS — D62 Acute posthemorrhagic anemia: Secondary | ICD-10-CM | POA: Diagnosis not present

## 2023-08-03 DIAGNOSIS — E785 Hyperlipidemia, unspecified: Secondary | ICD-10-CM | POA: Diagnosis not present

## 2023-08-03 DIAGNOSIS — Z556 Problems related to health literacy: Secondary | ICD-10-CM | POA: Diagnosis not present

## 2023-08-03 DIAGNOSIS — H269 Unspecified cataract: Secondary | ICD-10-CM | POA: Diagnosis not present

## 2023-08-04 DIAGNOSIS — S42351D Displaced comminuted fracture of shaft of humerus, right arm, subsequent encounter for fracture with routine healing: Secondary | ICD-10-CM | POA: Diagnosis not present

## 2023-08-04 DIAGNOSIS — S82002D Unspecified fracture of left patella, subsequent encounter for closed fracture with routine healing: Secondary | ICD-10-CM | POA: Diagnosis not present

## 2023-08-04 DIAGNOSIS — S42292D Other displaced fracture of upper end of left humerus, subsequent encounter for fracture with routine healing: Secondary | ICD-10-CM | POA: Diagnosis not present

## 2023-08-10 DIAGNOSIS — Z7901 Long term (current) use of anticoagulants: Secondary | ICD-10-CM | POA: Diagnosis not present

## 2023-08-10 DIAGNOSIS — D62 Acute posthemorrhagic anemia: Secondary | ICD-10-CM | POA: Diagnosis not present

## 2023-08-10 DIAGNOSIS — S82035D Nondisplaced transverse fracture of left patella, subsequent encounter for closed fracture with routine healing: Secondary | ICD-10-CM | POA: Diagnosis not present

## 2023-08-10 DIAGNOSIS — H269 Unspecified cataract: Secondary | ICD-10-CM | POA: Diagnosis not present

## 2023-08-10 DIAGNOSIS — K219 Gastro-esophageal reflux disease without esophagitis: Secondary | ICD-10-CM | POA: Diagnosis not present

## 2023-08-10 DIAGNOSIS — E785 Hyperlipidemia, unspecified: Secondary | ICD-10-CM | POA: Diagnosis not present

## 2023-08-10 DIAGNOSIS — F419 Anxiety disorder, unspecified: Secondary | ICD-10-CM | POA: Diagnosis not present

## 2023-08-10 DIAGNOSIS — I10 Essential (primary) hypertension: Secondary | ICD-10-CM | POA: Diagnosis not present

## 2023-08-10 DIAGNOSIS — S42352D Displaced comminuted fracture of shaft of humerus, left arm, subsequent encounter for fracture with routine healing: Secondary | ICD-10-CM | POA: Diagnosis not present

## 2023-08-10 DIAGNOSIS — S42491D Other displaced fracture of lower end of right humerus, subsequent encounter for fracture with routine healing: Secondary | ICD-10-CM | POA: Diagnosis not present

## 2023-08-10 DIAGNOSIS — Z9181 History of falling: Secondary | ICD-10-CM | POA: Diagnosis not present

## 2023-08-10 DIAGNOSIS — Z79899 Other long term (current) drug therapy: Secondary | ICD-10-CM | POA: Diagnosis not present

## 2023-08-10 DIAGNOSIS — Z556 Problems related to health literacy: Secondary | ICD-10-CM | POA: Diagnosis not present

## 2023-08-11 DIAGNOSIS — G4700 Insomnia, unspecified: Secondary | ICD-10-CM | POA: Diagnosis not present

## 2023-08-11 DIAGNOSIS — F331 Major depressive disorder, recurrent, moderate: Secondary | ICD-10-CM | POA: Diagnosis not present

## 2023-08-11 DIAGNOSIS — F411 Generalized anxiety disorder: Secondary | ICD-10-CM | POA: Diagnosis not present

## 2023-08-12 DIAGNOSIS — H25812 Combined forms of age-related cataract, left eye: Secondary | ICD-10-CM | POA: Diagnosis not present

## 2023-08-12 DIAGNOSIS — T8529XA Other mechanical complication of intraocular lens, initial encounter: Secondary | ICD-10-CM | POA: Diagnosis not present

## 2023-08-12 DIAGNOSIS — T85391A Other mechanical complication of prosthetic orbit of left eye, initial encounter: Secondary | ICD-10-CM | POA: Diagnosis not present

## 2023-09-10 DIAGNOSIS — Z961 Presence of intraocular lens: Secondary | ICD-10-CM | POA: Diagnosis not present

## 2023-09-18 DIAGNOSIS — M1712 Unilateral primary osteoarthritis, left knee: Secondary | ICD-10-CM | POA: Diagnosis not present

## 2023-09-22 ENCOUNTER — Encounter: Payer: Self-pay | Admitting: Urgent Care

## 2023-09-22 ENCOUNTER — Ambulatory Visit: Admitting: Urgent Care

## 2023-09-22 VITALS — BP 120/80 | HR 90 | Ht 68.0 in | Wt 216.1 lb

## 2023-09-22 DIAGNOSIS — R42 Dizziness and giddiness: Secondary | ICD-10-CM

## 2023-09-22 DIAGNOSIS — R296 Repeated falls: Secondary | ICD-10-CM | POA: Diagnosis not present

## 2023-09-22 DIAGNOSIS — E785 Hyperlipidemia, unspecified: Secondary | ICD-10-CM

## 2023-09-22 DIAGNOSIS — R52 Pain, unspecified: Secondary | ICD-10-CM | POA: Diagnosis not present

## 2023-09-22 DIAGNOSIS — F419 Anxiety disorder, unspecified: Secondary | ICD-10-CM | POA: Diagnosis not present

## 2023-09-22 DIAGNOSIS — R700 Elevated erythrocyte sedimentation rate: Secondary | ICD-10-CM

## 2023-09-22 DIAGNOSIS — F32A Depression, unspecified: Secondary | ICD-10-CM

## 2023-09-22 DIAGNOSIS — D649 Anemia, unspecified: Secondary | ICD-10-CM

## 2023-09-22 DIAGNOSIS — I10 Essential (primary) hypertension: Secondary | ICD-10-CM | POA: Diagnosis not present

## 2023-09-22 DIAGNOSIS — R5382 Chronic fatigue, unspecified: Secondary | ICD-10-CM | POA: Diagnosis not present

## 2023-09-22 LAB — COMPREHENSIVE METABOLIC PANEL WITH GFR
ALT: 11 U/L (ref 0–35)
AST: 16 U/L (ref 0–37)
Albumin: 4.5 g/dL (ref 3.5–5.2)
Alkaline Phosphatase: 63 U/L (ref 39–117)
BUN: 18 mg/dL (ref 6–23)
CO2: 27 meq/L (ref 19–32)
Calcium: 9.8 mg/dL (ref 8.4–10.5)
Chloride: 100 meq/L (ref 96–112)
Creatinine, Ser: 0.89 mg/dL (ref 0.40–1.20)
GFR: 63.5 mL/min (ref >60.00)
Glucose, Bld: 103 mg/dL — ABNORMAL HIGH (ref 70–99)
Potassium: 4.7 meq/L (ref 3.5–5.1)
Sodium: 137 meq/L (ref 135–145)
Total Bilirubin: 0.5 mg/dL (ref 0.2–1.2)
Total Protein: 7.1 g/dL (ref 6.0–8.3)

## 2023-09-22 LAB — LIPID PANEL
Cholesterol: 249 mg/dL — ABNORMAL HIGH (ref 0–200)
HDL: 71.7 mg/dL (ref >39.00)
LDL Cholesterol: 157 mg/dL — ABNORMAL HIGH (ref 0–99)
NonHDL: 177.13
Total CHOL/HDL Ratio: 3
Triglycerides: 102 mg/dL (ref 0.0–149.0)
VLDL: 20.4 mg/dL (ref 0.0–40.0)

## 2023-09-22 LAB — CBC WITH DIFFERENTIAL/PLATELET
Basophils Absolute: 0.1 10*3/uL (ref 0.0–0.1)
Basophils Relative: 1 % (ref 0.0–3.0)
Eosinophils Absolute: 0.2 10*3/uL (ref 0.0–0.7)
Eosinophils Relative: 3.7 % (ref 0.0–5.0)
HCT: 33.4 % — ABNORMAL LOW (ref 36.0–46.0)
Hemoglobin: 11 g/dL — ABNORMAL LOW (ref 12.0–15.0)
Lymphocytes Relative: 32.7 % (ref 12.0–46.0)
Lymphs Abs: 1.8 10*3/uL (ref 0.7–4.0)
MCHC: 32.9 g/dL (ref 30.0–36.0)
MCV: 90.4 fl (ref 78.0–100.0)
Monocytes Absolute: 0.5 10*3/uL (ref 0.1–1.0)
Monocytes Relative: 8.4 % (ref 3.0–12.0)
Neutro Abs: 3 10*3/uL (ref 1.4–7.7)
Neutrophils Relative %: 54.2 % (ref 43.0–77.0)
Platelets: 461 10*3/uL — ABNORMAL HIGH (ref 150.0–400.0)
RBC: 3.69 Mil/uL — ABNORMAL LOW (ref 3.87–5.11)
RDW: 15.3 % (ref 11.5–15.5)
WBC: 5.6 10*3/uL (ref 4.0–10.5)

## 2023-09-22 LAB — B12 AND FOLATE PANEL
Folate: 16.8 ng/mL (ref >5.9)
Vitamin B-12: 205 pg/mL — ABNORMAL LOW (ref 211–911)

## 2023-09-22 LAB — TSH: TSH: 1.6 u[IU]/mL (ref 0.35–5.50)

## 2023-09-22 LAB — VITAMIN D 25 HYDROXY (VIT D DEFICIENCY, FRACTURES): VITD: 37.46 ng/mL (ref 30.00–100.00)

## 2023-09-22 LAB — MAGNESIUM: Magnesium: 2.2 mg/dL (ref 1.5–2.5)

## 2023-09-22 LAB — C-REACTIVE PROTEIN: CRP: <1 mg/dL (ref 0.5–20.0)

## 2023-09-22 LAB — HEMOGLOBIN A1C: Hgb A1c MFr Bld: 6.3 % (ref 4.6–6.5)

## 2023-09-22 LAB — SEDIMENTATION RATE: Sed Rate: 26 mm/h (ref 0–30)

## 2023-09-22 NOTE — Patient Instructions (Signed)
 We drew labs today to assess the cause of your symptoms  For now, please stop your singulair  (montelukast ). Please try to drink at least six 16 oz glasses of water daily as dehydration can lead to dizziness.  Please return for recheck in 10 days.

## 2023-09-23 NOTE — Progress Notes (Signed)
 New Patient Office Visit  Subjective:  Patient ID: Morgan Potter, female    DOB: 21-Feb-1951  Age: 73 y.o. MRN: [id number]  CC:  Chief Complaint  Patient presents with   Establish Care    New pt. Est care. She had a fall in December and broke a few bones and has been having body pain and balance issues. Pt would like a referral to rheumatology to rule out fibromyalgia if needed.    HPI Morgan Potter presents to establish care.  Discussed the use of AI scribe software for clinical note transcription with the patient, who gave verbal consent to proceed.  History of Present Illness   Morgan Potter is a 73 year old female who presents with dizziness and gait problems following a fall in December.  She has been experiencing dizziness and gait problems since a fall on December 7th. She feels unsafe while walking and driving, with a significant decrease in her ability to walk fast. These symptoms have been present for a couple of years, worsening over time.  In December, she fell while carrying multiple items and tripped over a cement flower pot, resulting in a humeral shaft fracture. She underwent surgery and was hospitalized for seven days, followed by ten days of in-house physical therapy. Post-hospitalization, she received home physical therapy for her legs and arms for about two months. Despite these interventions, she continues to experience pain and a lack of energy.  She has a history of chronic pain, particularly in her arms and legs, with a significant episode of pain two to three years ago initially diagnosed as a groin strain. She uses a cane during flare-ups. She has received prednisone injections in her legs, which provided temporary relief, and a Cymbicort shot that was supposed to last six months but did not provide the expected relief.  She has a history of elevated sedimentation rate (ESR) from four years ago, which was 150, indicating chronic inflammation. She also reports a  history of tick bites and chronic fatigue, with previous tests for Lyme disease returning negative results.  Her current medications include escitalopram  20 mg daily, lorazepam  twice daily, meloxicam, and supplements such as red yeast rice and niacin. She has stopped taking pravastatin  and is not currently on any blood thinners.   Pt is under the care of a psychiatric specialist who is managing most of her medications.      Outpatient Encounter Medications as of 09/22/2023  Medication Sig   B Complex-C (B-COMPLEX WITH VITAMIN C) tablet Take 1 tablet by mouth daily.   buPROPion  (WELLBUTRIN  XL) 300 MG 24 hr tablet Take 1 tablet (300 mg total) by mouth daily.   Cholecalciferol  125 MCG (5000 UT) TABS Take 1 tablet (5,000 Units total) by mouth daily.   escitalopram  (LEXAPRO ) 20 MG tablet Take 1 tablet (20 mg total) by mouth daily.   esomeprazole  (NEXIUM ) 40 MG capsule Take 1 capsule (40 mg total) by mouth daily.   fluticasone  (FLONASE ) 50 MCG/ACT nasal spray Place 1 spray into both nostrils daily.   ibuprofen  (ADVIL ) 400 MG tablet Take 1 tablet (400 mg total) by mouth every 6 (six) hours as needed (pain not relieved with tylenol ).   lamoTRIgine (LAMICTAL) 100 MG tablet Take 100 mg by mouth daily.   LORazepam  (ATIVAN ) 1 MG tablet Take 1 tablet (1 mg total) by mouth 2 (two) times daily.   losartan -hydrochlorothiazide  (HYZAAR) 50-12.5 MG tablet TAKE 1 TABLET EVERY DAY   magnesium  oxide (MAG-OX) 400 (240  Mg) MG tablet Take 400 mg by mouth daily.   Red Yeast Rice Extract (RED YEAST RICE PO) Take by mouth.   Turmeric 500 MG CAPS Take by mouth.   [DISCONTINUED] apixaban  (ELIQUIS ) 2.5 MG TABS tablet Continue Eliquis  2.5 mg by mouth twice daily through 05/28/2023 and stop   [DISCONTINUED] ferrous sulfate  325 (65 FE) MG tablet Take 1 tablet (325 mg total) by mouth daily with breakfast.   [DISCONTINUED] methocarbamol  (ROBAXIN ) 500 MG tablet Take 1 tablet (500 mg total) by mouth every 6 (six) hours as needed for  muscle spasms.   [DISCONTINUED] montelukast  (SINGULAIR ) 10 MG tablet Take 1 tablet (10 mg total) by mouth at bedtime.   [DISCONTINUED] pravastatin  (PRAVACHOL ) 20 MG tablet Take 1 tablet (20 mg total) by mouth at bedtime.   [DISCONTINUED] senna-docusate (SENOKOT-S) 8.6-50 MG tablet Take 2 tablets by mouth at bedtime.   [DISCONTINUED] thiamine  (VITAMIN B1) 100 MG tablet Take 1 tablet (100 mg total) by mouth daily.   [DISCONTINUED] traMADol  (ULTRAM ) 50 MG tablet Take 1 tablet (50 mg total) by mouth every 6 (six) hours as needed for severe pain (pain score 7-10).   No facility-administered encounter medications on file as of 09/22/2023.    Past Medical History:  Diagnosis Date   Chicken pox    Depression    Diverticulitis    Dizziness and giddiness    Dyspnea    Fatigue    GERD (gastroesophageal reflux disease)    High blood pressure    Lightheadedness    Palpitations    SOB (shortness of breath)    Weakness     Past Surgical History:  Procedure Laterality Date   APPENDECTOMY  1976   CESAREAN SECTION  1981   CESAREAN SECTION  1969   cholecystectomy  1976   ORIF HUMERUS FRACTURE Right 04/27/2023   Procedure: OPEN REDUCTION INTERNAL FIXATION (ORIF) HUMERAL SHAFT FRACTURE;  Surgeon: Laneta Pintos, MD;  Location: [facility] OR;  Service: Orthopedics;  Laterality: Right;    Family History  Problem Relation Age of Onset   Arrhythmia Mother    Heart attack Father    Cancer Father     Social History   Socioeconomic History   Marital status: Married    Spouse name: Not on file   Number of children: Not on file   Years of education: Not on file   Highest education level: Not on file  Occupational History   Not on file  Tobacco Use   Smoking status: Never   Smokeless tobacco: Never  Vaping Use   Vaping status: Never Used  Substance and Sexual Activity   Alcohol use: Yes   Drug use: Never   Sexual activity: Not Currently  Other Topics Concern   Not on file  Social History  Narrative   Not on file   Social Drivers of Health   Financial Resource Strain: Low Risk  (06/26/2023)   Received from [facility]   Overall Financial Resource Strain (CARDIA)    Difficulty of Paying Living Expenses: Not hard at all  Food Insecurity: No Food Insecurity (06/26/2023)   Received from [facility]   Hunger Vital Sign    Worried About Running Out of Food in the Last Year: Never true    Ran Out of Food in the Last Year: Never true  Transportation Needs: No Transportation Needs (06/26/2023)   Received from [facility] - Transportation    Lack of Transportation (Medical): No    Lack  of Transportation (Non-Medical): No  Physical Activity: Insufficiently Active (08/10/2022)   Received from [facility]   Exercise Vital Sign    Days of Exercise per Week: 1 day    Minutes of Exercise per Session: 20 min  Stress: Stress Concern Present (08/10/2022)   Received from [facility] - Occupational Stress Questionnaire    Feeling of Stress : To some extent  Social Connections: Moderately Integrated (08/10/2022)   Received from [facility]   Social Network    How would you rate your social network (family, work, friends)?: Adequate participation with social networks  Intimate Partner Violence: Not At Risk (04/26/2023)   Humiliation, Afraid, Rape, and Kick questionnaire    Fear of Current or Ex-Partner: No    Emotionally Abused: No    Physically Abused: No    Sexually Abused: No    ROS: as noted in HPI  Objective:  BP 120/80   Pulse 90   Ht 5\' 8"  (1.727 m)   Wt 216 lb 1.9 oz (98 kg)   SpO2 95%   BMI 32.86 kg/m   Physical Exam Vitals and nursing note reviewed.  Constitutional:      General: She is not in acute distress.    Appearance: Normal appearance. She is not ill-appearing, toxic-appearing or diaphoretic.  HENT:     Head: Normocephalic and atraumatic.     Right Ear: Tympanic membrane, ear canal and external  ear normal. There is no impacted cerumen.     Left Ear: Tympanic membrane, ear canal and external ear normal. There is no impacted cerumen.     Nose: Nose normal.     Mouth/Throat:     Mouth: Mucous membranes are moist.     Pharynx: Oropharynx is clear. No oropharyngeal exudate or posterior oropharyngeal erythema.  Eyes:     General: No scleral icterus.       Right eye: No discharge.        Left eye: No discharge.     Extraocular Movements: Extraocular movements intact.     Pupils: Pupils are equal, round, and reactive to light.  Neck:     Thyroid: No thyroid mass, thyromegaly or thyroid tenderness.  Cardiovascular:     Rate and Rhythm: Normal rate and regular rhythm.     Pulses: Normal pulses.     Heart sounds: No murmur heard. Pulmonary:     Effort: Pulmonary effort is normal. No respiratory distress.     Breath sounds: Normal breath sounds. No stridor. No wheezing or rhonchi.  Abdominal:     General: Abdomen is flat. Bowel sounds are normal. There is no distension.     Palpations: Abdomen is soft. There is no mass.     Tenderness: There is no abdominal tenderness. There is no guarding.  Musculoskeletal:     Cervical back: Normal range of motion and neck supple. No rigidity or tenderness.     Right lower leg: No edema.     Left lower leg: No edema.  Lymphadenopathy:     Cervical: No cervical adenopathy.  Skin:    General: Skin is warm and dry.     Coloration: Skin is not jaundiced.     Findings: No bruising, erythema or rash.  Neurological:     General: No focal deficit present.     Mental Status: She is alert and oriented to person, place, and time.     Sensory: No sensory deficit.     Motor:  No weakness.  Psychiatric:        Mood and Affect: Mood normal.        Behavior: Behavior normal.    Assessment & Plan:  Recurrent falls -     Magnesium   Complaints of total body pain -     CK total and CKMB (cardiac)not at [facility] -     Sedimentation rate -     C-reactive  protein -     ANA Screen,IFA,Reflex Titer/Pattern,Reflex Mplx 11 Ab Cascade with IdentRA -     CBC with Differential/Platelet -     Comprehensive metabolic panel with GFR  ESR raised -     Sedimentation rate -     C-reactive protein  Essential hypertension -     TSH  Hyperlipidemia, unspecified hyperlipidemia type -     Lipid panel  Anxiety and depression -     Comprehensive metabolic panel with GFR  Normocytic anemia -     CBC with Differential/Platelet -     Iron, TIBC and Ferritin Panel  Chronic fatigue -     CBC with Differential/Platelet -     Hemoglobin A1c -     Comprehensive metabolic panel with GFR -     Z61 and Folate Panel -     VITAMIN D  25 Hydroxy (Vit-D Deficiency, Fractures) -     Magnesium   Dizziness  Assessment and Plan    Chronic joint pain Suspected arthritis flare-up with widespread joint pain and history of elevated ESR. Possible contribution from tick bites. - Order ESR and comprehensive blood work for chronic inflammation and rheumatological conditions. - Consider rheumatology referral based on lab results.  Chronic fatigue Persistent fatigue affecting daily activities, possibly due to chronic inflammation or rheumatological condition. - Order ESR and comprehensive blood work for underlying causes.  Dizziness Intermittent dizziness, chronic. Normal neuro exam. Extensive list of differentials; Reports feeling unsafe while walking and driving. - consider imaging upon follow up - Reassess dizziness after lab review.  Depression Managed with escitalopram  and lorazepam . Concerns about side effects and dependency. Discussed transitioning to clonazepam for tapering off benzos. - Discuss lorazepam  to clonazepam transition with psychiatrist. - Continue escitalopram  20 mg daily. - stop singulair  due to bbw label  Humeral shaft fracture History of fracture with surgical intervention. No current issues.  General Health Maintenance Up to date with  flu and COVID vaccinations.  Follow-Up Follow-up planned to review lab results and reassess symptoms. - Schedule follow-up in 10 days to review lab results and reassess symptoms.        Return in about 10 days (around 10/02/2023).   Mandy Second, PA

## 2023-09-24 ENCOUNTER — Encounter: Payer: Self-pay | Admitting: Urgent Care

## 2023-09-24 ENCOUNTER — Other Ambulatory Visit: Payer: Self-pay | Admitting: Urgent Care

## 2023-09-24 DIAGNOSIS — E538 Deficiency of other specified B group vitamins: Secondary | ICD-10-CM

## 2023-09-24 DIAGNOSIS — E611 Iron deficiency: Secondary | ICD-10-CM

## 2023-09-24 MED ORDER — FERROUS FUMARATE-VITAMIN C ER 65-25 MG PO TBCR: 1.0000 | EXTENDED_RELEASE_TABLET | Freq: Every day | ORAL | 2 refills | Status: AC

## 2023-09-24 MED ORDER — B-12 1000 MCG SL SUBL: 1.0000 | SUBLINGUAL_TABLET | Freq: Every day | SUBLINGUAL | 2 refills | Status: DC

## 2023-09-24 NOTE — Addendum Note (Signed)
 Addended by: Danese Dorsainvil on: 09/24/2023 08:16 PM   Modules accepted: Orders

## 2023-09-25 ENCOUNTER — Encounter (HOSPITAL_COMMUNITY): Payer: Self-pay

## 2023-09-25 LAB — TIER 1
Chromatin (Nucleosomal) Antibody: <1 AI
ENA SM Ab Ser-aCnc: <1 AI
Ribonucleic Protein(ENA) Antibody, IgG: <1 AI
SM/RNP: <1 AI
ds DNA Ab: 3 [IU]/mL

## 2023-09-25 LAB — IRON,TIBC AND FERRITIN PANEL
%SAT: 6 % — ABNORMAL LOW (ref 16–45)
Ferritin: 7 ng/mL — ABNORMAL LOW (ref 16–288)
Iron: 27 ug/dL — ABNORMAL LOW (ref 45–160)
TIBC: 490 ug/dL — ABNORMAL HIGH (ref 250–450)

## 2023-09-25 LAB — CK TOTAL AND CKMB (NOT AT ARMC)
CK, MB: <0.7 ng/mL (ref 0–5.0)
Total CK: 57 U/L (ref 18–225)

## 2023-09-25 LAB — TIER 2
Jo-1 Autoabs: <1 AI
SSA (Ro) (ENA) Antibody, IgG: <1 AI
SSB (La) (ENA) Antibody, IgG: <1 AI
Scleroderma (Scl-70) (ENA) Antibody, IgG: <1 AI

## 2023-09-25 LAB — INTERPRETATION

## 2023-09-25 LAB — TIER 3
Centromere Ab Screen: <1 AI
Ribosomal P Protein Ab: <1 AI

## 2023-09-25 LAB — ANA SCREEN,IFA,REFLEX TITER/PATTERN,REFLEX MPLX 11 AB CASCADE
Anti Nuclear Antibody (ANA): POSITIVE — AB
Cyclic Citrullin Peptide Ab: <16 U
MUTATED CITRULLINATED VIMENTIN (MCV) AB: <20 U/mL (ref <20)
Rheumatoid fact SerPl-aCnc: <10 [IU]/mL (ref <14)

## 2023-09-25 LAB — ANTI-NUCLEAR AB-TITER (ANA TITER): ANA Titer 1: 1:40 {titer} — ABNORMAL HIGH

## 2023-10-02 ENCOUNTER — Encounter: Payer: Self-pay | Admitting: Urgent Care

## 2023-10-02 ENCOUNTER — Ambulatory Visit: Admitting: Urgent Care

## 2023-10-02 VITALS — BP 138/80 | HR 84 | Wt 215.0 lb

## 2023-10-02 DIAGNOSIS — R52 Pain, unspecified: Secondary | ICD-10-CM | POA: Diagnosis not present

## 2023-10-02 DIAGNOSIS — Z1211 Encounter for screening for malignant neoplasm of colon: Secondary | ICD-10-CM

## 2023-10-02 DIAGNOSIS — E611 Iron deficiency: Secondary | ICD-10-CM

## 2023-10-02 DIAGNOSIS — E538 Deficiency of other specified B group vitamins: Secondary | ICD-10-CM | POA: Diagnosis not present

## 2023-10-02 DIAGNOSIS — E785 Hyperlipidemia, unspecified: Secondary | ICD-10-CM | POA: Diagnosis not present

## 2023-10-02 DIAGNOSIS — R768 Other specified abnormal immunological findings in serum: Secondary | ICD-10-CM

## 2023-10-02 DIAGNOSIS — R5382 Chronic fatigue, unspecified: Secondary | ICD-10-CM

## 2023-10-02 MED ORDER — CYANOCOBALAMIN 1000 MCG/ML IJ SOLN
1000.0000 ug | Freq: Once | INTRAMUSCULAR | Status: AC
Start: 2023-10-02 — End: 2023-10-02
  Administered 2023-10-02: 1000 ug via INTRAMUSCULAR

## 2023-10-02 MED ORDER — CYANOCOBALAMIN 1000 MCG/ML IJ SOLN: 1000.0000 ug | INTRAMUSCULAR | 2 refills | Status: AC

## 2023-10-02 NOTE — Progress Notes (Signed)
 Established Patient Office Visit  Subjective:  Patient ID: Morgan Potter, female    DOB: [birth year]  Age: 73 y.o. MRN: [id number]  Chief Complaint  Patient presents with   Follow-up    10 day follow up. Pt states she has not picked up any of her prescriptions because the pharmacy has not receieved them. Pts daughter wants to see if she can get the injection instead    HPI  Discussed the use of AI scribe software for clinical note transcription with the patient, who gave verbal consent to proceed.  History of Present Illness   Morgan Potter is a 73 year old female who presents with dizziness and vitamin deficiencies.  She has experienced dizziness, which has improved since discontinuing Singulair . The dizziness was a significant issue prior to stopping the medication.  She has symptoms related to vitamin deficiencies, specifically B12 and iron. Oral B12 supplements, including gummies, have been ineffective due to absorption issues. She has tried sublingual B12 remotely but prefers injections due to past experiences of feeling unwell with oral supplements. She has a history of iron deficiency. She reports colonoscopy a few years ago, which was reportedly normal.  Her lab results indicated a positive ANA with a homogenous pattern. She has a family history of autoimmune diseases, including her daughter with thyroid  issues, and her mother and aunt who had "swollen glands". She recalls environmental exposures in childhood, such as playing in pesticide spray, which she speculates may have contributed to her family's health issues.  She has a history of black stools, which she attributes to dietary factors like eating blueberries. No use of Pepto Bismol or other medications that could cause black stools.  She experiences back pain, described as severe today, particularly after lying in bed during a rainy day. She has not taken any medication for the pain today and describes the pain as tender in  certain areas of her back.      Patient Active Problem List   Diagnosis Date Noted   Critical polytrauma 05/02/2023   Fall 04/25/2023   Normocytic anemia 04/25/2023   Refusal of blood product 04/25/2023   Fracture of humeral head, closed, left, initial encounter 04/25/2023   Eyebrow laceration, right, initial encounter 04/25/2023   Left patella fracture 04/25/2023   Closed displaced comminuted fracture of shaft of right humerus 04/25/2023   Leukocytosis 04/25/2023   Essential hypertension 04/25/2023   Anxiety and depression 04/25/2023   Hyperlipidemia 04/25/2023   Past Medical History:  Diagnosis Date   Chicken pox    Depression    Diverticulitis    Dizziness and giddiness    Dyspnea    Fatigue    GERD (gastroesophageal reflux disease)    High blood pressure    Lightheadedness    Palpitations    SOB (shortness of breath)    Weakness    Past Surgical History:  Procedure Laterality Date   APPENDECTOMY  1976   CESAREAN SECTION  1981   CESAREAN SECTION  1969   cholecystectomy  1976   ORIF HUMERUS FRACTURE Right 04/27/2023   Procedure: OPEN REDUCTION INTERNAL FIXATION (ORIF) HUMERAL SHAFT FRACTURE;  Surgeon: Laneta Pintos, MD;  Location: [facility] OR;  Service: Orthopedics;  Laterality: Right;   Social History   Socioeconomic History   Marital status: Married    Spouse name: Not on file   Number of children: Not on file   Years of education: Not on file   Highest education level: Not on file  Occupational History   Not on file  Tobacco Use   Smoking status: Never   Smokeless tobacco: Never  Vaping Use   Vaping status: Never Used  Substance and Sexual Activity   Alcohol use: Yes   Drug use: Never   Sexual activity: Not Currently  Other Topics Concern   Not on file  Social History Narrative   Not on file   Social Drivers of Health   Financial Resource Strain: Low Risk  (06/26/2023)   Received [facility]   Overall Financial Resource Strain (CARDIA)     Difficulty of Paying Living Expenses: Not hard at all  Food Insecurity: No Food Insecurity (06/26/2023)   Received from [facility]   Hunger Vital Sign    Worried About Running Out of Food in the Last Year: Never true    Ran Out of Food in the Last Year: Never true  Transportation Needs: No Transportation Needs (06/26/2023)   Received from [facility] - Transportation    Lack of Transportation (Medical): No    Lack of Transportation (Non-Medical): No  Physical Activity: Insufficiently Active (08/10/2022)   Received from [facility]   Exercise Vital Sign    Days of Exercise per Week: 1 day    Minutes of Exercise per Session: 20 min  Stress: Stress Concern Present (08/10/2022)   Received from [facility] - Occupational Stress Questionnaire    Feeling of Stress : To some extent  Social Connections: Moderately Integrated (08/10/2022)   Received from [facility]   Social Network    How would you rate your social network (family, work, friends)?: Adequate participation with social networks  Intimate Partner Violence: Not At Risk (04/26/2023)   Humiliation, Afraid, Rape, and Kick questionnaire    Fear of Current or Ex-Partner: No    Emotionally Abused: No    Physically Abused: No    Sexually Abused: No      ROS: as noted in HPI  Objective:     BP 138/80   Pulse 84   Wt 215 lb (97.5 kg)   SpO2 99%   BMI 32.69 kg/m  BP Readings from Last 3 Encounters:  10/02/23 138/80  09/22/23 120/80  05/12/23 (!) 160/76   Wt Readings from Last 3 Encounters:  10/02/23 215 lb (97.5 kg)  09/22/23 216 lb 1.9 oz (98 kg)  05/02/23 220 lb 10.9 oz (100.1 kg)      Physical Exam Vitals and nursing note reviewed.  Constitutional:      General: She is not in acute distress.    Appearance: Normal appearance. She is not ill-appearing, toxic-appearing or diaphoretic.  HENT:     Head: Normocephalic and atraumatic.     Mouth/Throat:      Mouth: Mucous membranes are moist.     Pharynx: Oropharynx is clear. No oropharyngeal exudate or posterior oropharyngeal erythema.  Eyes:     General: No scleral icterus.       Right eye: No discharge.        Left eye: No discharge.     Extraocular Movements: Extraocular movements intact.     Pupils: Pupils are equal, round, and reactive to light.  Neck:     Thyroid : No thyroid  mass, thyromegaly or thyroid  tenderness.  Cardiovascular:     Rate and Rhythm: Normal rate and regular rhythm.     Heart sounds: Normal heart sounds. No murmur heard.    No gallop.  Pulmonary:  Effort: Pulmonary effort is normal. No respiratory distress.     Breath sounds: Normal breath sounds. No stridor. No wheezing, rhonchi or rales.  Musculoskeletal:        General: Tenderness present.     Cervical back: Normal range of motion and neck supple. No rigidity or tenderness.     Comments: Midline back pain to paralumbar musculature. No spinous process tenderness No step off deformity. Neg seated SLR  Lymphadenopathy:     Cervical: No cervical adenopathy.  Skin:    General: Skin is warm and dry.     Coloration: Skin is not jaundiced.     Findings: No bruising, erythema or rash.  Neurological:     Mental Status: She is alert.      Last CBC Lab Results  Component Value Date   WBC 5.6 09/22/2023   HGB 11.0 (L) 09/22/2023   HCT 33.4 (L) 09/22/2023   MCV 90.4 09/22/2023   MCH 30.1 05/11/2023   RDW 15.3 09/22/2023   PLT 461.0 (H) 09/22/2023   Last metabolic panel Lab Results  Component Value Date   GLUCOSE 103 (H) 09/22/2023   NA 137 09/22/2023   K 4.7 09/22/2023   CL 100 09/22/2023   CO2 27 09/22/2023   BUN 18 09/22/2023   CREATININE 0.89 09/22/2023   GFR 63.50 09/22/2023   CALCIUM 9.8 09/22/2023   PROT 7.1 09/22/2023   ALBUMIN 4.5 09/22/2023   BILITOT 0.5 09/22/2023   ALKPHOS 63 09/22/2023   AST 16 09/22/2023   ALT 11 09/22/2023   ANIONGAP 11 05/11/2023      The 10-year ASCVD  risk score (Arnett DK, et al., 2019) is: 24.6%  Assessment & Plan:  B12 deficiency -     Cyanocobalamin ; Inject 1 mL (1,000 mcg total) into the muscle every 30 (thirty) days.  Dispense: 3 mL; Refill: 2 -     Cyanocobalamin   Iron deficiency -     Ambulatory referral to Gastroenterology  Complaints of total body pain -     Lupus (SLE) Analysis -     Ambulatory referral to Rheumatology  Hyperlipidemia, unspecified hyperlipidemia type  Chronic fatigue -     Lupus (SLE) Analysis -     Ambulatory referral to Rheumatology  ANA positive -     Lupus (SLE) Analysis -     Ambulatory referral to Rheumatology  Screening for colon cancer -     Ambulatory referral to Gastroenterology  Assessment and Plan    Iron deficiency anemia (D50.9) Iron deficiency anemia likely contributing to fatigue and dizziness. Postmenopausal status raises concern for gastrointestinal bleeding. Non-adherence to iron supplements may exacerbate symptoms. - Initiate ferrous fumarate  supplementation. - Schedule diagnostic colonoscopy to rule out gastrointestinal bleeding. - Reassess iron levels in 5-6 weeks.  Vitamin B12 deficiency anemia (D51.9) Vitamin B12 deficiency likely contributing to fatigue and dizziness. Pt not responding to oral supplementation. - Administer B12 injections weekly for the first month, then monthly. (Start today in office) - Provide instructions for home administration of B12 injections.  Dizziness Dizziness decreased and nearly resolved after discontinuing Singulair , suggesting medication-related cause. May also relate to iron deficiency anemia.  Positive ANA Positive ANA test with homogenous pattern suggests potential lupus. Family history of autoimmune conditions noted. Further testing required. - Order comprehensive diagnostic panel to evaluate positive ANA test. - Review diagnostic panel results upon availability. - referral to rheumatology  Back pain / total body pain New  onset back pain attributed to prolonged bed rest. NSAIDs contraindicated  due to gastrointestinal risk. - Use acetaminophen  for pain relief. - Apply over-the-counter Salonpas patch. - Avoid NSAIDs to prevent gastrointestinal irritation.  - discuss possibly switching wellbutrin  to cymbalta with psychiatrist to see if this helps with your symptoms  Hyperlipidemia ASCVD risk score 24.6%. Pt previously prescribed statins, could not tolerate. Currently on red yeast rice - consider zetia or repatha should pt opt to start tx         Return in about 4 weeks (around 10/30/2023).   Mandy Second, PA

## 2023-10-02 NOTE — Patient Instructions (Addendum)
 Your ANA test is positive. We drew more specific lab tests today to assess for lupus. I have placed a referral to rheumatology to further address your symptoms.  Please take the ferrous fumarate  daily.  We can start you on B12 injections - it will be once weekly for the first 4 weeks, then once monthly. We will recheck these levels in 3 months.  I would like you to obtain a colonoscopy given your iron deficiency. We need to rule out silent blood loss. I have placed a referral for this today. Do not take NSAIDs (ibuprofen  or advil ). For your back pain, take acetaminophen  (tylenol ) 650mg  every 6 hours as needed. Please also purchase the over the counter salonpas patch - lidocaine  4%. Place on the lower back in the morning and remove 12 hours later.  To help with your body pain - please discuss possibly changing Wellbutrin  to Cymbalta, but this would need to be discussed with your specialist.   Return for recheck in 4 weeks.

## 2023-10-05 ENCOUNTER — Ambulatory Visit: Payer: Self-pay | Admitting: Urgent Care

## 2023-10-05 ENCOUNTER — Other Ambulatory Visit: Payer: Self-pay | Admitting: Medical Genetics

## 2023-10-06 ENCOUNTER — Ambulatory Visit: Admitting: Urgent Care

## 2023-10-06 LAB — LUPUS (SLE) ANALYSIS
Anti Nuclear Antibody (ANA): NEGATIVE
Anti-striation Abs: NEGATIVE
Complement C4, Serum: 20 mg/dL (ref 12–38)
ENA RNP Ab: <0.2 AI (ref 0.0–0.9)
ENA SM Ab Ser-aCnc: <0.2 AI (ref 0.0–0.9)
ENA SSA (RO) Ab: <0.2 AI (ref 0.0–0.9)
ENA SSB (LA) Ab: <0.2 AI (ref 0.0–0.9)
Mitochondrial Ab: <20 U (ref 0.0–20.0)
Parietal Cell Ab: 0.9 U (ref 0.0–20.0)
Scleroderma (Scl-70) (ENA) Antibody, IgG: <0.2 AI (ref 0.0–0.9)
Smooth Muscle Ab: 4 U (ref 0–19)
Thyroperoxidase Ab SerPl-aCnc: 19 [IU]/mL (ref 0–34)
dsDNA Ab: 2 [IU]/mL (ref 0–9)

## 2023-10-08 ENCOUNTER — Other Ambulatory Visit

## 2023-10-13 ENCOUNTER — Other Ambulatory Visit

## 2023-10-13 DIAGNOSIS — Z006 Encounter for examination for normal comparison and control in clinical research program: Secondary | ICD-10-CM

## 2023-10-20 LAB — GENECONNECT MOLECULAR SCREEN: Genetic Analysis Overall Interpretation: NEGATIVE

## 2023-10-23 DIAGNOSIS — F331 Major depressive disorder, recurrent, moderate: Secondary | ICD-10-CM | POA: Diagnosis not present

## 2023-10-23 DIAGNOSIS — F411 Generalized anxiety disorder: Secondary | ICD-10-CM | POA: Diagnosis not present

## 2023-10-23 DIAGNOSIS — Z79899 Other long term (current) drug therapy: Secondary | ICD-10-CM | POA: Diagnosis not present

## 2023-10-28 ENCOUNTER — Encounter: Payer: Self-pay | Admitting: Urgent Care

## 2023-10-28 ENCOUNTER — Ambulatory Visit (INDEPENDENT_AMBULATORY_CARE_PROVIDER_SITE_OTHER): Admitting: Urgent Care

## 2023-10-28 VITALS — BP 123/77 | HR 95 | Wt 214.8 lb

## 2023-10-28 DIAGNOSIS — R5382 Chronic fatigue, unspecified: Secondary | ICD-10-CM | POA: Diagnosis not present

## 2023-10-28 DIAGNOSIS — R42 Dizziness and giddiness: Secondary | ICD-10-CM

## 2023-10-28 DIAGNOSIS — E785 Hyperlipidemia, unspecified: Secondary | ICD-10-CM

## 2023-10-28 DIAGNOSIS — D649 Anemia, unspecified: Secondary | ICD-10-CM

## 2023-10-28 DIAGNOSIS — F419 Anxiety disorder, unspecified: Secondary | ICD-10-CM

## 2023-10-28 DIAGNOSIS — E538 Deficiency of other specified B group vitamins: Secondary | ICD-10-CM | POA: Diagnosis not present

## 2023-10-28 DIAGNOSIS — E611 Iron deficiency: Secondary | ICD-10-CM

## 2023-10-28 DIAGNOSIS — I10 Essential (primary) hypertension: Secondary | ICD-10-CM

## 2023-10-28 DIAGNOSIS — F32A Depression, unspecified: Secondary | ICD-10-CM

## 2023-10-28 MED ORDER — EZETIMIBE 10 MG PO TABS: 10.0000 mg | ORAL_TABLET | Freq: Every day | ORAL | 3 refills | Status: AC

## 2023-10-28 NOTE — Patient Instructions (Addendum)
   Vit D3 over the counter 1,000 international units daily. Continue you B12 injections monthly.   For your cholesterol, please start taking zetia daily.   Please call the GI specialist listed below to schedule your colonoscopy:  [address], [place], [place] [address] Phone: [phone]   [facility] [address], [place], [place] [address] Phone: [phone]  Please schedule a follow up in 2 months

## 2023-10-28 NOTE — Progress Notes (Signed)
 Established Patient Office Visit  Subjective:  Patient ID: Morgan Potter, female    DOB: 02-Nov-1950  Age: 73 y.o. MRN: [id number]  Chief Complaint  Patient presents with   Medical Management of Chronic Issues    HPI  Discussed the use of AI scribe software for clinical note transcription with the patient, who gave verbal consent to proceed.  History of Present Illness   BARBARAJEAN Potter is a 73 year old female who presents for follow-up on her B12 and iron supplementation.  She has been experiencing issues with obtaining B12 injections. Initially, she received a prescription but was not provided with needles, leading her to use her daughter's needles for administration. Currently, she is receiving B12 injections at home. Her B12 levels have been significantly low, contributing to symptoms such as dizziness, pain, and fatigue, which have shown some improvement with supplementation.  She has a history of high cholesterol, with levels elevated at 250 mg/dL. She participated in a study to assess her risk of heart attack due to cholesterol levels but did not qualify because her cholesterol was not the type associated with a high risk for heart attacks. Despite having high cholesterol for many years, she has not been previously treated for it due to intolerance to statins.  She has a history of iron deficiency, with iron levels described as 'nonexistent'. She is currently taking an over-the-counter iron supplement, which she describes as a 'little packet' that disperses throughout the day. She has experienced low iron since childhood, which she believes may be inherited. Symptoms of iron deficiency, such as fatigue, have been present.  She also mentions a low vitamin D  level, which is on the lower end of normal. She is taking vitamin D  supplements to maintain normal levels. She experiences symptoms such as dizziness, pain, shortness of breath, and fatigue, which she feels have improved with current  supplementation. She attributes some of her fatigue to being 'lazy' and acknowledges the need to build up her physical activity.  There is a family history of low iron and cancer passed down through the maternal line. She is undergoing DNA testing to assess her risk for various cancers.       Patient Active Problem List   Diagnosis Date Noted   Critical polytrauma 05/02/2023   Fall 04/25/2023   Normocytic anemia 04/25/2023   Refusal of blood product 04/25/2023   Fracture of humeral head, closed, left, initial encounter 04/25/2023   Eyebrow laceration, right, initial encounter 04/25/2023   Left patella fracture 04/25/2023   Closed displaced comminuted fracture of shaft of right humerus 04/25/2023   Leukocytosis 04/25/2023   Essential hypertension 04/25/2023   Anxiety and depression 04/25/2023   Hyperlipidemia 04/25/2023   Past Medical History:  Diagnosis Date   Chicken pox    Depression    Diverticulitis    Dizziness and giddiness    Dyspnea    Fatigue    GERD (gastroesophageal reflux disease)    High blood pressure    Lightheadedness    Palpitations    SOB (shortness of breath)    Weakness    Past Surgical History:  Procedure Laterality Date   APPENDECTOMY  1976   CESAREAN SECTION  1981   CESAREAN SECTION  1969   cholecystectomy  1976   ORIF HUMERUS FRACTURE Right 04/27/2023   Procedure: OPEN REDUCTION INTERNAL FIXATION (ORIF) HUMERAL SHAFT FRACTURE;  Surgeon: Laneta Pintos, MD;  Location: [facility] OR;  Service: Orthopedics;  Laterality: Right;   Social  History   Tobacco Use   Smoking status: Never   Smokeless tobacco: Never  Vaping Use   Vaping status: Never Used  Substance Use Topics   Alcohol use: Yes   Drug use: Never      ROS: as noted in HPI  Objective:     BP 123/77   Pulse 95   Wt 214 lb 12 oz (97.4 kg)   SpO2 95%   BMI 32.65 kg/m  BP Readings from Last 3 Encounters:  10/28/23 123/77  10/02/23 138/80  09/22/23 120/80   Wt Readings from Last  3 Encounters:  10/28/23 214 lb 12 oz (97.4 kg)  10/02/23 215 lb (97.5 kg)  09/22/23 216 lb 1.9 oz (98 kg)      Physical Exam Vitals and nursing note reviewed.  Constitutional:      General: She is not in acute distress.    Appearance: Normal appearance. She is not ill-appearing, toxic-appearing or diaphoretic.  HENT:     Head: Normocephalic and atraumatic.     Mouth/Throat:     Mouth: Mucous membranes are moist.   Eyes:     General: No scleral icterus.       Right eye: No discharge.        Left eye: No discharge.     Extraocular Movements: Extraocular movements intact.     Pupils: Pupils are equal, round, and reactive to light.     Comments: Conjunctival pallor   Cardiovascular:     Rate and Rhythm: Normal rate and regular rhythm.  Pulmonary:     Effort: Pulmonary effort is normal. No respiratory distress.     Breath sounds: Normal breath sounds. No stridor. No wheezing or rhonchi.   Musculoskeletal:     Cervical back: No tenderness.  Lymphadenopathy:     Cervical: No cervical adenopathy.   Skin:    General: Skin is warm and dry.     Coloration: Skin is not jaundiced.     Findings: No bruising, erythema or rash.   Neurological:     General: No focal deficit present.     Mental Status: She is alert and oriented to person, place, and time.     Gait: Gait normal.   Psychiatric:        Mood and Affect: Mood normal.        Behavior: Behavior normal.      No results found for any visits on 10/28/23.  Last CBC Lab Results  Component Value Date   WBC 5.6 09/22/2023   HGB 11.0 (L) 09/22/2023   HCT 33.4 (L) 09/22/2023   MCV 90.4 09/22/2023   MCH 30.1 05/11/2023   RDW 15.3 09/22/2023   PLT 461.0 (H) 09/22/2023   Last metabolic panel Lab Results  Component Value Date   GLUCOSE 103 (H) 09/22/2023   NA 137 09/22/2023   K 4.7 09/22/2023   CL 100 09/22/2023   CO2 27 09/22/2023   BUN 18 09/22/2023   CREATININE 0.89 09/22/2023   GFR 63.50 09/22/2023    CALCIUM 9.8 09/22/2023   PROT 7.1 09/22/2023   ALBUMIN 4.5 09/22/2023   BILITOT 0.5 09/22/2023   ALKPHOS 63 09/22/2023   AST 16 09/22/2023   ALT 11 09/22/2023   ANIONGAP 11 05/11/2023   Last lipids Lab Results  Component Value Date   CHOL 249 (H) 09/22/2023   HDL 71.70 09/22/2023   LDLCALC 157 (H) 09/22/2023   TRIG 102.0 09/22/2023   CHOLHDL 3 09/22/2023   Last hemoglobin A1c  Lab Results  Component Value Date   HGBA1C 6.3 09/22/2023   Last thyroid  functions Lab Results  Component Value Date   TSH 1.60 09/22/2023   Last vitamin D  Lab Results  Component Value Date   VD25OH 37.46 09/22/2023   Last vitamin B12 and Folate Lab Results  Component Value Date   VITAMINB12 205 (L) 09/22/2023   FOLATE 16.8 09/22/2023      The 10-year ASCVD risk score (Arnett DK, et al., 2019) is: 20.1%  Assessment & Plan:  B12 deficiency  Iron deficiency  Hyperlipidemia, unspecified hyperlipidemia type -     Ezetimibe ; Take 1 tablet (10 mg total) by mouth daily.  Dispense: 90 tablet; Refill: 3  Chronic fatigue  Essential hypertension  Anxiety and depression  Dizziness  Normocytic anemia  Assessment and Plan    Iron deficiency Iron levels nonexistent, indicating deficiency. Pale conjunctiva suggests ongoing deficiency. Current supplements may be ineffective. - Recommend Vitron C for iron supplementation. - Monitor conjunctiva for improvement. - Refer to GI specialist for evaluation of potential causes, including colonoscopy.  Vitamin B12 deficiency Vitamin B12 levels nonexistent, indicating deficiency. Symptoms improved with B12 injections, but there was an issue with obtaining needles. - Send injection supplies for B12 administration. - Continue B12 injections for three months. - Recheck B12 levels in August.  Hyperlipidemia Cholesterol levels elevated with total cholesterol of 250 mg/dL. LDL high, HDL at goal. 20% cardiovascular risk in 10 years without treatment.  Concerns about statin-associated muscle pain. - Prescribe Zetia  for cholesterol management. - Discuss potential side effects of statins and alternative treatments. - Evaluate cholesterol levels and risk factors in future visits.  Vitamin D  deficiency Vitamin D  levels at lower limits of normal. - Continue over-the-counter vitamin D  supplementation.     Prior dizziness, SOB and anxiety has improved significantly with vitamin supplementation. Again strongly encouraged pt to follow up with GI for colonoscopy given new onset iron deficiency.    Return in about 2 months (around 12/28/2023).   Mandy Second, PA

## 2023-11-26 ENCOUNTER — Encounter: Payer: Self-pay | Admitting: Gastroenterology

## 2023-11-26 ENCOUNTER — Encounter: Payer: Self-pay | Admitting: Urgent Care

## 2023-12-11 ENCOUNTER — Encounter: Payer: Self-pay | Admitting: Gastroenterology

## 2023-12-22 NOTE — Progress Notes (Signed)
 Psychiatry Outpatient Return   Date of Service: 12/25/2023  Chief Complaint / Purpose: Medication Management Follow-up History From: patient and medical record  Location Information: Patient State (at time of visit): [place]  Patient Location (at time of visit):Home/Other Non-Medical  Provider Location: Non-Provider-Based Clinic (Clinic, non-hospital) Is provider licensed to provide clinical care in the current location/state of the patient? Yes   Consent:  Patient's identity was confirmed. Presenting condition or illness was discussed with the patient/personal representative. Current proposed treatment for presenting condition or illness was explained to patient/personal representative along with the likely benefits and any significant risks or complications associated with the provision of treatment by audio/video means. The patient/personal representative verbally authorized treatment to be provided by audio/video, which may include a limited review of patient's current health status, medication, or other treatment recommendations, patient education, and an opportunity to ask questions about condition and treatment. Verbal Consent Granted by Patient/Personal Representative:Yes   Visit Information: Modality: 2-Way Real-Time Audio/Video  Video Start Time: 11:02 AM Video Stop Time: 11:22 AM Video Total Time: 20 minutes  Subjective/Interval History   Morgan Potter is a 73 y.o. female with a past psychiatric history of anxiety and depression who presents today for a psychiatric follow up appointment. The patient was last seen on 10/23/2023, at which time the following plan was agreed upon: - Continue Lexapro  20 mg daily - Continue Wellbutrin  XL 300 mg daily  - Continue Ativan  1 mg BID PRN for anxiety  - Continue Lamictal 100 mg daily - Continue trazodone 50 mg nightly for insomnia  - Start Buspar 5 mg BID for anxiety   Pt message 09/07/23: Lately, I have been experiencing  a little wobbling when I stand. Not so much dizziness, but unsteady when I am standing. I just felt this way since our last visit. Do you think any of the medicine I take could be causing these problems? Also, feel like I am a little silly? I have never felt this way in my whole life. I was hoping it is something I am taking  Suggested she could reduce the Lamictal to 50 mg daily if unsteadiness continues.  _______________________________________________________________  Patient says her sleep is better with 1000 mg of Mg, 1 Ca, Ativan , trazodone, 40 mg of melatonin, and Buspar nightly. She is continuing to follow up with her PCP about her anemia and low B12. She plans to discuss all of her medications with her PCP at her next appointment. She fell about 1 week ago and she is still having pain from this. Encouraged her to discuss this with her PCP and consider going to urgent care for this pain if it continues or worsens. She also plans to talk to her PCP about PT to build her strength back up. She continues to feel dizzy at times, but she does not feel this does not correlate with a previous medication change. Encouraged her to continue to monitor this and reach out with any concerns. Her anxiety is improved with the Buspar. She continues to take Ativan  BID for her anxiety. She still stays in her home often and she knows that it would be good for her to get out of the house. She plans to increase her socialization to further help her mood. She has changed her diet to include healthier foods and she has lost some weight. Discussed the option to continue her current regimen versus increasing the Buspar for her anxiety. She would like to continue her current regimen as she feels  her mood and anxiety are manageable today.   Suicidal Ideation: Denies Homicidal Ideation: Denies  PHQ-9   Today's PHQ-2 results:   Today's PHQ-9 result:   PHQ-9 Question # 9   Interpretation:          04/07/2023    09:57  PHQ9  PHQ 13   Review of Systems   Depression Symptoms: Patient reports no symptoms of depression. Anxiety Symptoms: Patient reports no symptoms of anxiety. Psychosis Symptoms: Patient reports no symptoms of psychosis. Mania Symptoms: Patient reports no symptoms of mania. Trauma-Related Symptoms: Patient reports no symptoms of PTSD.  Review of Systems  Psychiatric/Behavioral:  Negative for agitation, behavioral problems, confusion, decreased concentration, dysphoric mood, hallucinations, self-injury, sleep disturbance and suicidal ideas. The patient is not nervous/anxious and is not hyperactive.   All other systems reviewed and are negative.   Medications and Allergies   Meds Ordered in [facility]  Medication Sig Dispense Refill  . azelastine (ASTELIN) 137 mcg (0.1 %) nasal spray Administer 1 spray into each nostril 2 (two) times a day.    . buPROPion  (WELLBUTRIN  XL) 300 mg 24 hr tablet Take 1 tablet (300 mg total) by mouth daily. 90 tablet 3  . busPIRone (BUSPAR) 5 mg tablet Take 1 tablet (5 mg total) by mouth 2 (two) times a day. 180 tablet 1  . diclofenac  (VOLTAREN ) 75 mg EC tablet Take 75 mg by mouth 2 (two) times a day.    . esomeprazole  (NexIUM ) 40 mg DR capsule Take 40 mg by mouth daily.    SABRA lamoTRIgine (LaMICtal) 100 mg tablet Take 1 tablet (100 mg total) by mouth daily. 90 tablet 3  . LORazepam  (ATIVAN ) 1 mg tablet Take 1 tablet (1 mg total) by mouth 2 (two) times a day as needed for anxiety. 180 tablet 0  . losartan -hydroCHLOROthiazide  (HYZAAR) 50-12.5 mg per tablet Take 1 tablet by mouth daily.    . montelukast  (SINGULAIR ) 10 mg tablet Take 1 tablet by mouth nightly.    . pravastatin  (PRAVACHOL ) 20 mg tablet Take 20 mg by mouth nightly.    . traZODone (DESYREL) 50 mg tablet Take 1 tablet (50 mg total) by mouth at bedtime. 90 tablet 3   No current Epic-ordered facility-administered medications on file.    Not on File  Brief Psychiatric History   Suicide  Attempt History: Denies  Current psychiatric provider: Previously seeing another therapist in [place] but became cost-prohibitive Psychotherapy: None currently  Previous psychiatric medication trials: Ambien, Prozac (worked well for a while and then stopped working) unsure of other medications she has tried She has tried stimulants for ADHD but this did not help her symptoms  Psychiatric hospitalizations: 1x at [facility] for 3 weeks for severe depression   Previous ECT: Denies but mother had ECT   Family psychiatric history: Reported history of depression in mother, 3 aunts, daughter.  Son is disabled d/t mental illness but she is not sure what his diagnosis is; has substance use disorder and currently in prison; Darden is also in jail    Family history of suicide? Son attempted suicide via overdose in jail; remembers her mother being suicidal and admitted to [facility]  Pertinent Social History   Living with daughter, 4 great-granddaughters; cats and dogs  Widowed in January 2024 single, 1 daughter, 1 son (in prison)  retired; previously worked for [employer]   History of trauma/abuse: Father was absent and alcoholic Access to firearms or deadly weapons: Denies  Alcohol: she has cut back recently; she will  drink 1 beer some nights Denies any other substance use   Objective   Vitals:  There were no vitals filed for this visit.  Mental Status Examination: General Appearance normal body habitus, casually dressed, and hygiene appropriate  General Behavior cooperative, pleasant, and appropriate eye contact  Psychomotor Activity normoactive  Gait and Station not assessed  Speech   fluent, normal volume, normal tone, normal prosody, and pressured  Mood   better  Affect    anxious  Thought Process Overall linear and less circumstantial than previous visit  Associations intact  Thought Content/Perceptual Disturbances Denies suicidal/homicidal ideation and auditory/visual  hallucinations.  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 No pertinent past medical history.   Patient seen and examined as above. Briefly, patient reports improvement in her anxiety with Buspar BID and this is apparent during today's visit. She is more focused, has less pressured speech, and appears less anxious. Ativan  BID remains helpful for her anxiety and she understands the risks of this medication. Her sleep is improved with trazodone, Ativan , Buspar, Mg, Ca, and melatonin nightly. She feels that her depression and anxiety are well controlled today. She is tolerating her medications well and she does not feel a change is needed today so will continue as below. It seems that her distractibility and pressured speech were likely d/t anxiety given improvement with Buspar, but will continue to monitor for bipolar affective disorder or hyperactivity and inattention d/t ADHD. Could consider ADHD medication if inattention and hyperactivity remain despite treatment of her anxiety, but patient's symptoms have previously not improved with an unknown ADHD medication. She has reported that the Wellbutrin  remains the medication that has improved her mood the most. There are no identified acute safety concerns. Continue outpatient level of care.   Plan   Medications:  - Continue Lexapro  20 mg daily - Continue Wellbutrin  XL 300 mg daily  - Continue Ativan  1 mg BID PRN for anxiety  - Continue Lamictal 100 mg daily - Continue trazodone 50 mg nightly for insomnia  - Continue Buspar 5 mg BID for anxiety    Prescription provided for Buspar, Lamcital, and trazodone.  Labs: -  09/22/23: low iron, percent saturation, and ferritin and elevated TIBC on anemia profile   Other:  -  Go to ED with emergent symptoms or safety concerns. -  Crisis plan reviewed and patient verbally contracts for safety. -  Risks, benefits, side effects of medications, including any / all black box warnings, discussed with patient, who verbalizes their understanding.  -  Patient has participated in the development of this treatment plan and verbalized agreement with plan as listed.    Follow Up: Return in 8 weeks - Call in the interim for any side-effects, decompensation, questions, or problems between now and the next visit.   Kayla M Peksenar, PA-C [facility] Department of Psychiatry & Behavioral Health

## 2023-12-28 ENCOUNTER — Ambulatory Visit: Payer: Self-pay | Admitting: Urgent Care

## 2023-12-28 ENCOUNTER — Encounter: Payer: Self-pay | Admitting: Urgent Care

## 2023-12-28 ENCOUNTER — Ambulatory Visit

## 2023-12-28 ENCOUNTER — Ambulatory Visit (INDEPENDENT_AMBULATORY_CARE_PROVIDER_SITE_OTHER): Admitting: Urgent Care

## 2023-12-28 VITALS — BP 133/101 | HR 100 | Resp 18 | Ht 68.0 in | Wt 212.5 lb

## 2023-12-28 DIAGNOSIS — E538 Deficiency of other specified B group vitamins: Secondary | ICD-10-CM

## 2023-12-28 DIAGNOSIS — W19XXXA Unspecified fall, initial encounter: Secondary | ICD-10-CM

## 2023-12-28 DIAGNOSIS — I10 Essential (primary) hypertension: Secondary | ICD-10-CM

## 2023-12-28 DIAGNOSIS — R7303 Prediabetes: Secondary | ICD-10-CM | POA: Diagnosis not present

## 2023-12-28 DIAGNOSIS — E785 Hyperlipidemia, unspecified: Secondary | ICD-10-CM

## 2023-12-28 DIAGNOSIS — M545 Low back pain, unspecified: Secondary | ICD-10-CM

## 2023-12-28 DIAGNOSIS — R52 Pain, unspecified: Secondary | ICD-10-CM

## 2023-12-28 DIAGNOSIS — R296 Repeated falls: Secondary | ICD-10-CM | POA: Diagnosis not present

## 2023-12-28 DIAGNOSIS — S32020A Wedge compression fracture of second lumbar vertebra, initial encounter for closed fracture: Secondary | ICD-10-CM

## 2023-12-28 DIAGNOSIS — E611 Iron deficiency: Secondary | ICD-10-CM

## 2023-12-28 MED ORDER — LOSARTAN POTASSIUM-HCTZ 50-12.5 MG PO TABS: 1.0000 | ORAL_TABLET | Freq: Every day | ORAL | 1 refills | Status: DC

## 2023-12-28 NOTE — Progress Notes (Signed)
 Established Patient Office Visit  Subjective:  Patient ID: Morgan Potter, female    DOB: Aug 07, 1950  Age: 73 y.o. MRN: [id number]  Chief Complaint  Patient presents with   Sinusitis    2 month anemia f/u    HPI  Discussed the use of AI scribe software for clinical note transcription with the patient, who Potter verbal consent to proceed.  History of Present Illness   Morgan Potter is a 73 year old female who presents with back and wrist pain following a fall.  Approximately two weeks ago, she experienced a fall while managing her dogs, leading to back and wrist pain. She fell flat on her back and landed on her hand, with pain localized to the middle of her back and her wrist. The fall has significantly impacted her daily activities, preventing her from taking showers and performing tasks like putting on a bra. She has been wearing a brace on her hand, which she believes might be sprained. Her back pain initially prevented her from getting up after the fall.  Her wrist pain occurs primarily with movement, and there is yellow bruising observed. Despite the pain, she is able to move her wrist up and down. No severe pain with finger movement, but wrist pain is noted with movement, and yellow bruising is present on the hand.  She mentions a weight loss of four pounds over the past few months, with her current weight at 212 pounds. She attributes some of her stomach discomfort to iron supplements, which she takes with food. She continues to receive B12 injections, although it has been over a month since her last one.  Her family history includes a cousin with low iron requiring infusions, and she has a history of iron deficiency anemia. She is scheduled for a colonoscopy to investigate the cause of her low iron levels. She is concerned about losing muscle mass and wants to engage in physical therapy to improve her balance and strength.  Her current medications include losartan  and Zetia  for  cholesterol management. She also takes over-the-counter iron supplements and B12 injections.       Patient Active Problem List   Diagnosis Date Noted   Critical polytrauma 05/02/2023   Fall 04/25/2023   Normocytic anemia 04/25/2023   Refusal of blood product 04/25/2023   Fracture of humeral head, closed, left, initial encounter 04/25/2023   Eyebrow laceration, right, initial encounter 04/25/2023   Left patella fracture 04/25/2023   Closed displaced comminuted fracture of shaft of right humerus 04/25/2023   Leukocytosis 04/25/2023   Essential hypertension 04/25/2023   Anxiety and depression 04/25/2023   Hyperlipidemia 04/25/2023   Past Medical History:  Diagnosis Date   Chicken pox    Depression    Diverticulitis    Dizziness and giddiness    Dyspnea    Fatigue    GERD (gastroesophageal reflux disease)    High blood pressure    Lightheadedness    Palpitations    SOB (shortness of breath)    Weakness    Past Surgical History:  Procedure Laterality Date   APPENDECTOMY  1976   CESAREAN SECTION  1981   CESAREAN SECTION  1969   cholecystectomy  1976   ORIF HUMERUS FRACTURE Right 04/27/2023   Procedure: OPEN REDUCTION INTERNAL FIXATION (ORIF) HUMERAL SHAFT FRACTURE;  Surgeon: Kendal Franky SQUIBB, MD;  Location: [facility] OR;  Service: Orthopedics;  Laterality: Right;   Social History   Tobacco Use   Smoking status: Never  Smokeless tobacco: Never  Vaping Use   Vaping status: Never Used  Substance Use Topics   Alcohol use: Yes   Drug use: Never      ROS: as noted in HPI  Objective:     BP (!) 133/101   Pulse 100   Resp 18   Ht 5' 8 (1.727 m)   Wt 212 lb 8 oz (96.4 kg)   SpO2 96%   BMI 32.31 kg/m  BP Readings from Last 3 Encounters:  12/28/23 (!) 133/101  10/28/23 123/77  10/02/23 138/80   Wt Readings from Last 3 Encounters:  12/28/23 212 lb 8 oz (96.4 kg)  10/28/23 214 lb 12 oz (97.4 kg)  10/02/23 215 lb (97.5 kg)      Physical Exam Vitals and nursing  note reviewed.  Constitutional:      General: She is not in acute distress.    Appearance: Normal appearance. She is not ill-appearing, toxic-appearing or diaphoretic.  HENT:     Head: Normocephalic and atraumatic.     Mouth/Throat:     Mouth: Mucous membranes are moist.  Eyes:     General: No scleral icterus.       Right eye: No discharge.        Left eye: No discharge.     Extraocular Movements: Extraocular movements intact.     Pupils: Pupils are equal, round, and reactive to light.  Cardiovascular:     Rate and Rhythm: Normal rate and regular rhythm.  Pulmonary:     Effort: Pulmonary effort is normal. No respiratory distress.     Breath sounds: Normal breath sounds. No stridor. No wheezing or rhonchi.  Musculoskeletal:        General: Tenderness present.     Cervical back: No tenderness.     Comments: Lower back midline  Lymphadenopathy:     Cervical: No cervical adenopathy.  Skin:    General: Skin is warm and dry.     Coloration: Skin is not jaundiced.     Findings: No bruising, erythema or rash.  Neurological:     General: No focal deficit present.     Mental Status: She is alert and oriented to person, place, and time.     Gait: Gait normal.  Psychiatric:        Mood and Affect: Mood normal.        Behavior: Behavior normal.      No results found for any visits on 12/28/23.  Last CBC Lab Results  Component Value Date   WBC 5.6 09/22/2023   HGB 11.0 (L) 09/22/2023   HCT 33.4 (L) 09/22/2023   MCV 90.4 09/22/2023   MCH 30.1 05/11/2023   RDW 15.3 09/22/2023   PLT 461.0 (H) 09/22/2023   Last metabolic panel Lab Results  Component Value Date   GLUCOSE 103 (H) 09/22/2023   NA 137 09/22/2023   K 4.7 09/22/2023   CL 100 09/22/2023   CO2 27 09/22/2023   BUN 18 09/22/2023   CREATININE 0.89 09/22/2023   GFR 63.50 09/22/2023   CALCIUM 9.8 09/22/2023   PROT 7.1 09/22/2023   ALBUMIN 4.5 09/22/2023   BILITOT 0.5 09/22/2023   ALKPHOS 63 09/22/2023   AST 16  09/22/2023   ALT 11 09/22/2023   ANIONGAP 11 05/11/2023   Last lipids Lab Results  Component Value Date   CHOL 249 (H) 09/22/2023   HDL 71.70 09/22/2023   LDLCALC 157 (H) 09/22/2023   TRIG 102.0 09/22/2023   CHOLHDL 3  09/22/2023   Last hemoglobin A1c Lab Results  Component Value Date   HGBA1C 6.3 09/22/2023   Last thyroid  functions Lab Results  Component Value Date   TSH 1.60 09/22/2023   THYROIDAB 19 10/02/2023   Last vitamin D  Lab Results  Component Value Date   VD25OH 37.46 09/22/2023   Last vitamin B12 and Folate Lab Results  Component Value Date   VITAMINB12 205 (L) 09/22/2023   FOLATE 16.8 09/22/2023      The 10-year ASCVD risk score (Arnett DK, et al., 2019) is: 23.1%  Assessment & Plan:  Fall, initial encounter -     DG Lumbar Spine 2-3 Views; Future  B12 deficiency -     B12 and Folate Panel  Iron deficiency -     Iron, TIBC and Ferritin Panel -     CBC with Differential/Platelet  Hyperlipidemia, unspecified hyperlipidemia type -     Lipid panel  Recurrent falls -     Ambulatory referral to Physical Therapy -     Ambulatory referral to Physical Therapy -     DG Lumbar Spine 2-3 Views; Future  Prediabetes -     Hemoglobin A1c  Complaints of total body pain -     Ambulatory referral to Physical Therapy -     Ambulatory referral to Physical Therapy  Lumbar pain -     DG Lumbar Spine 2-3 Views; Future  Essential hypertension -     Losartan  Potassium-HCTZ; Take 1 tablet by mouth daily.  Dispense: 90 tablet; Refill: 1  Assessment and Plan    Recurrent falls with gait instability Recurrent falls and gait instability are concerning. Weight loss not significant. Possible contribution from iron deficiency anemia. - Refer to [facility] for gait stability and fall prevention. - Discussed aquatic therapy for movement and pain prevention. - Order blood work to evaluate iron deficiency anemia.  Iron deficiency anemia May  contribute to fatigue and falls. Current iron supplement causes gastrointestinal discomfort. - Check iron levels with blood work. - Advise taking iron supplements with food to reduce gastrointestinal discomfort. - Emphasize importance of attending colonoscopy to rule out gastrointestinal causes of anemia.  Right hand ligamentous strain Pain exacerbated by movement, likely ligamentous strain. Brace use appropriate. - Continue using brace for right hand.  Mid-back pain after fall - Order x-ray of Lspine to assess for fractures or significant injuries.  Vitamin B12 deficiency Difficulty obtaining needles for injections. Considering sublingual tablets. - Switch to daily sublingual vitamin B12 tablets (1000 mcg).  Hypertension On losartan . Issues with medication refill process. - Refill losartan  prescription. - Educate on using MyChart for medication refill requests.  Hyperlipidemia On Zetia . Lipid panel needed to assess medication effectiveness. - Order lipid panel to evaluate effectiveness of Zetia .         Return in about 4 months (around 04/28/2024).   Morgan LITTIE Gave, PA

## 2023-12-28 NOTE — Patient Instructions (Addendum)
 [facility] [address] [place], [place] [address] Phone: [phone]   [facility] [address] [place], [place] [address] Phone: [phone]   Start taking OVER THE COUNTER SUBLINGUAL (dissolves under your tongue) B12 1,000mcg daily. Stop the injections.  Keep your appointment with GI this week.  Go to [address] to obtain an xray of your back.  Follow up in 4 months, sooner as needed

## 2023-12-29 LAB — HEMOGLOBIN A1C
Est. average glucose Bld gHb Est-mCnc: 108 mg/dL
Hgb A1c MFr Bld: 5.4 % (ref 4.8–5.6)

## 2023-12-29 LAB — CBC WITH DIFFERENTIAL/PLATELET
Basophils Absolute: 0.1 x10E3/uL (ref 0.0–0.2)
Basos: 1 %
EOS (ABSOLUTE): 0.2 x10E3/uL (ref 0.0–0.4)
Eos: 2 %
Hematocrit: 37.1 % (ref 34.0–46.6)
Hemoglobin: 11.6 g/dL (ref 11.1–15.9)
Immature Grans (Abs): 0 x10E3/uL (ref 0.0–0.1)
Immature Granulocytes: 0 %
Lymphocytes Absolute: 1.7 x10E3/uL (ref 0.7–3.1)
Lymphs: 25 %
MCH: 28.6 pg (ref 26.6–33.0)
MCHC: 31.3 g/dL — ABNORMAL LOW (ref 31.5–35.7)
MCV: 91 fL (ref 79–97)
Monocytes Absolute: 0.7 x10E3/uL (ref 0.1–0.9)
Monocytes: 10 %
Neutrophils Absolute: 4.1 x10E3/uL (ref 1.4–7.0)
Neutrophils: 62 %
Platelets: 433 x10E3/uL (ref 150–450)
RBC: 4.06 x10E6/uL (ref 3.77–5.28)
RDW: 14.4 % (ref 11.7–15.4)
WBC: 6.7 x10E3/uL (ref 3.4–10.8)

## 2023-12-29 LAB — LIPID PANEL
Chol/HDL Ratio: 2.5 ratio (ref 0.0–4.4)
Cholesterol, Total: 200 mg/dL — ABNORMAL HIGH (ref 100–199)
HDL: 79 mg/dL (ref >39)
LDL Chol Calc (NIH): 105 mg/dL — ABNORMAL HIGH (ref 0–99)
Triglycerides: 89 mg/dL (ref 0–149)
VLDL Cholesterol Cal: 16 mg/dL (ref 5–40)

## 2023-12-29 LAB — IRON,TIBC AND FERRITIN PANEL
Ferritin: 89 ng/mL (ref 15–150)
Iron Saturation: 18 % (ref 15–55)
Iron: 76 ug/dL (ref 27–139)
Total Iron Binding Capacity: 423 ug/dL (ref 250–450)
UIBC: 347 ug/dL (ref 118–369)

## 2023-12-29 LAB — B12 AND FOLATE PANEL
Folate: 11.6 ng/mL (ref >3.0)
Vitamin B-12: 510 pg/mL (ref 232–1245)

## 2023-12-30 ENCOUNTER — Ambulatory Visit: Admitting: Gastroenterology

## 2023-12-30 ENCOUNTER — Telehealth: Payer: Self-pay

## 2023-12-30 DIAGNOSIS — S32020A Wedge compression fracture of second lumbar vertebra, initial encounter for closed fracture: Secondary | ICD-10-CM

## 2023-12-30 NOTE — Telephone Encounter (Signed)
 Patient scheduled for one month f/u with Whitney crain and given number to call to schedule Bone Density With [facility] imaging.

## 2023-12-30 NOTE — Telephone Encounter (Signed)
 Copied from CRM [id number]. Topic: Clinical - Lab/Test Results >> Dec 30, 2023  1:09 PM Farrel B wrote: Reason for CRM: Please call patient at [phone] Morgan Potter and explain the nature of the next steps with imaging I advised the patient that it does show that Dr. Lowella had placed an order for bone density test however, she stated the provider also advised her of an xray, she stated she wanted to know if she had to do bone density and xray I advised the patient what I saw in the system was bone density she has requested a callback at the number listed above.

## 2023-12-31 MED ORDER — DICLOFENAC SODIUM 75 MG PO TBEC: 75.0000 mg | DELAYED_RELEASE_TABLET | Freq: Two times a day (BID) | ORAL | 0 refills | Status: DC

## 2023-12-31 MED ORDER — HYDROCODONE-ACETAMINOPHEN 5-325 MG PO TABS: 1.0000 | ORAL_TABLET | Freq: Four times a day (QID) | ORAL | 0 refills | Status: AC | PRN

## 2024-01-28 ENCOUNTER — Ambulatory Visit: Payer: Self-pay | Admitting: Urgent Care

## 2024-01-28 ENCOUNTER — Ambulatory Visit (INDEPENDENT_AMBULATORY_CARE_PROVIDER_SITE_OTHER)

## 2024-01-28 ENCOUNTER — Ambulatory Visit: Admitting: Urgent Care

## 2024-01-28 VITALS — BP 104/69 | HR 100 | Temp 98.8°F | Ht 68.0 in | Wt 210.0 lb

## 2024-01-28 DIAGNOSIS — M545 Low back pain, unspecified: Secondary | ICD-10-CM

## 2024-01-28 DIAGNOSIS — R509 Fever, unspecified: Secondary | ICD-10-CM

## 2024-01-28 DIAGNOSIS — R197 Diarrhea, unspecified: Secondary | ICD-10-CM

## 2024-01-28 DIAGNOSIS — S32020A Wedge compression fracture of second lumbar vertebra, initial encounter for closed fracture: Secondary | ICD-10-CM

## 2024-01-28 DIAGNOSIS — M25532 Pain in left wrist: Secondary | ICD-10-CM

## 2024-01-28 DIAGNOSIS — W19XXXA Unspecified fall, initial encounter: Secondary | ICD-10-CM

## 2024-01-28 DIAGNOSIS — S62102A Fracture of unspecified carpal bone, left wrist, initial encounter for closed fracture: Secondary | ICD-10-CM

## 2024-01-28 NOTE — Patient Instructions (Signed)
 Your covid and flu test are negative. Please complete the stool culture kit as ordered.  Please go to [address] to complete your L spine and wrist xrays.  Follow up with me in 4 months, sooner as needed

## 2024-01-28 NOTE — Progress Notes (Unsigned)
 [id number]  Established Patient Office Visit  Subjective:  Patient ID: Morgan Potter, female    DOB: 1951/04/04  Age: 73 y.o. MRN: [id number]  Chief Complaint  Patient presents with  . Follow-up    Lumbar fracture and cholesterol (fasting)  . Abdominal Pain    Diarrhea, fever, gas    HPI  Patient Active Problem List   Diagnosis Date Noted  . Critical polytrauma 05/02/2023  . Fall 04/25/2023  . Normocytic anemia 04/25/2023  . Refusal of blood product 04/25/2023  . Fracture of humeral head, closed, left, initial encounter 04/25/2023  . Eyebrow laceration, right, initial encounter 04/25/2023  . Left patella fracture 04/25/2023  . Closed displaced comminuted fracture of shaft of right humerus 04/25/2023  . Leukocytosis 04/25/2023  . Essential hypertension 04/25/2023  . Anxiety and depression 04/25/2023  . Hyperlipidemia 04/25/2023   Past Medical History:  Diagnosis Date  . Chicken pox   . Depression   . Diverticulitis   . Dizziness and giddiness   . Dyspnea   . Fatigue   . GERD (gastroesophageal reflux disease)   . High blood pressure   . Lightheadedness   . Palpitations   . SOB (shortness of breath)   . Weakness    Past Surgical History:  Procedure Laterality Date  . APPENDECTOMY  1976  . CESAREAN SECTION  1981  . CESAREAN SECTION  1969  . cholecystectomy  1976  . ORIF HUMERUS FRACTURE Right 04/27/2023   Procedure: OPEN REDUCTION INTERNAL FIXATION (ORIF) HUMERAL SHAFT FRACTURE;  Surgeon: Kendal Franky SQUIBB, MD;  Location: [facility] OR;  Service: Orthopedics;  Laterality: Right;   Social History   Tobacco Use  . Smoking status: Never  . Smokeless tobacco: Never  Vaping Use  . Vaping status: Never Used  Substance Use Topics  . Alcohol use: Yes  . Drug use: Never      ROS: as noted in HPI  Objective:     BP 104/69   Pulse 100   Temp 98.8 F (37.1 C) (Oral)   Ht 5' 8 (1.727 m)   Wt 210 lb (95.3 kg)   SpO2 95%   BMI 31.93 kg/m  BP Readings from Last 3  Encounters:  01/28/24 104/69  12/28/23 (!) 133/101  10/28/23 123/77   Wt Readings from Last 3 Encounters:  01/28/24 210 lb (95.3 kg)  12/28/23 212 lb 8 oz (96.4 kg)  10/28/23 214 lb 12 oz (97.4 kg)      Physical Exam   No results found for any visits on 01/28/24.  Last CBC Lab Results  Component Value Date   WBC 6.7 12/28/2023   HGB 11.6 12/28/2023   HCT 37.1 12/28/2023   MCV 91 12/28/2023   MCH 28.6 12/28/2023   RDW 14.4 12/28/2023   PLT 433 12/28/2023   Last metabolic panel Lab Results  Component Value Date   GLUCOSE 103 (H) 09/22/2023   NA 137 09/22/2023   K 4.7 09/22/2023   CL 100 09/22/2023   CO2 27 09/22/2023   BUN 18 09/22/2023   CREATININE 0.89 09/22/2023   GFR 63.50 09/22/2023   CALCIUM 9.8 09/22/2023   PROT 7.1 09/22/2023   ALBUMIN 4.5 09/22/2023   BILITOT 0.5 09/22/2023   ALKPHOS 63 09/22/2023   AST 16 09/22/2023   ALT 11 09/22/2023   ANIONGAP 11 05/11/2023      The 10-year ASCVD risk score (Arnett DK, et al., 2019) is: 14.5%  Assessment & Plan:  Compression fracture of L2 lumbar vertebra,  closed, initial encounter [facility]) -     DG Lumbar Spine 2-3 Views; Future  Left wrist pain -     DG Wrist Complete Left; Future  Diarrhea, unspecified type -     GI Profile, Stool, PCR -     Clostridium Difficile by PCR -     Ova and parasite examination  Fever, unspecified fever cause     No follow-ups on file.   Benton LITTIE Gave, PA

## 2024-01-29 ENCOUNTER — Encounter: Payer: Self-pay | Admitting: Urgent Care

## 2024-01-29 LAB — POCT INFLUENZA A/B
Influenza A, POC: NEGATIVE
Influenza B, POC: NEGATIVE

## 2024-01-29 LAB — POC COVID19 BINAXNOW: SARS Coronavirus 2 Ag: NEGATIVE

## 2024-02-01 ENCOUNTER — Encounter (INDEPENDENT_AMBULATORY_CARE_PROVIDER_SITE_OTHER): Payer: Self-pay

## 2024-02-01 ENCOUNTER — Emergency Department (HOSPITAL_BASED_OUTPATIENT_CLINIC_OR_DEPARTMENT_OTHER)

## 2024-02-01 ENCOUNTER — Encounter (HOSPITAL_BASED_OUTPATIENT_CLINIC_OR_DEPARTMENT_OTHER): Payer: Self-pay | Admitting: *Deleted

## 2024-02-01 ENCOUNTER — Other Ambulatory Visit: Payer: Self-pay

## 2024-02-01 ENCOUNTER — Emergency Department (HOSPITAL_BASED_OUTPATIENT_CLINIC_OR_DEPARTMENT_OTHER)
Admission: EM | Admit: 2024-02-01 | Discharge: 2024-02-01 | Disposition: A | Discharge status: Home / Self Care | Source: Ambulatory Visit | Attending: Emergency Medicine | Admitting: Emergency Medicine

## 2024-02-01 ENCOUNTER — Ambulatory Visit
Admission: RE | Admit: 2024-02-01 | Discharge: 2024-02-01 | Disposition: A | Discharge status: Home / Self Care | Attending: Urgent Care | Admitting: Urgent Care

## 2024-02-01 VITALS — BP 111/72 | HR 98 | Temp 98.9°F | Resp 19

## 2024-02-01 DIAGNOSIS — K5732 Diverticulitis of large intestine without perforation or abscess without bleeding: Secondary | ICD-10-CM | POA: Diagnosis not present

## 2024-02-01 DIAGNOSIS — E860 Dehydration: Secondary | ICD-10-CM | POA: Insufficient documentation

## 2024-02-01 DIAGNOSIS — E876 Hypokalemia: Secondary | ICD-10-CM | POA: Diagnosis not present

## 2024-02-01 DIAGNOSIS — E871 Hypo-osmolality and hyponatremia: Secondary | ICD-10-CM | POA: Diagnosis not present

## 2024-02-01 DIAGNOSIS — R1032 Left lower quadrant pain: Secondary | ICD-10-CM

## 2024-02-01 DIAGNOSIS — R1030 Lower abdominal pain, unspecified: Secondary | ICD-10-CM | POA: Diagnosis present

## 2024-02-01 DIAGNOSIS — K5792 Diverticulitis of intestine, part unspecified, without perforation or abscess without bleeding: Secondary | ICD-10-CM

## 2024-02-01 LAB — COMPREHENSIVE METABOLIC PANEL WITH GFR
ALT: 39 U/L (ref 0–44)
AST: 38 U/L (ref 15–41)
Albumin: 4.1 g/dL (ref 3.5–5.0)
Alkaline Phosphatase: 108 U/L (ref 38–126)
Anion gap: 16 — ABNORMAL HIGH (ref 5–15)
BUN: 15 mg/dL (ref 8–23)
CO2: 22 mmol/L (ref 22–32)
Calcium: 9.5 mg/dL (ref 8.9–10.3)
Chloride: 90 mmol/L — ABNORMAL LOW (ref 98–111)
Creatinine, Ser: 0.89 mg/dL (ref 0.44–1.00)
GFR, Estimated: >60 mL/min (ref >60)
Glucose, Bld: 97 mg/dL (ref 70–99)
Potassium: 3.2 mmol/L — ABNORMAL LOW (ref 3.5–5.1)
Sodium: 128 mmol/L — ABNORMAL LOW (ref 135–145)
Total Bilirubin: 0.3 mg/dL (ref 0.0–1.2)
Total Protein: 7.6 g/dL (ref 6.5–8.1)

## 2024-02-01 LAB — URINALYSIS, ROUTINE W REFLEX MICROSCOPIC
Glucose, UA: NEGATIVE mg/dL
Hgb urine dipstick: NEGATIVE
Ketones, ur: NEGATIVE mg/dL
Leukocytes,Ua: NEGATIVE
Nitrite: NEGATIVE
Protein, ur: 100 mg/dL — AB
Specific Gravity, Urine: 1.025 (ref 1.005–1.030)
pH: 6 (ref 5.0–8.0)

## 2024-02-01 LAB — CBC
HCT: 33.9 % — ABNORMAL LOW (ref 36.0–46.0)
Hemoglobin: 11.3 g/dL — ABNORMAL LOW (ref 12.0–15.0)
MCH: 28.5 pg (ref 26.0–34.0)
MCHC: 33.3 g/dL (ref 30.0–36.0)
MCV: 85.6 fL (ref 80.0–100.0)
Platelets: 448 K/uL — ABNORMAL HIGH (ref 150–400)
RBC: 3.96 MIL/uL (ref 3.87–5.11)
RDW: 15 % (ref 11.5–15.5)
WBC: 10.3 K/uL (ref 4.0–10.5)
nRBC: 0 % (ref 0.0–0.2)

## 2024-02-01 LAB — URINALYSIS, MICROSCOPIC (REFLEX)

## 2024-02-01 LAB — LIPASE, BLOOD: Lipase: 13 U/L (ref 11–51)

## 2024-02-01 MED ORDER — POTASSIUM CHLORIDE CRYS ER 20 MEQ PO TBCR
40.0000 meq | EXTENDED_RELEASE_TABLET | Freq: Once | ORAL | Status: AC
  Administered 2024-02-01: 40 meq via ORAL
  Filled 2024-02-01: qty 2

## 2024-02-01 MED ORDER — SODIUM CHLORIDE 0.9 % IV BOLUS
500.0000 mL | Freq: Once | INTRAVENOUS | Status: AC
  Administered 2024-02-01: 500 mL via INTRAVENOUS

## 2024-02-01 MED ORDER — IOHEXOL 300 MG/ML  SOLN
100.0000 mL | Freq: Once | INTRAMUSCULAR | Status: AC | PRN
  Administered 2024-02-01: 100 mL via INTRAVENOUS

## 2024-02-01 MED ORDER — AMOXICILLIN-POT CLAVULANATE 875-125 MG PO TABS: 1.0000 | ORAL_TABLET | Freq: Two times a day (BID) | ORAL | 0 refills | Status: AC

## 2024-02-01 NOTE — ED Provider Notes (Signed)
 Care assumed from Dr. Tonia. At time of transfer of care, pt awaiting results of CT abd pelvis to rule out concerning etiology of abd pain and diarrhea.   3:49 PM CT scan returned showing evidence of acute diverticulitis but no complicating factors such as abscess or perforation.  We went through all the findings together including the recommendation from radiology to follow-up with GI for colonoscopy to rule out concerning tissue such as cancer.  Family agrees.  They will use home medications to help with symptoms and we will send in a prescription for Augmentin  for her.  They agreed with plan of care and had no other questions or concerns.  Patient we discharged for outpatient follow-up.   Clinical Impression: 1. Diverticulitis   2. Dehydration   3. Hyponatremia   4. Hypokalemia     Disposition: Discharge  Condition: Good  I have discussed the results, Dx and Tx plan with the pt(& family if present). He/she/they expressed understanding and agree(s) with the plan. Discharge instructions discussed at great length. Strict return precautions discussed and pt &/or family have verbalized understanding of the instructions. No further questions at time of discharge.    New Prescriptions   AMOXICILLIN -CLAVULANATE (AUGMENTIN ) 875-125 MG TABLET    Take 1 tablet by mouth every 12 (twelve) hours for 10 days.    Follow Up: Lowella Benton CROME, [place] [address] [place] [address] [phone]     [facility] Emergency Department at [facility] [address] [place]  [address] [phone]       [place]rokee Boccio, Lonni PARAS, MD 02/01/24 1550

## 2024-02-01 NOTE — ED Triage Notes (Signed)
 Pt states that she has been sick for about 4 weeks. Pt has been having abdominal pain and loose stools, she has been having clear mucous when she wipes, fever up to 102F.  Pt was seen at [facility] and told to come to ED for further evaluation.

## 2024-02-01 NOTE — Discharge Instructions (Addendum)
 Advised patient/daughter to go to [facility] ED now for further evaluation of abdominal pain and probable CT of abdomen and pelvis with contrast.

## 2024-02-01 NOTE — ED Provider Notes (Signed)
 [facility]    CSN: [id number] Arrival date & time: 02/01/24  1205      History   Chief Complaint Chief Complaint  Patient presents with   Abdominal Pain    HPI ILSE BILLMAN is a 73 y.o. female.   HPI Pleasant 73 year old female presents with abdominal pain and loose stools for 4 weeks and fever.  Patient is accompanied by her daughter and reports history of diverticulitis.  PMH significant for obesity, HTN, and HLD. Daughter reports that she gave her mother Bentyl and she is eating Activia and drinking Powerade.  Past Medical History:  Diagnosis Date   Chicken pox    Depression    Diverticulitis    Dizziness and giddiness    Dyspnea    Fatigue    GERD (gastroesophageal reflux disease)    High blood pressure    Lightheadedness    Palpitations    SOB (shortness of breath)    Weakness     Patient Active Problem List   Diagnosis Date Noted   Critical polytrauma 05/02/2023   Fall 04/25/2023   Normocytic anemia 04/25/2023   Refusal of blood product 04/25/2023   Fracture of humeral head, closed, left, initial encounter 04/25/2023   Eyebrow laceration, right, initial encounter 04/25/2023   Left patella fracture 04/25/2023   Closed displaced comminuted fracture of shaft of right humerus 04/25/2023   Leukocytosis 04/25/2023   Essential hypertension 04/25/2023   Anxiety and depression 04/25/2023   Hyperlipidemia 04/25/2023    Past Surgical History:  Procedure Laterality Date   APPENDECTOMY  1976   CESAREAN SECTION  1981   CESAREAN SECTION  1969   cholecystectomy  1976   ORIF HUMERUS FRACTURE Right 04/27/2023   Procedure: OPEN REDUCTION INTERNAL FIXATION (ORIF) HUMERAL SHAFT FRACTURE;  Surgeon: Kendal Franky SQUIBB, MD;  Location: [facility] OR;  Service: Orthopedics;  Laterality: Right;    OB History   No obstetric history on file.      Home Medications    Prior to Admission medications   Medication Sig Start Date End Date Taking? Authorizing Provider  B  Complex-C (B-COMPLEX WITH VITAMIN C ) tablet Take 1 tablet by mouth daily. 05/11/23   Angiulli, Toribio PARAS, PA-C  buPROPion  (WELLBUTRIN  XL) 300 MG 24 hr tablet Take 1 tablet (300 mg total) by mouth daily. 05/11/23   Angiulli, Toribio PARAS, PA-C  cyanocobalamin  (VITAMIN B12) 1000 MCG/ML injection Inject 1 mL (1,000 mcg total) into the muscle every 30 (thirty) days. 10/02/23   Crain, Whitney L, PA  escitalopram  (LEXAPRO ) 20 MG tablet Take 1 tablet (20 mg total) by mouth daily. 05/11/23   Angiulli, Toribio PARAS, PA-C  esomeprazole  (NEXIUM ) 40 MG capsule Take 1 capsule (40 mg total) by mouth daily. 05/11/23   Angiulli, Toribio PARAS, PA-C  ezetimibe  (ZETIA ) 10 MG tablet Take 1 tablet (10 mg total) by mouth daily. 10/28/23   Crain, Whitney L, PA  Ferrous Fumarate -Vitamin C  ER 65-25 MG TBCR Take 1 tablet by mouth daily. 09/24/23   Crain, Whitney L, PA  fluticasone  (FLONASE ) 50 MCG/ACT nasal spray Place 1 spray into both nostrils daily. 05/03/23   Arlice Reichert, MD  ibuprofen  (ADVIL ) 400 MG tablet Take 1 tablet (400 mg total) by mouth every 6 (six) hours as needed (pain not relieved with tylenol ). 05/02/23   Dahal, Binaya, MD  lamoTRIgine (LAMICTAL) 100 MG tablet Take 100 mg by mouth daily.    [provider]  LORazepam  (ATIVAN ) 1 MG tablet Take 1 tablet (1 mg  total) by mouth 2 (two) times daily. 05/11/23   Angiulli, Toribio PARAS, PA-C  losartan -hydrochlorothiazide  (HYZAAR) 50-12.5 MG tablet Take 1 tablet by mouth daily. 12/28/23   Crain, Whitney L, PA  magnesium  oxide (MAG-OX) 400 (240 Mg) MG tablet Take 400 mg by mouth daily.    [provider]  Red Yeast Rice Extract (RED YEAST RICE PO) Take by mouth.    [provider]  traZODone (DESYREL) 50 MG tablet Take 1 tablet by mouth at bedtime. 08/11/23 02/07/24  [provider]  Turmeric 500 MG CAPS Take by mouth.    [provider]    Family History Family History  Problem Relation Age of Onset   Arrhythmia Mother    Heart attack  Father    Cancer Father     Social History Social History   Tobacco Use   Smoking status: Never   Smokeless tobacco: Never  Vaping Use   Vaping status: Never Used  Substance Use Topics   Alcohol use: Yes   Drug use: Never     Allergies   Codeine, Morphine and codeine, and Sulfa antibiotics   Review of Systems Review of Systems  Gastrointestinal:  Positive for abdominal pain.     Physical Exam Triage Vital Signs ED Triage Vitals  Encounter Vitals Group     BP      Girls Systolic BP Percentile      Girls Diastolic BP Percentile      Boys Systolic BP Percentile      Boys Diastolic BP Percentile      Pulse      Resp      Temp      Temp src      SpO2      Weight      Height      Head Circumference      Peak Flow      Pain Score      Pain Loc      Pain Education      Exclude from Growth Chart    No data found.  Updated Vital Signs BP 111/72   Pulse 98   Temp 98.9 F (37.2 C)   Resp 19   SpO2 95%    Physical Exam Vitals and nursing note reviewed.  Constitutional:      Appearance: She is well-developed and normal weight.  HENT:     Head: Normocephalic and atraumatic.     Mouth/Throat:     Mouth: Mucous membranes are moist.     Pharynx: Oropharynx is clear.  Eyes:     Extraocular Movements: Extraocular movements intact.     Pupils: Pupils are equal, round, and reactive to light.  Cardiovascular:     Rate and Rhythm: Normal rate and regular rhythm.     Heart sounds: Normal heart sounds.  Abdominal:     General: Abdomen is flat. Bowel sounds are absent. There is no distension.     Palpations: There is no shifting dullness, fluid wave, hepatomegaly, splenomegaly, mass or pulsatile mass.     Tenderness: There is abdominal tenderness in the right lower quadrant and left lower quadrant. There is no right CVA tenderness, left CVA tenderness, guarding or rebound. Negative signs include Murphy's sign, Rovsing's sign, McBurney's sign and psoas sign.      Hernia: No hernia is present.  Skin:    General: Skin is warm and dry.  Neurological:     General: No focal deficit present.  Mental Status: She is alert and oriented to person, place, and time.  Psychiatric:        Mood and Affect: Mood normal.        Behavior: Behavior normal.      UC Treatments / Results  Labs (all labs ordered are listed, but only abnormal results are displayed) Labs Reviewed - No data to display  EKG   Radiology No results found.  Procedures Procedures (including critical care time)  Medications Ordered in UC Medications - No data to display  Initial Impression / Assessment and Plan / UC Course  I have reviewed the triage vital signs and the nursing notes.  Pertinent labs & imaging results that were available during my care of the patient were reviewed by me and considered in my medical decision making (see chart for details).    MDM: 1. Left lower quadrant abdominal pain-Advised patient/daughter to go to [facility] ED now for further evaluation of abdominal pain and probable CT of abdomen and pelvis with contrast.  Patient and daughter agreed and verbalized understanding of these instructions and this plan of care today. Final Clinical Impressions(s) / UC Diagnoses   Final diagnoses:  Left lower quadrant abdominal pain     Discharge Instructions      Advised patient/daughter to go to [facility] ED now for further evaluation of abdominal pain and probable CT of abdomen and pelvis with contrast.     ED Prescriptions   None    PDMP not reviewed this encounter.   Teddy Sharper, FNP 02/01/24 1237

## 2024-02-01 NOTE — ED Provider Notes (Signed)
 [facility] Provider Note   CSN: [id number] Arrival date & time: 02/01/24  1250     Patient presents with: Abdominal Pain   Morgan Potter is a 73 y.o. female.  {Add pertinent medical, surgical, social history, OB history to YEP:67052} Patient presents with daughter for recurrent diarrhea and lower abdominal discomfort intermittent for the past 4 weeks.  Patient has history of diverticulosis denies diverticulitis.  Patient had colonoscopy approximately 3 years ago was told it was unremarkable.  Patient had intermittent fevers subjective and then up to 102.  Mucus in her stools no significant blood or melena.  The history is provided by the patient and a relative.  Abdominal Pain Associated symptoms: nausea   Associated symptoms: no chest pain, no chills, no dysuria, no fever, no shortness of breath and no vomiting        Prior to Admission medications   Medication Sig Start Date End Date Taking? Authorizing Provider  B Complex-C (B-COMPLEX WITH VITAMIN C ) tablet Take 1 tablet by mouth daily. 05/11/23   Angiulli, Toribio PARAS, PA-C  buPROPion  (WELLBUTRIN  XL) 300 MG 24 hr tablet Take 1 tablet (300 mg total) by mouth daily. 05/11/23   Angiulli, Toribio PARAS, PA-C  cyanocobalamin  (VITAMIN B12) 1000 MCG/ML injection Inject 1 mL (1,000 mcg total) into the muscle every 30 (thirty) days. 10/02/23   Crain, Whitney L, PA  escitalopram  (LEXAPRO ) 20 MG tablet Take 1 tablet (20 mg total) by mouth daily. 05/11/23   Angiulli, Toribio PARAS, PA-C  esomeprazole  (NEXIUM ) 40 MG capsule Take 1 capsule (40 mg total) by mouth daily. 05/11/23   Angiulli, Toribio PARAS, PA-C  ezetimibe  (ZETIA ) 10 MG tablet Take 1 tablet (10 mg total) by mouth daily. 10/28/23   Crain, Whitney L, PA  Ferrous Fumarate -Vitamin C  ER 65-25 MG TBCR Take 1 tablet by mouth daily. 09/24/23   Crain, Whitney L, PA  fluticasone  (FLONASE ) 50 MCG/ACT nasal spray Place 1 spray into both nostrils daily. 05/03/23    Arlice Reichert, MD  ibuprofen  (ADVIL ) 400 MG tablet Take 1 tablet (400 mg total) by mouth every 6 (six) hours as needed (pain not relieved with tylenol ). 05/02/23   Dahal, Binaya, MD  lamoTRIgine (LAMICTAL) 100 MG tablet Take 100 mg by mouth daily.    [provider]  LORazepam  (ATIVAN ) 1 MG tablet Take 1 tablet (1 mg total) by mouth 2 (two) times daily. 05/11/23   Angiulli, Toribio PARAS, PA-C  losartan -hydrochlorothiazide  (HYZAAR) 50-12.5 MG tablet Take 1 tablet by mouth daily. 12/28/23   Crain, Whitney L, PA  magnesium  oxide (MAG-OX) 400 (240 Mg) MG tablet Take 400 mg by mouth daily.    [provider]  Red Yeast Rice Extract (RED YEAST RICE PO) Take by mouth.    [provider]  traZODone (DESYREL) 50 MG tablet Take 1 tablet by mouth at bedtime. 08/11/23 02/07/24  [provider]  Turmeric 500 MG CAPS Take by mouth.    [provider]    Allergies: Codeine, Morphine and codeine, and Sulfa antibiotics    Review of Systems  Constitutional:  Negative for chills and fever.  HENT:  Negative for congestion.   Eyes:  Negative for visual disturbance.  Respiratory:  Negative for shortness of breath.   Cardiovascular:  Negative for chest pain.  Gastrointestinal:  Positive for abdominal pain and nausea. Negative for vomiting.  Genitourinary:  Negative for dysuria and flank pain.  Musculoskeletal:  Negative for back pain, neck pain  and neck stiffness.  Skin:  Negative for rash.  Neurological:  Negative for light-headedness and headaches.    Updated Vital Signs BP 129/83 (BP Location: Right Arm)   Pulse 90   Temp 98.8 F (37.1 C)   Resp 20   SpO2 97%   Physical Exam Vitals and nursing note reviewed.  Constitutional:      General: She is not in acute distress.    Appearance: She is well-developed.  HENT:     Head: Normocephalic and atraumatic.     Comments: Mild dry mucous membrane Eyes:     General:        Right eye: No discharge.        Left  eye: No discharge.     Conjunctiva/sclera: Conjunctivae normal.  Neck:     Trachea: No tracheal deviation.  Cardiovascular:     Rate and Rhythm: Normal rate and regular rhythm.  Pulmonary:     Effort: Pulmonary effort is normal.     Breath sounds: Normal breath sounds.  Abdominal:     General: There is no distension.     Palpations: Abdomen is soft.     Tenderness: There is no abdominal tenderness. There is no guarding.  Musculoskeletal:     Cervical back: Normal range of motion and neck supple. No rigidity.  Skin:    General: Skin is warm.     Capillary Refill: Capillary refill takes less than 2 seconds.     Findings: No rash.  Neurological:     General: No focal deficit present.     Mental Status: She is alert.     Cranial Nerves: No cranial nerve deficit.  Psychiatric:        Mood and Affect: Mood normal.     (all labs ordered are listed, but only abnormal results are displayed) Labs Reviewed  COMPREHENSIVE METABOLIC PANEL WITH GFR - Abnormal; Notable for the following components:      Result Value   Sodium 128 (*)    Potassium 3.2 (*)    Chloride 90 (*)    Anion gap 16 (*)    All other components within normal limits  CBC - Abnormal; Notable for the following components:   Hemoglobin 11.3 (*)    HCT 33.9 (*)    Platelets 448 (*)    All other components within normal limits  URINALYSIS, ROUTINE W REFLEX MICROSCOPIC - Abnormal; Notable for the following components:   Bilirubin Urine SMALL (*)    Protein, ur 100 (*)    All other components within normal limits  URINALYSIS, MICROSCOPIC (REFLEX) - Abnormal; Notable for the following components:   Bacteria, UA RARE (*)    All other components within normal limits  LIPASE, BLOOD    EKG: None  Radiology: No results found.  {Document cardiac monitor, telemetry assessment procedure when appropriate:32947} Procedures   Medications Ordered in the ED  potassium chloride  SA (KLOR-CON  M) CR tablet 40 mEq (has no  administration in time range)  sodium chloride  0.9 % bolus 500 mL (has no administration in time range)  sodium chloride  0.9 % bolus 500 mL (500 mLs Intravenous New Bag/Given 02/01/24 1416)  iohexol  (OMNIPAQUE ) 300 MG/ML solution 100 mL (100 mLs Intravenous Contrast Given 02/01/24 1440)      {Click here for ABCD2, HEART and other calculators REFRESH Note before signing:1}  Medical Decision Making Amount and/or Complexity of Data Reviewed Labs: ordered. Radiology: ordered.  Risk Prescription drug management.   Patient presents with recurrent diarrhea differential includes toxemia, viral, bacterial, colitis, enteritis, diverticulitis, stress-induced, food side effects, other.  IV fluids started due to clinical concern for dehydration.  Blood work independent reviewed mild hyponatremia 132, mild hypokalemia 3.2.  Patient also on hydrochlorothiazide .  Oral potassium and IV fluid bolus normal saline given.  Mild anemia 11.3 without clinical signs of significant bleeding/hemorrhage.  Urinalysis reviewed no signs of infection no urinary symptoms.  No significant abdominal pain at this time however with duration of symptoms and older age plan for CT abdomen pelvis for further delineation.  Patient comfortable plan.    {Document critical care time when appropriate  Document review of labs and clinical decision tools ie CHADS2VASC2, etc  Document your independent review of radiology images and any outside records  Document your discussion with family members, caretakers and with consultants  Document social determinants of health affecting pt's care  Document your decision making why or why not admission, treatments were needed:32947:::1}   Final diagnoses:  Dehydration  Hyponatremia  Hypokalemia    ED Discharge Orders     None

## 2024-02-01 NOTE — ED Notes (Signed)
 Patient is being discharged from the Urgent Care and sent to the Emergency Department via pov with daughter . Per ragan NP, patient is in need of higher level of care due to concern for ABD pain. Patient is aware and verbalizes understanding of plan of care.  Vitals:   02/01/24 1221 02/01/24 1222  BP:  111/72  Pulse: 98   Resp: 19   Temp: 98.9 F (37.2 C)   SpO2: 95%

## 2024-02-01 NOTE — ED Triage Notes (Addendum)
 Pt presents to uc with co loose stool for 4 weeks. Pt reports she started having a fever over the last two weeks with pain worsening last week. Pt has a history of diverticulitis. Pt has been eating activa and drinking power aid.     Pt daughter at bedside. Pt daughter gave her bentyl

## 2024-02-01 NOTE — Discharge Instructions (Signed)
 Your history, exam, and workup today revealed acute diverticulitis as a cause of your symptoms.  There was no complication such as perforation or abscess and given her well appearance and reassuring vital signs they feel you are safe for discharge home.  Your electrolytes and potassium were slightly low so please try to rehydrate as best you can.  If any symptoms change or worsen acutely, please return to the nearest emergency department.  Please follow-up with your primary care physician and GI team as well as we discussed.

## 2024-02-02 ENCOUNTER — Telehealth: Payer: Self-pay

## 2024-02-02 NOTE — Telephone Encounter (Signed)
 LMTRC per call back protocol

## 2024-02-03 NOTE — Telephone Encounter (Signed)
 It looks like this message was routed from a different department on the 15th.  Please call patient and see if she is okay hopefully she did go ahead and go to the urgent care per the note below.

## 2024-02-03 NOTE — Telephone Encounter (Signed)
LM for pt to return call. Tinea Nobile, CMA  

## 2024-02-05 ENCOUNTER — Ambulatory Visit: Admitting: Physician Assistant

## 2024-02-23 ENCOUNTER — Ambulatory Visit (INDEPENDENT_AMBULATORY_CARE_PROVIDER_SITE_OTHER): Admitting: Gastroenterology

## 2024-02-23 ENCOUNTER — Encounter: Payer: Self-pay | Admitting: Gastroenterology

## 2024-02-23 VITALS — BP 126/80 | HR 96 | Temp 97.3°F | Ht 69.0 in | Wt 205.5 lb

## 2024-02-23 DIAGNOSIS — K449 Diaphragmatic hernia without obstruction or gangrene: Secondary | ICD-10-CM | POA: Diagnosis not present

## 2024-02-23 DIAGNOSIS — D509 Iron deficiency anemia, unspecified: Secondary | ICD-10-CM

## 2024-02-23 DIAGNOSIS — E538 Deficiency of other specified B group vitamins: Secondary | ICD-10-CM

## 2024-02-23 DIAGNOSIS — D508 Other iron deficiency anemias: Secondary | ICD-10-CM

## 2024-02-23 NOTE — Progress Notes (Signed)
 [facility] Consult Note:  History: Morgan Potter 02/23/2024  Referring provider: Lowella Benton CROME, PA  Reason for consult/chief complaint: iron deficiency  (Pcp referral wants to make sure pt colon is not bleeding, had severe diverticulitis about a week ago through ER)   Subjective  Prior history:  [facility] for epigastric/lower sternal pain, episodic black stool and NSAID use.  EGD reportedly showed medium sized hiatal hernia, otherwise normal to second portion duodenum) In November 2022, she saw Dr.Scott Cornella at [facility] in [place] for iron deficiency anemia and underwent EGD and colonoscopy.  Those reports are not available in this EHR.    Discussed the use of AI scribe software for clinical note transcription with the patient, who gave verbal consent to proceed.  History of Present Illness Referred to us  at this time by her [place] primary care provider for anemia with both B12 and iron deficiency, the latter persisting despite oral supplements.  (Somewhat tangential historian) Morgan Potter is a 73 year old female with iron deficiency anemia who presents for evaluation of low iron and B12 levels. She was referred by her primary care provider for evaluation of low iron and B12 levels.  She has a history of low iron and B12 levels, identified after her new primary care provider conducted several tests. She was previously unaware of her iron deficiency anemia, and does not recall being told she had this condition or that her prior upper endoscopy and colonoscopy were performed for this reason. (2022 outside GI practice office note clearly indicates that workup was for IDA)  She has experienced episodes of diverticulitis, with a recent severe episode leading to an emergency department visit where she was diagnosed with acute diverticulitis.  She completed the antibiotics and feels recovered at this point  She  has a known hiatal hernia and occasionally takes Zantac when consuming spicy foods or eating late to manage symptoms.  That said, symptoms of reflux do not occur on a regular basis.  No current digestive symptoms such as stomach pain, blood in stools, nausea, vomiting, or trouble swallowing.  No overt rectal bleeding or black stool.  She reports having been provided with stool cards to check for microscopic blood by primary care, but has not completed them due to frequent illness. ____________  ED visit 02/01/2024 for several days of lower abdominal pain and fever, diagnosed with diverticulitis on CTAP and found to be volume depleted with hyponatremia and hypokalemia.  Received IV fluids and potassium.  HCTZ thought possibly could be contributing to the fluid and electrolyte disturbance.   ROS:  Review of Systems  Constitutional:  Negative for appetite change and unexpected weight change.  HENT:  Negative for mouth sores and voice change.   Eyes:  Negative for pain and redness.  Respiratory:  Negative for cough and shortness of breath.        Currently has a cough and congestion  Cardiovascular:  Negative for chest pain and palpitations.  Genitourinary:  Negative for dysuria and hematuria.  Musculoskeletal:  Negative for arthralgias and myalgias.  Skin:  Negative for pallor and rash.  Neurological:  Negative for weakness and headaches.  Hematological:  Negative for adenopathy.     Past Medical History: Past Medical History:  Diagnosis Date   Chicken pox    Depression    Diverticulitis    Dizziness and giddiness    Dyspnea    Fatigue    GERD (gastroesophageal  reflux disease)    High blood pressure    Lightheadedness    Palpitations    Skin cancer 2025   SOB (shortness of breath)    Weakness      Past Surgical History: Past Surgical History:  Procedure Laterality Date   APPENDECTOMY  1976   CESAREAN SECTION  1981   CESAREAN SECTION  1969   cholecystectomy  1976   ORIF  HUMERUS FRACTURE Right 04/27/2023   Procedure: OPEN REDUCTION INTERNAL FIXATION (ORIF) HUMERAL SHAFT FRACTURE;  Surgeon: Kendal Franky SQUIBB, MD;  Location: [facility] OR;  Service: Orthopedics;  Laterality: Right;     Family History: Family History  Problem Relation Age of Onset   Arrhythmia Mother    Heart attack Father    Cancer Father     Social History: Social History   Socioeconomic History   Marital status: Widowed    Spouse name: Not on file   Number of children: Not on file   Years of education: Not on file   Highest education level: Not on file  Occupational History   Not on file  Tobacco Use   Smoking status: Never   Smokeless tobacco: Never  Vaping Use   Vaping status: Never Used  Substance and Sexual Activity   Alcohol use: Yes   Drug use: Never   Sexual activity: Not Currently  Other Topics Concern   Not on file  Social History Narrative   Not on file   Social Drivers of Health   Financial Resource Strain: Low Risk  (06/26/2023)   Received from [facility]   Overall Financial Resource Strain (CARDIA)    Difficulty of Paying Living Expenses: Not hard at all  Food Insecurity: No Food Insecurity (06/26/2023)   Received from [facility]   Hunger Vital Sign    Within the past 12 months, you worried that your food would run out before you got the money to buy more.: Never true    Within the past 12 months, the food you bought just didn't last and you didn't have money to get more.: Never true  Transportation Needs: No Transportation Needs (06/26/2023)   Received from [facility] - Transportation    Lack of Transportation (Medical): No    Lack of Transportation (Non-Medical): No  Physical Activity: Insufficiently Active (08/10/2022)   Received from [facility]   Exercise Vital Sign    On average, how many days per week do you engage in moderate to strenuous exercise (like a brisk walk)?: 1 day    On average, how many minutes do you engage in exercise at  this level?: 20 min  Stress: Stress Concern Present (08/10/2022)   Received from [facility] - Occupational Stress Questionnaire    Feeling of Stress : To some extent  Social Connections: Moderately Integrated (08/10/2022)   Received from [facility]   Social Network    How would you rate your social network (family, work, friends)?: Adequate participation with social networks    Allergies: Allergies  Allergen Reactions   Codeine    Morphine And Codeine    Sulfa Antibiotics     Outpatient Meds: Current Outpatient Medications  Medication Sig Dispense Refill   B Complex-C (B-COMPLEX WITH VITAMIN C ) tablet Take 1 tablet by mouth daily. 30 tablet 0   buPROPion  (WELLBUTRIN  XL) 300 MG 24 hr tablet Take 1 tablet (300 mg total) by mouth daily. 30 tablet 0   busPIRone (  BUSPAR) 5 MG tablet Take 5 mg by mouth 2 (two) times daily.     cyanocobalamin  (VITAMIN B12) 1000 MCG/ML injection Inject 1 mL (1,000 mcg total) into the muscle every 30 (thirty) days. 3 mL 2   escitalopram  (LEXAPRO ) 20 MG tablet Take 1 tablet (20 mg total) by mouth daily. 30 tablet 0   esomeprazole  (NEXIUM ) 40 MG capsule Take 1 capsule (40 mg total) by mouth daily. 30 capsule 0   ezetimibe  (ZETIA ) 10 MG tablet Take 1 tablet (10 mg total) by mouth daily. 90 tablet 3   Ferrous Fumarate -Vitamin C  ER 65-25 MG TBCR Take 1 tablet by mouth daily. 90 tablet 2   fluticasone  (FLONASE ) 50 MCG/ACT nasal spray Place 1 spray into both nostrils daily.     ibuprofen  (ADVIL ) 400 MG tablet Take 1 tablet (400 mg total) by mouth every 6 (six) hours as needed (pain not relieved with tylenol ).     lamoTRIgine (LAMICTAL) 100 MG tablet Take 100 mg by mouth daily.     LORazepam  (ATIVAN ) 1 MG tablet Take 1 tablet (1 mg total) by mouth 2 (two) times daily. 30 tablet 0   losartan -hydrochlorothiazide  (HYZAAR) 50-12.5 MG tablet Take 1 tablet by mouth daily. 90 tablet 1   magnesium  oxide (MAG-OX) 400 (240 Mg)  MG tablet Take 400 mg by mouth daily.     Red Yeast Rice Extract (RED YEAST RICE PO) Take by mouth.     Turmeric 500 MG CAPS Take by mouth.     No current facility-administered medications for this visit.      ___________________________________________________________________ Objective   Exam:  BP 126/80   Pulse 96   Ht 5' 9 (1.753 m)   Wt 205 lb 8 oz (93.2 kg)   BMI 30.35 kg/m  Wt Readings from Last 3 Encounters:  02/23/24 205 lb 8 oz (93.2 kg)  02/01/24 207 lb (93.9 kg)  01/28/24 210 lb (95.3 kg)    General: Well-appearing Eyes: sclera anicteric, no redness ENT: oral mucosa moist without lesions, no cervical or supraclavicular lymphadenopathy CV: Regular without appreciable murmur, no JVD, no peripheral edema Resp: clear to auscultation bilaterally, normal RR and effort noted GI: soft, no tenderness, with active bowel sounds. No guarding or palpable organomegaly noted. Skin; warm and dry, no rash or jaundice noted Neuro: awake, alert and oriented x 3. Normal gross motor function and fluent speech   Labs:     Latest Ref Rng & Units 02/01/2024    1:10 PM 12/28/2023   12:26 PM 09/22/2023   11:22 AM  CBC  WBC 4.0 - 10.5 K/uL 10.3  6.7  5.6   Hemoglobin 12.0 - 15.0 g/dL 88.6  88.3  88.9   Hematocrit 36.0 - 46.0 % 33.9  37.1  33.4   Platelets 150 - 400 K/uL 448  433  461.0       Latest Ref Rng & Units 02/01/2024    1:10 PM 09/22/2023   11:22 AM 05/11/2023    5:16 AM  CMP  Glucose 70 - 99 mg/dL 97  896  891   BUN 8 - 23 mg/dL 15  18  17    Creatinine 0.44 - 1.00 mg/dL 9.10  9.10  9.02   Sodium 135 - 145 mmol/L 128  137  139   Potassium 3.5 - 5.1 mmol/L 3.2  4.7  3.9   Chloride 98 - 111 mmol/L 90  100  106   CO2 22 - 32 mmol/L 22  27  22  Calcium 8.9 - 10.3 mg/dL 9.5  9.8  9.1   Total Protein 6.5 - 8.1 g/dL 7.6  7.1    Total Bilirubin 0.0 - 1.2 mg/dL 0.3  0.5    Alkaline Phos 38 - 126 U/L 108  63    AST 15 - 41 U/L 38  16    ALT 0 - 44 U/L 39  11      Iron/TIBC/Ferritin/ %Sat    Component Value Date/Time   IRON 76 12/28/2023 1226   TIBC 423 12/28/2023 1226   FERRITIN 89 12/28/2023 1226   IRONPCTSAT 18 12/28/2023 1226   IRONPCTSAT 6 (L) 09/22/2023 1122   Iron 27 and ferritin 7 in May 2025  B12 205 in May, up to 510 in August  Radiologic Studies:  Narrative & Impression  CLINICAL DATA:  Left lower quadrant pain.   EXAM: CT ABDOMEN AND PELVIS WITH CONTRAST   TECHNIQUE: Multidetector CT imaging of the abdomen and pelvis was performed using the standard protocol following bolus administration of intravenous contrast.   RADIATION DOSE REDUCTION: This exam was performed according to the departmental dose-optimization program which includes automated exposure control, adjustment of the mA and/or kV according to patient size and/or use of iterative reconstruction technique.   CONTRAST:  OMNIPAQUE  IOHEXOL  300 MG/ML  SOLN   COMPARISON:  Lumbar spine 01/27/2014   FINDINGS: Lower chest: Atelectasis or scarring at the right lung base. Moderate to large sized hiatal hernia.   Hepatobiliary: Cholecystectomy. No biliary dilatation. Main portal venous system is patent.   Pancreas: Unremarkable. No pancreatic ductal dilatation or surrounding inflammatory changes.   Spleen: Normal in size without focal abnormality.   Adrenals/Urinary Tract: Adrenal glands are unremarkable. Kidneys are normal, without renal calculi, focal lesion, or hydronephrosis. Bladder is unremarkable.   Stomach/Bowel: Pericolonic inflammation in the distal sigmoid colon. There are multiple colonic diverticula in this area. Findings are compatible with acute diverticulitis. Small pockets of gas in the sigmoid colon on image 58/3 most likely represent diverticula. No clear evidence for extraluminal gas or abscess. Appendix appears to be surgically absent. No bowel dilatation. Large duodenal diverticulum with air-fluid level. Moderate to large sized  hiatal hernia. Lipomas involving the ascending colon.   Vascular/Lymphatic: Aortic atherosclerosis. No enlarged abdominal or pelvic lymph nodes.   Reproductive: Uterus and bilateral adnexa are unremarkable.   Other: Mild perirectal edema. Negative for free fluid. No definite free air.   Musculoskeletal: Again noted is a compression deformity along the inferior endplate of L2. No acute bone abnormality.   IMPRESSION: 1. Acute diverticulitis in the distal sigmoid colon. No evidence for an abscess or free air. Extensive diverticulosis. Recommend follow up CT or colonoscopy to ensure resolution and exclude a neoplastic process. 2. Moderate to large sized hiatal hernia. 3. Aortic Atherosclerosis (ICD10-I70.0).     Electronically Signed   By: Juliene Balder M.D.   On: 02/01/2024 15:25       Encounter Diagnoses  Name Primary?   Other iron deficiency anemia Yes   B12 deficiency    Hiatal hernia     Assessment and Plan Assessment & Plan Iron deficiency anemia This appears to be a chronic/recurrent issue, though she does not recall that. Not yet clear if it is truly from GI blood loss.  No localizing symptoms other than some occasional mild dyspepsia or reflux triggered by spicy food. - Request previous upper endoscopy and colonoscopy reports from last GI practice.  Wondering if hiatal hernia Cameron erosions were found or  any other potential source for GI blood loss.  Also wondering if duodenal biopsies were taken to rule out celiac sprue as a potential cause of iron malabsorption. - Instruct her to complete stool cards for fecal occult blood.  Vitamin B12 deficiency Vitamin B12 deficiency present, levels improving with supplementation.  Hiatal hernia Hiatal hernia present, contributing to reflux symptoms managed with Zantac as needed.  Once we have the above records and the result of stool studies noted above, we can decide if she needs endoscopic evaluation.   Thank you for  the courtesy of this consult.  Please call me with any questions or concerns.  Victory LITTIE Brand III  CC: Referring provider noted above

## 2024-02-23 NOTE — Patient Instructions (Signed)
 We are requesting records from Dr Glendia Europe.  _______________________________________________________  If your blood pressure at your visit was 140/90 or greater, please contact your primary care physician to follow up on this.  _______________________________________________________  If you are age 75 or older, your body mass index should be between 23-30. Your Body mass index is 30.35 kg/m. If this is out of the aforementioned range listed, please consider follow up with your Primary Care Provider.  If you are age 70 or younger, your body mass index should be between 19-25. Your Body mass index is 30.35 kg/m. If this is out of the aformentioned range listed, please consider follow up with your Primary Care Provider.   ________________________________________________________  The [facility] providers would like to encourage you to use MYCHART to communicate with providers for non-urgent requests or questions.  Due to long hold times on the telephone, sending your provider a message by [facility] may be a faster and more efficient way to get a response.  Please allow 48 business hours for a response.  Please remember that this is for non-urgent requests.  _______________________________________________________  [facility] is using a team-based approach to care.  Your team is made up of your doctor and two to three APPS. Our APPS (Nurse Practitioners and Physician Assistants) work with your physician to ensure care continuity for you. They are fully qualified to address your health concerns and develop a treatment plan. They communicate directly with your gastroenterologist to care for you. Seeing the Advanced Practice Practitioners on your physician's team can help you by facilitating care more promptly, often allowing for earlier appointments, access to diagnostic testing, procedures, and other specialty referrals.    Thank you for trusting me with your gastrointestinal care!     Dr. Victory Legrand DOUGLAS [facility]

## 2024-02-25 ENCOUNTER — Ambulatory Visit: Payer: Self-pay

## 2024-02-25 NOTE — Telephone Encounter (Signed)
 Patient scheduled tomorrow with Benton crain

## 2024-02-25 NOTE — Telephone Encounter (Signed)
 FYI Only or Action Required?: FYI only for provider.  Patient was last seen in primary care on 01/28/2024 by Lowella Benton CROME, PA.  Called Nurse Triage reporting Sore Throat.  Symptoms began several days ago.  Interventions attempted: Rest, hydration, or home remedies.  Symptoms are: gradually worsening.  Triage Disposition: See Physician Within 24 Hours  Patient/caregiver understands and will follow disposition?: Yes    Copied from CRM #[id number]. Topic: Clinical - Red Word Triage >> Feb 25, 2024 10:33 AM Jasmin G wrote: Red Word that prompted transfer to Nurse Triage: Sore painful throat, pt has been feeling really weak and tired, she's not currently running a fever but did the night before, symptoms started on Sunday. Pt states that she tested negative for covid. Reason for Disposition  SEVERE throat pain (e.g., excruciating)  Answer Assessment - Initial Assessment Questions Additional info: Home Covid 19 and flu test were negative on 02/24/24   1. ONSET: When did the throat start hurting? (Hours or days ago)      Sunday 2. SEVERITY: How bad is the sore throat? (Scale 1-10; mild, moderate or severe)     moderate 3. STREP EXPOSURE: Has there been any exposure to strep within the past week? If Yes, ask: What type of contact occurred?      unknown 4.  VIRAL SYMPTOMS: Are there any symptoms of a cold, such as a runny nose, cough, hoarse voice or red eyes?      Cough, congestion 5. FEVER: Do you have a fever? If Yes, ask: What is your temperature, how was it measured, and when did it start?     On Tuesday 6. PUS ON THE TONSILS: Is there pus on the tonsils in the back of your throat?     no 7. OTHER SYMPTOMS: Do you have any other symptoms? (e.g., difficulty breathing, headache, rash)     fatigue  8. PREGNANCY: Is there any chance you are pregnant? When was your last menstrual period?  Protocols used: Sore Throat-A-AH

## 2024-02-26 ENCOUNTER — Ambulatory Visit: Admitting: Urgent Care

## 2024-03-16 ENCOUNTER — Other Ambulatory Visit

## 2024-03-21 ENCOUNTER — Encounter: Payer: Self-pay | Admitting: Radiology

## 2024-04-28 ENCOUNTER — Ambulatory Visit: Admitting: Urgent Care

## 2024-05-30 ENCOUNTER — Ambulatory Visit: Admitting: Urgent Care

## 2024-05-30 ENCOUNTER — Ambulatory Visit

## 2024-05-30 VITALS — BP 130/82 | HR 84 | Ht 69.0 in | Wt 209.0 lb

## 2024-05-30 DIAGNOSIS — R296 Repeated falls: Secondary | ICD-10-CM

## 2024-05-30 DIAGNOSIS — E871 Hypo-osmolality and hyponatremia: Secondary | ICD-10-CM | POA: Diagnosis not present

## 2024-05-30 DIAGNOSIS — M25532 Pain in left wrist: Secondary | ICD-10-CM

## 2024-05-30 DIAGNOSIS — E876 Hypokalemia: Secondary | ICD-10-CM

## 2024-05-30 DIAGNOSIS — R4189 Other symptoms and signs involving cognitive functions and awareness: Secondary | ICD-10-CM | POA: Diagnosis not present

## 2024-05-30 DIAGNOSIS — S76212D Strain of adductor muscle, fascia and tendon of left thigh, subsequent encounter: Secondary | ICD-10-CM | POA: Diagnosis not present

## 2024-05-30 NOTE — Progress Notes (Unsigned)
 "  Established Patient Office Visit  Subjective:  Patient ID: Morgan Potter, female    DOB: 08-May-1951  Age: 74 y.o. MRN: [id number]  Chief Complaint  Patient presents with   Medical Management of Chronic Issues    Wt loss options, left wrist still weak, memory issues, pelvic/groin pain    HPI  Patient Active Problem List   Diagnosis Date Noted   Critical polytrauma 05/02/2023   Fall 04/25/2023   Normocytic anemia 04/25/2023   Refusal of blood product 04/25/2023   Fracture of humeral head, closed, left, initial encounter 04/25/2023   Eyebrow laceration, right, initial encounter 04/25/2023   Left patella fracture 04/25/2023   Closed displaced comminuted fracture of shaft of right humerus 04/25/2023   Leukocytosis 04/25/2023   Essential hypertension 04/25/2023   Anxiety and depression 04/25/2023   Hyperlipidemia 04/25/2023   Past Medical History:  Diagnosis Date   Chicken pox    Depression    Diverticulitis    Dizziness and giddiness    Dyspnea    Fatigue    GERD (gastroesophageal reflux disease)    High blood pressure    Lightheadedness    Palpitations    Skin cancer 2025   SOB (shortness of breath)    Weakness    Past Surgical History:  Procedure Laterality Date   APPENDECTOMY  1976   CESAREAN SECTION  1981   CESAREAN SECTION  1969   cholecystectomy  1976   ORIF HUMERUS FRACTURE Right 04/27/2023   Procedure: OPEN REDUCTION INTERNAL FIXATION (ORIF) HUMERAL SHAFT FRACTURE;  Surgeon: Kendal Franky SQUIBB, MD;  Location: [facility] OR;  Service: Orthopedics;  Laterality: Right;   Social History[1]    ROS: as noted in HPI  Objective:     BP 130/82   Pulse 84   Ht 5' 9 (1.753 m)   Wt 209 lb (94.8 kg)   SpO2 94%   BMI 30.86 kg/m  BP Readings from Last 3 Encounters:  05/30/24 130/82  02/23/24 126/80  02/01/24 133/81   Wt Readings from Last 3 Encounters:  05/30/24 209 lb (94.8 kg)  02/23/24 205 lb 8 oz (93.2 kg)  02/01/24 207 lb  (93.9 kg)      Physical Exam   No results found for any visits on 05/30/24.  Last CBC Lab Results  Component Value Date   WBC 10.3 02/01/2024   HGB 11.3 (L) 02/01/2024   HCT 33.9 (L) 02/01/2024   MCV 85.6 02/01/2024   MCH 28.5 02/01/2024   RDW 15.0 02/01/2024   PLT 448 (H) 02/01/2024   Last metabolic panel Lab Results  Component Value Date   GLUCOSE 97 02/01/2024   NA 128 (L) 02/01/2024   K 3.2 (L) 02/01/2024   CL 90 (L) 02/01/2024   CO2 22 02/01/2024   BUN 15 02/01/2024   CREATININE 0.89 02/01/2024   GFRNONAA >60 02/01/2024   CALCIUM 9.5 02/01/2024   PROT 7.6 02/01/2024   ALBUMIN 4.1 02/01/2024   BILITOT 0.3 02/01/2024   ALKPHOS 108 02/01/2024   AST 38 02/01/2024   ALT 39 02/01/2024   ANIONGAP 16 (H) 02/01/2024   Last lipids Lab Results  Component Value Date   CHOL 200 (H) 12/28/2023   HDL 79 12/28/2023   LDLCALC 105 (H) 12/28/2023   TRIG 89 12/28/2023   CHOLHDL 2.5 12/28/2023   Last hemoglobin A1c Lab Results  Component Value Date   HGBA1C 5.4 12/28/2023   Last thyroid  functions Lab Results  Component Value Date   TSH  1.60 09/22/2023   THYROIDAB 19 10/02/2023   Last vitamin D  Lab Results  Component Value Date   VD25OH 37.46 09/22/2023   Last vitamin B12 and Folate Lab Results  Component Value Date   VITAMINB12 510 12/28/2023   FOLATE 11.6 12/28/2023      The 10-year ASCVD risk score (Arnett DK, et al., 2019) is: 21.8%  Assessment & Plan:  Recurrent falls  Concern about memory     No follow-ups on file.   Benton LITTIE Gave, PA      [1] Social History Tobacco Use   Smoking status: Never   Smokeless tobacco: Never  Vaping Use   Vaping status: Never Used  Substance Use Topics   Alcohol use: Yes   Drug use: Never  "

## 2024-05-30 NOTE — Patient Instructions (Addendum)
 Please go to [address] and schedule the following: Bone density L wrist xray ( can be done today) MRI brain  Please continue all medications as ordered.  Please schedule PT   I would like to see you back in 3-4 weeks (all imaging studies must be completed prior to follow up)

## 2024-05-31 ENCOUNTER — Ambulatory Visit: Payer: Self-pay | Admitting: Urgent Care

## 2024-05-31 ENCOUNTER — Encounter: Payer: Self-pay | Admitting: Urgent Care

## 2024-05-31 DIAGNOSIS — Q283 Other malformations of cerebral vessels: Secondary | ICD-10-CM

## 2024-06-01 LAB — CMP14+EGFR
ALT: 18 IU/L (ref 0–32)
AST: 21 IU/L (ref 0–40)
Albumin: 4.7 g/dL (ref 3.8–4.8)
Alkaline Phosphatase: 72 IU/L (ref 49–135)
BUN/Creatinine Ratio: 17 (ref 12–28)
BUN: 15 mg/dL (ref 8–27)
Bilirubin Total: 0.3 mg/dL (ref 0.0–1.2)
CO2: 22 mmol/L (ref 20–29)
Calcium: 10 mg/dL (ref 8.7–10.3)
Chloride: 103 mmol/L (ref 96–106)
Creatinine, Ser: 0.9 mg/dL (ref 0.57–1.00)
Globulin, Total: 2.5 g/dL (ref 1.5–4.5)
Glucose: 90 mg/dL (ref 70–99)
Potassium: 4.5 mmol/L (ref 3.5–5.2)
Sodium: 143 mmol/L (ref 134–144)
Total Protein: 7.2 g/dL (ref 6.0–8.5)
eGFR: 67 mL/min/1.73

## 2024-06-01 LAB — AMMONIA: Ammonia: 50 ug/dL (ref 31–169)

## 2024-06-01 LAB — CBC WITH DIFFERENTIAL/PLATELET
Basophils Absolute: 0.1 x10E3/uL (ref 0.0–0.2)
Basos: 1 %
EOS (ABSOLUTE): 0.1 x10E3/uL (ref 0.0–0.4)
Eos: 2 %
Hematocrit: 39.3 % (ref 34.0–46.6)
Hemoglobin: 12.7 g/dL (ref 11.1–15.9)
Immature Grans (Abs): 0 x10E3/uL (ref 0.0–0.1)
Immature Granulocytes: 0 %
Lymphocytes Absolute: 2 x10E3/uL (ref 0.7–3.1)
Lymphs: 32 %
MCH: 31.7 pg (ref 26.6–33.0)
MCHC: 32.3 g/dL (ref 31.5–35.7)
MCV: 98 fL — ABNORMAL HIGH (ref 79–97)
Monocytes Absolute: 0.6 x10E3/uL (ref 0.1–0.9)
Monocytes: 10 %
Neutrophils Absolute: 3.5 x10E3/uL (ref 1.4–7.0)
Neutrophils: 55 %
Platelets: 384 x10E3/uL (ref 150–450)
RBC: 4.01 x10E6/uL (ref 3.77–5.28)
RDW: 12.8 % (ref 11.7–15.4)
WBC: 6.3 x10E3/uL (ref 3.4–10.8)

## 2024-06-01 LAB — HEMOGLOBIN A1C
Est. average glucose Bld gHb Est-mCnc: 117 mg/dL
Hgb A1c MFr Bld: 5.7 % — ABNORMAL HIGH (ref 4.8–5.6)

## 2024-06-01 LAB — MAGNESIUM: Magnesium: 2.3 mg/dL (ref 1.6–2.3)

## 2024-06-01 LAB — B12 AND FOLATE PANEL
Folate: 11.8 ng/mL
Vitamin B-12: 501 pg/mL (ref 232–1245)

## 2024-06-01 LAB — PARATHYROID HORMONE, INTACT (NO CA): PTH: 44 pg/mL (ref 15–65)

## 2024-06-06 ENCOUNTER — Other Ambulatory Visit: Payer: Self-pay | Admitting: Urgent Care

## 2024-06-06 ENCOUNTER — Ambulatory Visit

## 2024-06-06 DIAGNOSIS — Q283 Other malformations of cerebral vessels: Secondary | ICD-10-CM

## 2024-06-06 DIAGNOSIS — R4189 Other symptoms and signs involving cognitive functions and awareness: Secondary | ICD-10-CM | POA: Diagnosis not present

## 2024-06-06 DIAGNOSIS — R296 Repeated falls: Secondary | ICD-10-CM | POA: Diagnosis not present

## 2024-06-06 MED ORDER — GADOBUTROL 1 MMOL/ML IV SOLN
9.5000 mL | Freq: Once | INTRAVENOUS | Status: AC | PRN
  Administered 2024-06-06: 9.5 mL via INTRAVENOUS

## 2024-06-06 NOTE — Progress Notes (Signed)
 Neuro requested I re-route referral to neurosurgery.

## 2024-06-09 ENCOUNTER — Telehealth: Payer: Self-pay | Admitting: Neurosurgery

## 2024-06-09 NOTE — Telephone Encounter (Signed)
 Returned call to patient. No answer, I left a message with my call back info

## 2024-06-09 NOTE — Telephone Encounter (Signed)
 Patient is returning call from [place] and would like to schedule appt.

## 2024-06-14 ENCOUNTER — Telehealth: Admitting: Neurosurgery

## 2024-06-14 DIAGNOSIS — D1800 Hemangioma unspecified site: Secondary | ICD-10-CM

## 2024-06-14 NOTE — Progress Notes (Signed)
Pt did not join

## 2024-06-17 ENCOUNTER — Telehealth: Admitting: Neurosurgery

## 2024-06-17 DIAGNOSIS — D1802 Hemangioma of intracranial structures: Secondary | ICD-10-CM

## 2024-06-17 DIAGNOSIS — D1800 Hemangioma unspecified site: Secondary | ICD-10-CM

## 2024-06-17 NOTE — Progress Notes (Signed)
 Virtual Visit via Video Note  I connected with Morgan Potter on 06/17/24 at  4:20 PM EST by a video enabled telemedicine application and verified that I am speaking with the correct person using two identifiers.  Location: Patient: [facility] Provider: Clinic   I discussed the limitations of evaluation and management by telemedicine and the availability of in person appointments. The patient expressed understanding and agreed to proceed.  Discussed the use of AI scribe software for clinical note transcription with the patient, who gave verbal consent to proceed.  History of Present Illness Morgan Potter is a 74 year old female with a history of multiple traumatic fractures who presents for evaluation of recent memory loss.  Over the past year, she has sustained multiple falls resulting in significant injuries, including a left shoulder fracture, right arm fracture requiring metal plate placement, left knee injury, back fracture, and recurrent wrist fractures. She has modified her activities and her daughter has removed environmental hazards to reduce fall risk.  In the past few months, she has developed new-onset memory impairment characterized by word-finding difficulty and problems recalling information. She describes these cognitive symptoms as recent and bothersome. She reports that another medical professional mentioned lorazepam  as a possible contributor to cognitive changes. An MRI was performed last week to further evaluate her memory loss.  She also has a history of hypertension but otherwise feels generally healthy.  PLAN:  I am sorry to hear that she has had these 2 falls and is now having some memory problems which I do not suspect are related to the falls at all.  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  I do not think that this is related to any of her symptoms and she felt much better  after that.  She said that her family has a lot of vascular problems in her head and some have even had surgery for it but I told her that I do not suspect that she will ever need anything done for this.  The likelihood that this is going to cause any problems or have any hemorrhagic [bleeding] consequence from it is exceedingly low.  My standpoint she does not need any follow-up imaging and if there is ever anything I can do for her, she is welcome to call me.    I discussed the assessment and treatment plan with the patient. The patient was provided an opportunity to ask questions and all were answered. The patient agreed with the plan and demonstrated an understanding of the instructions.   The patient was advised to call back or seek an in-person evaluation if the symptoms worsen or if the condition fails to improve as anticipated.  I provided 15 minutes of non-face-to-face time during this encounter.   Lanice Folden, MD

## 2024-06-20 ENCOUNTER — Encounter: Payer: Self-pay | Admitting: Urgent Care

## 2024-06-20 ENCOUNTER — Ambulatory Visit: Admitting: Urgent Care

## 2024-06-20 DIAGNOSIS — Q283 Other malformations of cerebral vessels: Secondary | ICD-10-CM

## 2024-06-20 DIAGNOSIS — R296 Repeated falls: Secondary | ICD-10-CM | POA: Diagnosis not present

## 2024-06-20 DIAGNOSIS — D1802 Hemangioma of intracranial structures: Secondary | ICD-10-CM | POA: Diagnosis not present

## 2024-06-20 DIAGNOSIS — R4189 Other symptoms and signs involving cognitive functions and awareness: Secondary | ICD-10-CM | POA: Diagnosis not present

## 2024-06-22 ENCOUNTER — Other Ambulatory Visit: Payer: Self-pay

## 2024-06-22 DIAGNOSIS — I10 Essential (primary) hypertension: Secondary | ICD-10-CM

## 2024-06-22 MED ORDER — LOSARTAN POTASSIUM-HCTZ 50-12.5 MG PO TABS: 1.0000 | ORAL_TABLET | Freq: Every day | ORAL | 1 refills | Status: AC

## 2024-07-13 ENCOUNTER — Other Ambulatory Visit
